# Patient Record
Sex: Male | Born: 1955
Health system: Southern US, Community
[De-identification: ages and names within clinical notes are randomized; demographics above are authoritative.]

## PROBLEM LIST (undated history)

## (undated) DIAGNOSIS — H409 Unspecified glaucoma: Secondary | ICD-10-CM

## (undated) DIAGNOSIS — T7840XA Allergy, unspecified, initial encounter: Secondary | ICD-10-CM

## (undated) DIAGNOSIS — IMO0001 Reserved for inherently not codable concepts without codable children: Secondary | ICD-10-CM

## (undated) DIAGNOSIS — E119 Type 2 diabetes mellitus without complications: Secondary | ICD-10-CM

## (undated) DIAGNOSIS — K219 Gastro-esophageal reflux disease without esophagitis: Secondary | ICD-10-CM

## (undated) DIAGNOSIS — K5792 Diverticulitis of intestine, part unspecified, without perforation or abscess without bleeding: Secondary | ICD-10-CM

## (undated) DIAGNOSIS — K222 Esophageal obstruction: Secondary | ICD-10-CM

## (undated) DIAGNOSIS — F419 Anxiety disorder, unspecified: Secondary | ICD-10-CM

## (undated) DIAGNOSIS — Z8619 Personal history of other infectious and parasitic diseases: Secondary | ICD-10-CM

## (undated) DIAGNOSIS — E739 Lactose intolerance, unspecified: Secondary | ICD-10-CM

## (undated) DIAGNOSIS — G2581 Restless legs syndrome: Secondary | ICD-10-CM

## (undated) DIAGNOSIS — M1990 Unspecified osteoarthritis, unspecified site: Secondary | ICD-10-CM

## (undated) DIAGNOSIS — R51 Headache: Secondary | ICD-10-CM

## (undated) DIAGNOSIS — K760 Fatty (change of) liver, not elsewhere classified: Secondary | ICD-10-CM

## (undated) DIAGNOSIS — I10 Essential (primary) hypertension: Secondary | ICD-10-CM

## (undated) DIAGNOSIS — M542 Cervicalgia: Secondary | ICD-10-CM

## (undated) DIAGNOSIS — J189 Pneumonia, unspecified organism: Secondary | ICD-10-CM

## (undated) DIAGNOSIS — I2510 Atherosclerotic heart disease of native coronary artery without angina pectoris: Secondary | ICD-10-CM

## (undated) DIAGNOSIS — N200 Calculus of kidney: Secondary | ICD-10-CM

## (undated) DIAGNOSIS — I219 Acute myocardial infarction, unspecified: Secondary | ICD-10-CM

## (undated) DIAGNOSIS — E7800 Pure hypercholesterolemia, unspecified: Secondary | ICD-10-CM

## (undated) DIAGNOSIS — H269 Unspecified cataract: Secondary | ICD-10-CM

## (undated) HISTORY — DX: Anxiety disorder, unspecified: F41.9

## (undated) HISTORY — PX: OTHER SURGICAL HISTORY: SHX169

## (undated) HISTORY — PX: UPPER GASTROINTESTINAL ENDOSCOPY: SHX188

## (undated) HISTORY — PX: ESOPHAGEAL DILATION: SHX303

## (undated) HISTORY — PX: VASECTOMY: SHX75

## (undated) HISTORY — PX: INGUINAL HERNIA REPAIR: SUR1180

## (undated) HISTORY — DX: Unspecified glaucoma: H40.9

## (undated) HISTORY — DX: Allergy, unspecified, initial encounter: T78.40XA

## (undated) HISTORY — PX: PILONIDAL CYST EXCISION: SHX744

## (undated) HISTORY — DX: Personal history of other infectious and parasitic diseases: Z86.19

## (undated) HISTORY — DX: Unspecified osteoarthritis, unspecified site: M19.90

## (undated) HISTORY — PX: COLONOSCOPY: SHX174

## (undated) HISTORY — DX: Atherosclerotic heart disease of native coronary artery without angina pectoris: I25.10

## (undated) HISTORY — DX: Essential (primary) hypertension: I10

## (undated) HISTORY — DX: Esophageal obstruction: K22.2

## (undated) HISTORY — DX: Lactose intolerance, unspecified: E73.9

## (undated) HISTORY — DX: Type 2 diabetes mellitus without complications: E11.9

## (undated) HISTORY — DX: Reserved for inherently not codable concepts without codable children: IMO0001

## (undated) HISTORY — DX: Unspecified cataract: H26.9

## (undated) HISTORY — DX: Gastro-esophageal reflux disease without esophagitis: K21.9

## (undated) HISTORY — PX: EYE SURGERY: SHX253

## (undated) HISTORY — DX: Calculus of kidney: N20.0

---

## 1982-04-19 HISTORY — PX: TONSILLECTOMY: SUR1361

## 2001-04-19 HISTORY — PX: CORONARY ANGIOPLASTY: SHX604

## 2001-11-28 ENCOUNTER — Inpatient Hospital Stay (HOSPITAL_COMMUNITY): Admission: AD | Admit: 2001-11-28 | Discharge: 2001-11-29 | Payer: Self-pay | Admitting: Cardiology

## 2005-04-19 HISTORY — PX: POLYPECTOMY: SHX149

## 2005-07-22 ENCOUNTER — Ambulatory Visit: Payer: Self-pay | Admitting: Family Medicine

## 2005-07-29 ENCOUNTER — Ambulatory Visit: Payer: Self-pay | Admitting: Urology

## 2008-04-19 LAB — HM COLONOSCOPY

## 2008-08-15 ENCOUNTER — Inpatient Hospital Stay (HOSPITAL_COMMUNITY): Admission: RE | Admit: 2008-08-15 | Discharge: 2008-08-23 | Payer: Self-pay | Admitting: Cardiovascular Disease

## 2008-08-15 ENCOUNTER — Emergency Department: Payer: Self-pay | Admitting: Internal Medicine

## 2008-08-15 ENCOUNTER — Ambulatory Visit: Payer: Self-pay | Admitting: Cardiovascular Disease

## 2008-08-15 ENCOUNTER — Ambulatory Visit: Payer: Self-pay | Admitting: Thoracic Surgery (Cardiothoracic Vascular Surgery)

## 2008-08-16 HISTORY — PX: CORONARY ARTERY BYPASS GRAFT: SHX141

## 2008-08-17 DIAGNOSIS — IMO0001 Reserved for inherently not codable concepts without codable children: Secondary | ICD-10-CM

## 2008-08-17 HISTORY — DX: Reserved for inherently not codable concepts without codable children: IMO0001

## 2008-08-19 ENCOUNTER — Encounter: Payer: Self-pay | Admitting: Cardiology

## 2008-08-26 ENCOUNTER — Encounter: Payer: Self-pay | Admitting: Cardiovascular Disease

## 2008-08-26 ENCOUNTER — Ambulatory Visit: Payer: Self-pay | Admitting: Thoracic Surgery (Cardiothoracic Vascular Surgery)

## 2008-08-28 ENCOUNTER — Ambulatory Visit: Payer: Self-pay | Admitting: Thoracic Surgery (Cardiothoracic Vascular Surgery)

## 2008-08-28 ENCOUNTER — Encounter: Payer: Self-pay | Admitting: Cardiovascular Disease

## 2008-09-02 ENCOUNTER — Ambulatory Visit: Payer: Self-pay | Admitting: Thoracic Surgery (Cardiothoracic Vascular Surgery)

## 2008-09-03 ENCOUNTER — Encounter: Payer: Self-pay | Admitting: Cardiovascular Disease

## 2008-09-03 ENCOUNTER — Ambulatory Visit: Payer: Self-pay | Admitting: Cardiovascular Disease

## 2008-09-03 DIAGNOSIS — F17200 Nicotine dependence, unspecified, uncomplicated: Secondary | ICD-10-CM | POA: Insufficient documentation

## 2008-09-03 DIAGNOSIS — I25810 Atherosclerosis of coronary artery bypass graft(s) without angina pectoris: Secondary | ICD-10-CM | POA: Insufficient documentation

## 2008-09-03 DIAGNOSIS — E785 Hyperlipidemia, unspecified: Secondary | ICD-10-CM | POA: Insufficient documentation

## 2008-09-03 DIAGNOSIS — F528 Other sexual dysfunction not due to a substance or known physiological condition: Secondary | ICD-10-CM | POA: Insufficient documentation

## 2008-09-03 DIAGNOSIS — E739 Lactose intolerance, unspecified: Secondary | ICD-10-CM | POA: Insufficient documentation

## 2008-09-03 DIAGNOSIS — I2510 Atherosclerotic heart disease of native coronary artery without angina pectoris: Secondary | ICD-10-CM | POA: Insufficient documentation

## 2008-09-03 DIAGNOSIS — I25119 Atherosclerotic heart disease of native coronary artery with unspecified angina pectoris: Secondary | ICD-10-CM | POA: Insufficient documentation

## 2008-09-03 DIAGNOSIS — E1169 Type 2 diabetes mellitus with other specified complication: Secondary | ICD-10-CM | POA: Insufficient documentation

## 2008-09-12 ENCOUNTER — Telehealth: Payer: Self-pay | Admitting: Cardiovascular Disease

## 2008-09-18 ENCOUNTER — Encounter: Payer: Self-pay | Admitting: Cardiovascular Disease

## 2008-09-18 ENCOUNTER — Ambulatory Visit: Payer: Self-pay | Admitting: Thoracic Surgery (Cardiothoracic Vascular Surgery)

## 2008-10-02 ENCOUNTER — Encounter: Payer: Self-pay | Admitting: Cardiovascular Disease

## 2008-11-05 ENCOUNTER — Ambulatory Visit: Payer: Self-pay | Admitting: Cardiology

## 2008-11-06 ENCOUNTER — Telehealth: Payer: Self-pay | Admitting: Cardiovascular Disease

## 2008-11-07 ENCOUNTER — Encounter: Payer: Self-pay | Admitting: Cardiovascular Disease

## 2008-11-07 ENCOUNTER — Ambulatory Visit: Payer: Self-pay | Admitting: Internal Medicine

## 2008-11-12 LAB — CONVERTED CEMR LAB
ALT: 52 units/L (ref 0–53)
AST: 25 units/L (ref 0–37)
Albumin: 4.4 g/dL (ref 3.5–5.2)
Alkaline Phosphatase: 72 units/L (ref 39–117)
Bilirubin, Direct: 0.1 mg/dL (ref 0.0–0.3)
Cholesterol: 129 mg/dL (ref 0–200)
HDL: 29 mg/dL — ABNORMAL LOW (ref >39)
Total Bilirubin: 0.3 mg/dL (ref 0.3–1.2)
Total CHOL/HDL Ratio: 4.4

## 2008-11-29 ENCOUNTER — Encounter
Admission: RE | Admit: 2008-11-29 | Discharge: 2008-11-29 | Payer: Self-pay | Admitting: Thoracic Surgery (Cardiothoracic Vascular Surgery)

## 2008-11-29 ENCOUNTER — Ambulatory Visit: Payer: Self-pay | Admitting: Thoracic Surgery (Cardiothoracic Vascular Surgery)

## 2008-12-03 ENCOUNTER — Encounter: Payer: Self-pay | Admitting: Cardiovascular Disease

## 2008-12-10 ENCOUNTER — Ambulatory Visit: Payer: Self-pay | Admitting: Cardiovascular Disease

## 2008-12-10 DIAGNOSIS — G479 Sleep disorder, unspecified: Secondary | ICD-10-CM

## 2008-12-10 DIAGNOSIS — F39 Unspecified mood [affective] disorder: Secondary | ICD-10-CM | POA: Insufficient documentation

## 2008-12-13 ENCOUNTER — Telehealth: Payer: Self-pay | Admitting: Cardiology

## 2009-01-08 ENCOUNTER — Telehealth (INDEPENDENT_AMBULATORY_CARE_PROVIDER_SITE_OTHER): Payer: Self-pay | Admitting: *Deleted

## 2009-01-13 ENCOUNTER — Encounter: Payer: Self-pay | Admitting: Cardiovascular Disease

## 2009-01-13 ENCOUNTER — Ambulatory Visit: Payer: Self-pay | Admitting: Thoracic Surgery (Cardiothoracic Vascular Surgery)

## 2009-01-13 ENCOUNTER — Encounter
Admission: RE | Admit: 2009-01-13 | Discharge: 2009-01-13 | Payer: Self-pay | Admitting: Thoracic Surgery (Cardiothoracic Vascular Surgery)

## 2009-01-23 LAB — HM COLONOSCOPY

## 2009-03-18 ENCOUNTER — Ambulatory Visit: Payer: Self-pay | Admitting: Cardiovascular Disease

## 2009-03-18 DIAGNOSIS — R079 Chest pain, unspecified: Secondary | ICD-10-CM | POA: Insufficient documentation

## 2009-03-19 ENCOUNTER — Encounter: Payer: Self-pay | Admitting: Cardiovascular Disease

## 2009-03-19 HISTORY — PX: STERNUM SEPARATION REPAIR: SHX393

## 2009-03-27 ENCOUNTER — Encounter: Payer: Self-pay | Admitting: Cardiovascular Disease

## 2009-03-28 ENCOUNTER — Encounter: Payer: Self-pay | Admitting: Cardiovascular Disease

## 2009-03-30 ENCOUNTER — Encounter: Payer: Self-pay | Admitting: Cardiovascular Disease

## 2009-04-01 ENCOUNTER — Telehealth: Payer: Self-pay | Admitting: Cardiovascular Disease

## 2009-04-04 ENCOUNTER — Encounter: Payer: Self-pay | Admitting: Cardiovascular Disease

## 2009-04-28 ENCOUNTER — Ambulatory Visit: Payer: Self-pay | Admitting: Cardiovascular Disease

## 2009-04-28 DIAGNOSIS — I2589 Other forms of chronic ischemic heart disease: Secondary | ICD-10-CM | POA: Insufficient documentation

## 2009-04-28 DIAGNOSIS — I10 Essential (primary) hypertension: Secondary | ICD-10-CM | POA: Insufficient documentation

## 2009-05-05 ENCOUNTER — Telehealth: Payer: Self-pay | Admitting: Cardiovascular Disease

## 2009-05-16 ENCOUNTER — Ambulatory Visit: Payer: Self-pay | Admitting: Cardiology

## 2009-05-16 ENCOUNTER — Encounter: Payer: Self-pay | Admitting: Cardiovascular Disease

## 2009-05-19 ENCOUNTER — Telehealth: Payer: Self-pay | Admitting: Cardiovascular Disease

## 2009-05-19 LAB — CONVERTED CEMR LAB
CO2: 26 meq/L (ref 19–32)
Calcium: 9.1 mg/dL (ref 8.4–10.5)
Creatinine, Ser: 1.2 mg/dL (ref 0.40–1.50)
Glucose, Bld: 102 mg/dL — ABNORMAL HIGH (ref 70–99)

## 2009-05-29 ENCOUNTER — Telehealth: Payer: Self-pay | Admitting: Cardiovascular Disease

## 2009-07-30 ENCOUNTER — Telehealth: Payer: Self-pay | Admitting: Cardiovascular Disease

## 2009-08-08 ENCOUNTER — Emergency Department: Payer: Self-pay | Admitting: Emergency Medicine

## 2009-08-08 ENCOUNTER — Telehealth: Payer: Self-pay | Admitting: Nurse Practitioner

## 2009-08-09 ENCOUNTER — Ambulatory Visit: Payer: Self-pay | Admitting: Internal Medicine

## 2009-08-09 ENCOUNTER — Observation Stay (HOSPITAL_COMMUNITY): Admission: EM | Admit: 2009-08-09 | Discharge: 2009-08-11 | Payer: Self-pay | Admitting: Internal Medicine

## 2009-08-19 ENCOUNTER — Ambulatory Visit: Payer: Self-pay

## 2009-09-09 ENCOUNTER — Ambulatory Visit: Payer: Self-pay | Admitting: Cardiovascular Disease

## 2010-04-22 ENCOUNTER — Encounter: Payer: Self-pay | Admitting: Cardiovascular Disease

## 2010-05-19 NOTE — Letter (Signed)
Summary: [facility]  [facility]   Imported By: Harlon Flor 04/24/2009 10:21:26  _____________________________________________________________________  External Attachment:    Type:   Image     Comment:   External Document

## 2010-05-19 NOTE — Progress Notes (Signed)
Summary: FYI  Phone Note Call from Patient Call back at Home Phone [phone]   Caller: SELF Call For: [facility] Summary of Call: PT TOOK LAST LISINOPRIL TODAY Initial call taken by: Harlon Flor,  May 29, 2009 10:26 AM    New/Updated Medications: COZAAR 50 MG TABS (LOSARTAN POTASSIUM) 1 tab by mouth daily Prescriptions: COZAAR 50 MG TABS (LOSARTAN POTASSIUM) 1 tab by mouth daily  #30 x 6   Entered by:   Charlena Cross, RN, BSN   Authorized by:   Verne Carrow, MD   Signed by:   Charlena Cross, RN, BSN on 05/29/2009   Method used:   Electron[facility] to        [id number]       [address]       [place], [place]  [address]       Ph: [phone]       Fax: [phone]   RxID:   [id number]

## 2010-05-19 NOTE — Progress Notes (Signed)
Summary: QUESTIONS  Phone Note Call from Patient Call back at Home Phone [phone]   Caller: SELF Call For: [facility] Summary of Call: PT PURCHASED MINOXIDIL-IS THIS OK FOR HIM TO TAKE Initial call taken by: Harlon Flor,  July 30, 2009 1:54 PM  Follow-up for Phone Call        per DB- yes he can.  Pt aware.  Follow-up by: Charlena Cross, RN, BSN,  July 30, 2009 3:31 PM

## 2010-05-19 NOTE — Progress Notes (Signed)
Summary: Cardiology Phone Note - "Nervousness in the Chest"  Phone Note Call from Patient   Summary of Call: pts wife called this evening stating that pt c/o of nervous feeling in the chest.  i then spoke with him and he reported that its more like a tightness.  he also says that earlier he felt like he couldn't 'get a good breath.'  when asked if ss now are similar to ss at time of his mi, he says not really in that then he had radiation to his arm and severe c/p.  i advised that because he has ongoing ss, he should be seen in his local ed ([facility]) if for nothing else, reassurance.  he's going to discuss it with his wife and likely go to [facility] tonight. Initial call taken by: Creig Hines, ANP-BC,  August 08, 2009 8:56 PM

## 2010-05-19 NOTE — Op Note (Signed)
Summary: Operative Report  Operative Report   Imported By: Harlon Flor 04/24/2009 10:22:10  _____________________________________________________________________  External Attachment:    Type:   Image     Comment:   External Document

## 2010-05-19 NOTE — Progress Notes (Signed)
Summary: DENTAL WORK  Phone Note Call from Patient Call back at Home Phone [phone]   Caller: SELF Call For: [facility] Summary of Call: DOES PT NEED TO BE PRE MEDICATED FOR A DENTAL CLEANING OR A PERIODONTAL CLEANING Initial call taken by: Harlon Flor,  May 05, 2009 11:23 AM  Follow-up for Phone Call        PT HAS AN APPT TOMORROW WITH AN EYE DOCTOR-IS THERE ANY PROBLEM WITH THE DOCTOR DIALATING HIS EYES? Follow-up by: Harlon Flor,  May 06, 2009 11:45 AM  Additional Follow-up for Phone Call Additional follow up Details #1::        no need for abt prior to dental work Additional Follow-up by: Charlena Cross, RN, BSN,  May 12, 2009 2:32 PM

## 2010-05-19 NOTE — Assessment & Plan Note (Signed)
Summary: F3W/AMD   Visit Type:  Follow-up Primary Provider:  Dorothey Baseman, MD  CC:  chest pain from surgery.  History of Present Illness: 55 yo WM with history of CAD s/p 4V CABG, borderline DM, GERD and nephrolithiasis admitted to [facility] 08/15/08 with an acute anterior STEMI. He presented to the [facility] ED with c/o of chest pain and was found to have anterior ST segment elevation.Emergent cath demonstrated a patent prior stent in the ostial LAD extending into the mid LAD. There was a hazy 99% stenosis in the mid LAD that was felt to be the culprit. PCI failed after multiple attemps and the pt was taken emergently to the operating room for bypass.   Emergent CABG was performed by Dr. Dorris Fetch with 4 vessels bypassed (LIMA to LAD, SVG to D2, SVG to OM2, SVG to distal RCA).  Since surgery, he has had problems with atypical chest pains, sternal wound pain and insomnia.  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 Dorris Fetch. He sought a second opinion at [facility] and underwent sternotomy with removal of sternal wires and revision by Dr. Ty Hilts on 03/28/09. He has done well since then. Pre-operatively at [facility], he had a stress echo which showed segmental LV dysfunction with baseline EF of 40%, no stress induced ichemia. CTA showed 2/4 patent grafts (patent LIMA to LAD and patent SVG to OM. The SVG to RCA was occluded and the SVG to the Diagonal was occluded).    He returns today for follow up.  He has had no recurrence of chest pains since his sternal non-union was treated. He has had no SOB, palpitations or lower ext edema. He feels well at this time.   Current Medications (verified): 1)  Omeprazole 40 Mg Cpdr (Omeprazole) .Marland Kitchen.. 1 Tab Once Daily 2)  Metoprolol Succinate 50 Mg Xr24h-Tab (Metoprolol Succinate) .Marland Kitchen.. 1 Tab Once Daily 3)  Aspirin 81 .... Take One Tablet By Mouth Daily 4)  Pravastatin Sodium 40 Mg Tabs (Pravastatin Sodium) .Marland Kitchen.. 1 Tab At Once Daily 5)   Ibuprofen 200 Mg Tabs (Ibuprofen) .... As Needed 6)  Trazodone Hcl 50 Mg Tabs (Trazodone Hcl) .... One Tab By Mouth At Bedtime As Needed Sleep 7)  Alprazolam 0.5 Mg Tabs (Alprazolam) .... Half of Tab 0.25 8)  Nitroglycerin 0.4 Mg Subl (Nitroglycerin) .... One Tablet Under Tongue Every 5 Minutes As Needed For Chest Pain---May Repeat Times Three 9)  Tums 500 Mg Chew (Calcium Carbonate Antacid) .Marland Kitchen.. 1 Daliy As Needed 10)  Lovaza 1 Gm Caps (Omega-3-Acid Ethyl Esters) .... 3-4  Tabs By Mouth Daily  Allergies (verified): 1)  ! * Ambien 2)  ! Lamisil (Terbinafine Hcl)  Past History:  Past Medical History: CAD s/p emergent 4 V CABG (4/10) with 2/4 patent grafts by CTA at [facility] GLUCOSE INTOLERANCE (ICD-271.3) ERECTILE DYSFUNCTION (ICD-302.72) GERD Nephrolithiasis Schatzkes ring Echo (5/10): EF 60%, apical and apical lateral HK, PASP 43 mmHg.  Repeat at [facility] with reported EF of 40%. Chest pain secondary to sternal non-union post CABG-now s/p sternotomy with repair 03/28/09 at [facility], Dr.                  Ty Hilts HTN  Social History: Reviewed history from 11/05/2008 and no changes required. Full Time Married, 3 children Tobacco Use-history but no smoking since bypass in 4/10 Drug Use - no Etoh used socially  Review of Systems  The patient denies fatigue, malaise, fever, weight gain/loss, vision loss, decreased hearing, hoarseness,  chest pain, palpitations, shortness of breath, prolonged cough, wheezing, sleep apnea, coughing up blood, abdominal pain, blood in stool, nausea, vomiting, diarrhea, heartburn, incontinence, blood in urine, muscle weakness, joint pain, leg swelling, rash, skin lesions, headache, fainting, dizziness, depression, anxiety, enlarged lymph nodes, easy bruising or bleeding, and environmental allergies.    Vital Signs:  Patient profile:   55 year old male Weight:      179.75 pounds Pulse rate:   68 / minute Pulse rhythm:   regular BP sitting:   149 / 90  (left  arm) Cuff size:   regular  Vitals Entered By: Charlena Cross, RN, BSN (April 28, 2009 3:29 PM)  Physical Exam  General:  General: Well developed, well nourished, NAD Musculoskeletal: Muscle strength 5/5 all ext. Chest wall tender to palpation. Sternal wound well healed. Psychiatric: Mood and affect normal Neck: No JVD, no carotid bruits, no thyromegaly, no lymphadenopathy. Lungs:Clear bilaterally, no wheezes, rhonci, crackles CV: RRR no murmurs, gallops rubs Abdomen: soft, NT, ND, BS present Extremities: No edema, pulses 2+.    Impression & Recommendations:  Problem # 1:  CAD, ARTERY BYPASS GRAFT (ICD-414.04) CTA at [facility] with 2/4 patent grafts. The graft to the RCA is down however the RCA only has moderate disease. The graft to the diagonal is also down. He has no angina. Continue present medical therapy.  His updated medication list for this problem includes:    Metoprolol Succinate 50 Mg Xr24h-tab (Metoprolol succinate) .Marland Kitchen... 1 tab once daily    Nitroglycerin 0.4 Mg Subl (Nitroglycerin) ..... One tablet under tongue every 5 minutes as needed for chest pain---may repeat times three    Lisinopril 5 Mg Tabs (Lisinopril) .Marland Kitchen... Take one tablet by mouth daily  His updated medication list for this problem includes:    Metoprolol Succinate 50 Mg Xr24h-tab (Metoprolol succinate) .Marland Kitchen... 1 tab once daily    Nitroglycerin 0.4 Mg Subl (Nitroglycerin) ..... One tablet under tongue every 5 minutes as needed for chest pain---may repeat times three  Problem # 2:  CARDIOMYOPATHY, ISCHEMIC (ICD-414.8) The echo here in May suggested wall motion abnormality with preserved EF but the echo at the outside hospital suggested that the EF was 40%. Will start Lisinopril which will be helpful for his blood pressure as well. Will repeat echo 3-4 months. Volume status ok.   His updated medication list for this problem includes:    Metoprolol Succinate 50 Mg Xr24h-tab (Metoprolol succinate) .Marland Kitchen... 1 tab  once daily    Nitroglycerin 0.4 Mg Subl (Nitroglycerin) ..... One tablet under tongue every 5 minutes as needed for chest pain---may repeat times three    Lisinopril 5 Mg Tabs (Lisinopril) .Marland Kitchen... Take one tablet by mouth daily  Orders: Echocardiogram (Echo)  Problem # 3:  HYPERTENSION, BENIGN (ICD-401.1) Will add Lisinopril. Check BMET two weeks.   His updated medication list for this problem includes:    Metoprolol Succinate 50 Mg Xr24h-tab (Metoprolol succinate) .Marland Kitchen... 1 tab once daily    Lisinopril 5 Mg Tabs (Lisinopril) .Marland Kitchen... Take one tablet by mouth daily  His updated medication list for this problem includes:    Metoprolol Succinate 50 Mg Xr24h-tab (Metoprolol succinate) .Marland Kitchen... 1 tab once daily    Lisinopril 5 Mg Tabs (Lisinopril) .Marland Kitchen... Take one tablet by mouth daily  Patient Instructions: 1)  Your physician recommends that you schedule a follow-up appointment in: 4 months 2)  Your physician recommends that you return for lab work in: 2 weeks (BMET) 3)  Your physician has recommended you make  the following change in your medication: start lisinopril 5 mg daily 4)  Your physician has requested that you have an echocardiogram.  Echocardiography is a painless test that uses sound waves to create images of your heart. It provides your doctor with information about the size and shape of your heart and how well your heart's chambers and valves are working.  This procedure takes approximately one hour. There are no restrictions for this procedure. In 4 months Prescriptions: NITROGLYCERIN 0.4 MG SUBL (NITROGLYCERIN) One tablet under tongue every 5 minutes as needed for chest pain---may repeat times three  #20 x 6   Entered by:   Charlena Cross, RN, BSN   Authorized by:   Verne Carrow, MD   Signed by:   Charlena Cross, RN, BSN on 04/28/2009   Method used:   [facility]        [id number]       [address]       [place], [place]  [address]        Ph: [phone]       Fax: [phone]   RxID:   [id number] LISINOPRIL 5 MG TABS (LISINOPRIL) Take one tablet by mouth daily  #30 x 6   Entered by:   Charlena Cross, RN, BSN   Authorized by:   Verne Carrow, MD   Signed by:   Charlena Cross, RN, BSN on 04/28/2009   Method used:   [facility]ically [facility]        [id number]       [address]       [place], [place]  [address]       Ph: [phone]       Fax: [phone]   RxID:   [id number]

## 2010-05-19 NOTE — Assessment & Plan Note (Signed)
Summary: eph/jml   Visit Type:  Post-hospital Primary Provider:  Dorothey Baseman, MD  CC:  No com plains.  History of Present Illness: 55 yo WM with history of CAD s/p 4V CABG, borderline DM, GERD and nephrolithiasis admitted to [facility] 08/15/08 with an acute anterior STEMI. He presented to the [facility] ED with c/o of chest pain and was found to have anterior ST segment elevation.Emergent cath demonstrated a patent prior stent in the ostial LAD extending into the mid LAD. There was a hazy 99% stenosis in the mid LAD that was felt to be the culprit. PCI failed after multiple attemps and the pt was taken emergently to the operating room for bypass.   Emergent CABG was performed by Dr. Dorris Fetch with 4 vessels bypassed (LIMA to LAD, SVG to D2, SVG to OM2, SVG to distal RCA).  Since surgery, he has had problems with atypical chest pains, sternal wound pain and insomnia.  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 Dorris Fetch. He sought a second opinion at [facility] and underwent sternotomy with removal of sternal wires and revision by Dr. Ty Hilts on 03/28/09. He has done well since then. Pre-operatively at [facility], he had a stress echo which showed segmental LV dysfunction with baseline EF of 40%, no stress induced ichemia. CTA showed 2/4 patent grafts (patent LIMA to LAD and patent SVG to OM. The SVG to RCA was occluded and the SVG to the Diagonal was occluded).   He was admitted to [facility] 08/09/09 to 08/11/09 with chest pain. Cardiac cath repeated and showed patent LIMA to LAD, patent SVG to OM with occluded SVG to Diagonal and SVG to RCA. EF was 45%. End diastolic pressure normal.   He is here today for hospital follow up. He has been doing well. He has been walikng 2 miles per day without chest pain or SOB.   Current Medications (verified): 1)  Omeprazole 40 Mg Cpdr (Omeprazole) .Marland Kitchen.. 1 Tab Once Daily 2)  Metoprolol Succinate 50 Mg Xr24h-Tab (Metoprolol  Succinate) .Marland Kitchen.. 1 Tab Once Daily 3)  Pravastatin Sodium 40 Mg Tabs (Pravastatin Sodium) .Marland Kitchen.. 1 Tab At Once Daily 4)  Ibuprofen 200 Mg Tabs (Ibuprofen) .... As Needed 5)  Trazodone Hcl 50 Mg Tabs (Trazodone Hcl) .... One Tab By Mouth At Bedtime As Needed Sleep 6)  Alprazolam 0.5 Mg Tabs (Alprazolam) .... Half of Tab 0.25 7)  Nitroglycerin 0.4 Mg Subl (Nitroglycerin) .... One Tablet Under Tongue Every 5 Minutes As Needed For Chest Pain---May Repeat Times Three 8)  Tums 500 Mg Chew (Calcium Carbonate Antacid) .Marland Kitchen.. 1 Daliy As Needed 9)  Lovaza 1 Gm Caps (Omega-3-Acid Ethyl Esters) .... 3-4  Tabs By Mouth Daily 10)  Cozaar 50 Mg Tabs (Losartan Potassium) .Marland Kitchen.. 1 Tab By Mouth Daily 11)  Aspirin 81 Mg Tbec (Aspirin) .... Take One Tablet By Mouth Daily 12)  Lemon Balm 99 % Powd (Lemon Balm) .... 800mg  Daily  Allergies: 1)  ! * Ambien 2)  ! Lamisil (Terbinafine Hcl) 3)  ! Lisinopril  Past History:  Past Medical History: CAD s/p emergent 4 V CABG (4/10) with 2/4 patent grafts by cath 08/11/09, patent LIMA to LAD and SVG to OM with moderate disease native RCA and occluded grafts to RCA and Diagonal. GLUCOSE INTOLERANCE (ICD-271.3) ERECTILE DYSFUNCTION (ICD-302.72) GERD Nephrolithiasis Schatzkes ring Echo (5/10): EF 60%, apical and apical lateral HK, PASP 43 mmHg.  Repeat at [facility] with reported EF of 40%. Chest pain secondary  to sternal non-union post CABG-now s/p sternotomy with repair 03/28/09 at [facility], Dr.                  Ty Hilts HTN  Social History: Reviewed history from 11/05/2008 and no changes required. Full Time Married, 3 children Tobacco Use-history but no smoking since bypass in 4/10 Drug Use - no Etoh used socially  Review of Systems  The patient denies fatigue, malaise, fever, weight gain/loss, vision loss, decreased hearing, hoarseness, chest pain, palpitations, shortness of breath, prolonged cough, wheezing, sleep apnea, coughing up blood, abdominal pain, blood in stool,  nausea, vomiting, diarrhea, heartburn, incontinence, blood in urine, muscle weakness, joint pain, leg swelling, rash, skin lesions, headache, fainting, dizziness, depression, anxiety, enlarged lymph nodes, easy bruising or bleeding, and environmental allergies.    Vital Signs:  Patient profile:   55 year old male Height:      70 inches Weight:      179 pounds BMI:     25.78 Pulse rate:   64 / minute Pulse rhythm:   regular Resp:     18 per minute BP sitting:   103 / 63  (right arm) Cuff size:   large  Vitals Entered By: Vikki Ports (Sep 09, 2009 9:08 AM)  Physical Exam  General:  General: Well developed, well nourished, NAD HEENT: OP clear, mucus membranes moist Psychiatric: Mood and affect normal Neck: No JVD, no carotid bruits, no thyromegaly, no lymphadenopathy. Lungs:Clear bilaterally, no wheezes, rhonci, crackles CV: RRR no murmurs, gallops rubs Abdomen: soft, NT, ND, BS present Extremities: No edema, pulses 2+.    Cardiac Cath  Procedure date:  08/11/2009  Findings:       Left main coronary artery had a 20% eccentric stenosis with a patent   stent through the left main and proximal part of the LAD.  The LAD   proper was 100% occluded proximally.  There were some septal   perforators.  Circumflex coronary artery was 100% occluded proximally.      Right coronary artery was dominant.  There was 50% lesion proximally.   This was preceded by an aneurysmal segment.  There was 20-30% multiple   discrete midvessel lesions.  There was a 40% distal tubular lesion prior   to takeoff of the PDA.  PDA and PLA were normal.  There were no right-to-   left collaterals.  The saphenous vein graft to obtuse marginal branch   was widely patent, did appear to feet 3 diffusely diseased branches.   The saphenous vein graft to the RCA and diagonal were injected and known   to be occluded.  The left internal mammary artery was widely patent to   the LAD.   Echocardiogram  Procedure  date:  08/19/2009  Findings:      Left ventricle: The cavity size was normal. Wall thickness was       increased in a pattern of mild LVH. Systolic function was       borderline reduced. The estimated ejection fraction was in the       range of 50% to 55%. There was hypokinesis of the apex, apical       septum, apical anterior wall, and apical lateral wall. Features       are consistent with a pseudonormal left ventricular filling       pattern, with concomitant abnormal relaxation and increased       filling pressure (grade 2 diastolic dysfunction).     - Aortic valve: There  was no stenosis. Trivial regurgitation.     - Mitral valve: Trivial regurgitation.     - Left atrium: The atrium was at the upper limits of normal in size.     - Right ventricle: The cavity size was normal. Systolic function was       normal.     - Pulmonary arteries: PA peak pressure: 35mm Hg (S).     - Inferior vena cava: The vessel was normal in size; the       respirophasic diameter changes were in the normal range (= 50%);       findings are consistent with normal central venous pressure.  EKG  Procedure date:  09/09/2009  Findings:      NSR, rate 65 bpm. Non-specific T wave changes lateral and anterolateral leads.   Impression & Recommendations:  Problem # 1:  CAD, ARTERY BYPASS GRAFT (ICD-414.04) Stable. Continue current therapy. No medication changes. He will use his NTG as needed.   His updated medication list for this problem includes:    Metoprolol Succinate 50 Mg Xr24h-tab (Metoprolol succinate) .Marland Kitchen... 1 tab once daily    Nitroglycerin 0.4 Mg Subl (Nitroglycerin) ..... One tablet under tongue every 5 minutes as needed for chest pain---may repeat times three    Aspirin 81 Mg Tbec (Aspirin) .Marland Kitchen... Take one tablet by mouth daily  Orders: EKG w/ Interpretation (93000)  Patient Instructions: 1)  Your physician recommends that you schedule a follow-up appointment in: 6 months 2)  Your physician  recommends that you continue on your current medications as directed. Please refer to the Current Medication list given to you today.

## 2010-05-19 NOTE — Progress Notes (Signed)
Summary: lisinopril side effects  Phone Note Outgoing Call   Summary of Call: called pt to review labwork. spoke with wife.  wife states that since starting lisinopril, pt has developed a mild cough.  states that pt will only cough 2-3 times a day.  instructed pt that this was a common side effect of the medication and that I would contact you to discuss medication regimine.  wife stated that pt is not bothered by cough but will change medication if you want him to.  Initial call taken by: Charlena Cross, RN, BSN,  May 19, 2009 11:56 AM  Follow-up for Phone Call        His cough may be related to the Lisinopril. I would like for him to continue the Lisinopril until he is almost done with this bottle and if his cough persists, at that time, he should call us and then we  can change him to Cozaar 50 mg by mouth Qdaily. If his cough is related to something viral, it should not persist beyond the next week even on the Lisinopril.  The Cozaar  should not have the same side effect of cough. thanks, cdm Follow-up by: Verne Carrow, MD,  May 19, 2009 12:01 PM  Additional Follow-up for Phone Call Additional follow up Details #1::        pt aware. has 10 days of lisinopril left.  will call to let us know about cough.  Charlena Cross RN BSN

## 2010-06-06 IMAGING — CR DG CHEST 1V PORT
1 series · 1 of 1 positions shown · non-contrast
Comparison: none

REASON FOR EXAM: Chest Pain
COMMENTS:

[view not recorded]
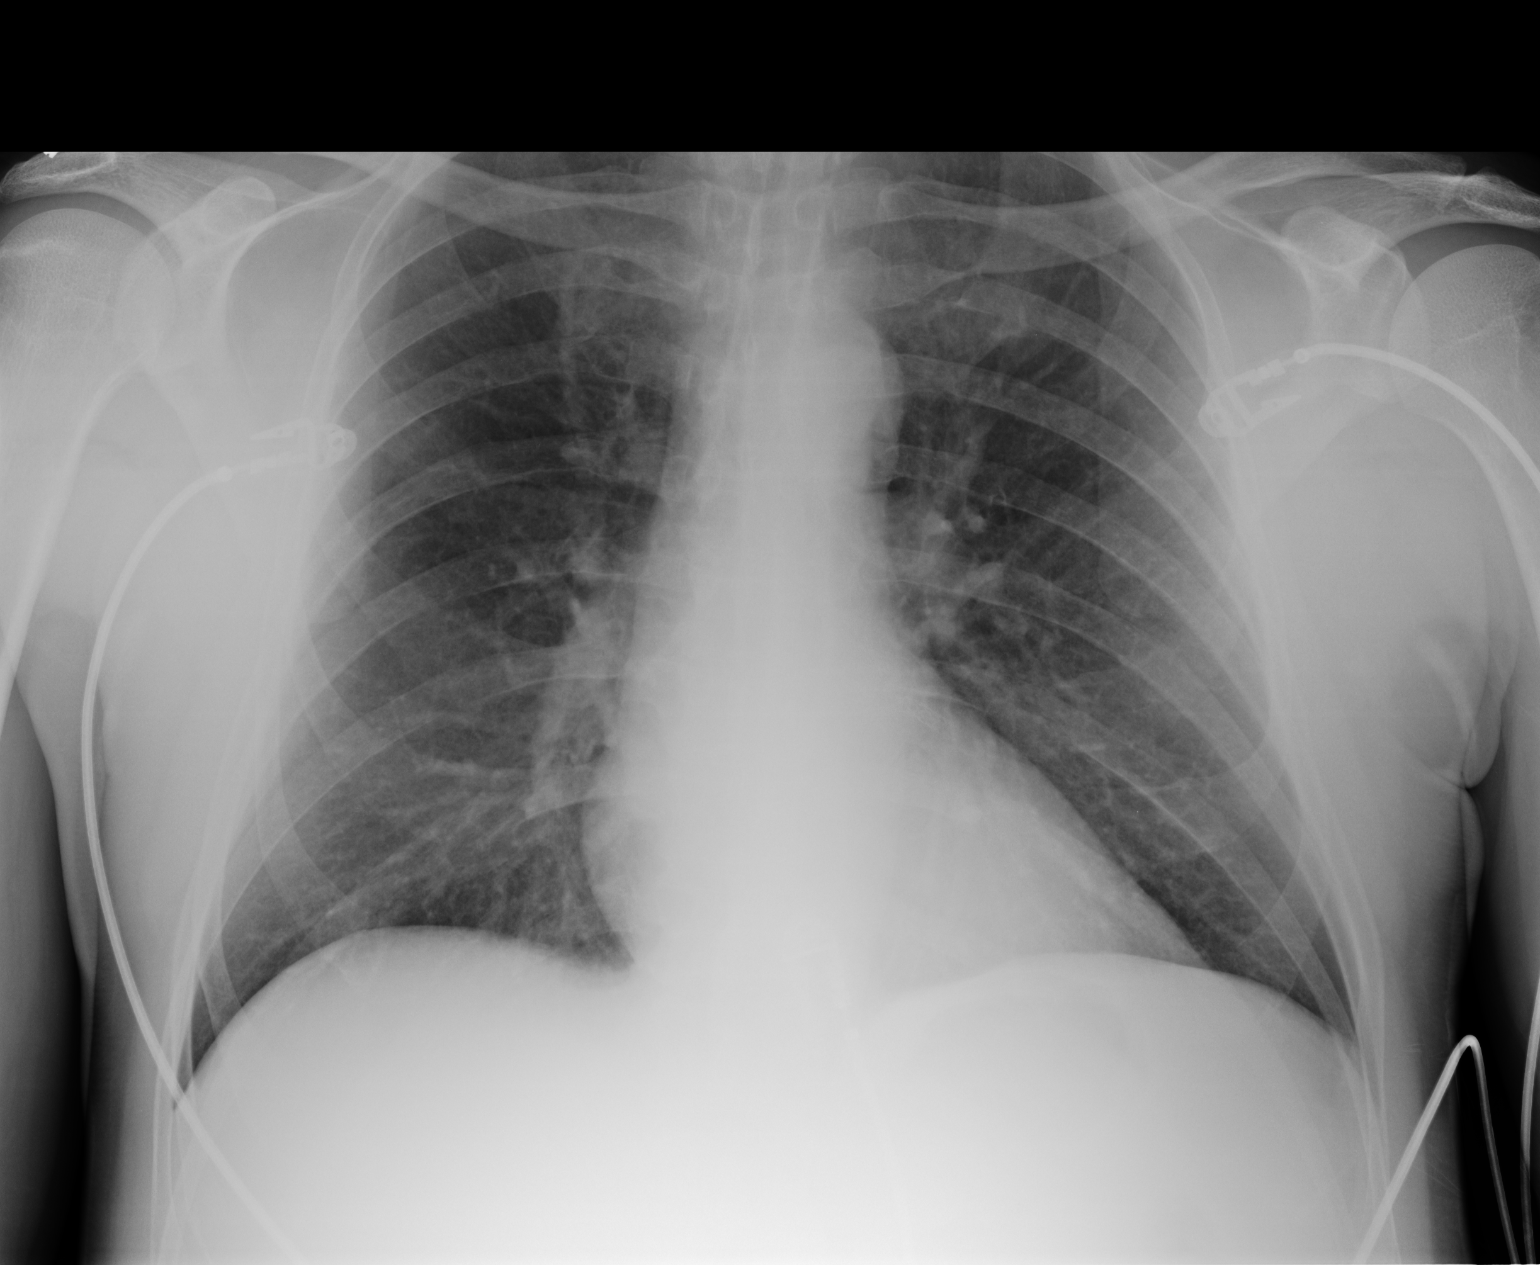

[1 of 1 positions shown; findings below may reference images not displayed]

PROCEDURE:     DXR - DXR PORTABLE CHEST SINGLE VIEW  - August 15, 2008  [DATE]

RESULT:     There is no previous exam for comparison.

Cardiac monitoring electrodes are present. The lungs are clear. The heart
and pulmonary vessels are normal. The bony and mediastinal structures are
unremarkable. There is no effusion. There is no pneumothorax or evidence of
congestive failure.
IMPRESSION: No acute cardiopulmonary disease.

## 2010-06-08 ENCOUNTER — Encounter: Payer: Self-pay | Admitting: Cardiovascular Disease

## 2010-06-08 ENCOUNTER — Ambulatory Visit (INDEPENDENT_AMBULATORY_CARE_PROVIDER_SITE_OTHER): Payer: 59 | Admitting: Cardiovascular Disease

## 2010-06-08 DIAGNOSIS — I25810 Atherosclerosis of coronary artery bypass graft(s) without angina pectoris: Secondary | ICD-10-CM

## 2010-06-08 DIAGNOSIS — E7800 Pure hypercholesterolemia, unspecified: Secondary | ICD-10-CM

## 2010-06-16 NOTE — Assessment & Plan Note (Signed)
Summary: rov.gd /rm per pt wife call. gd /rm   Visit Type:  Follow-up Primary Provider:  Dorothey Baseman, MD  CC:  some chest discomfort. pt has 102.0 fever this morning. body aches and ear ache. coughing.Marland Kitchen  History of Present Illness: 55 yo WM with history of CAD s/p 4V CABG, borderline DM, GERD and nephrolithiasis admitted to [facility] 08/15/08 with an acute anterior STEMI. He presented to the [facility] ED with c/o of chest pain and was found to have anterior ST segment elevation.Emergent cath demonstrated a patent prior stent in the ostial LAD extending into the mid LAD. There was a hazy 99% stenosis in the mid LAD that was felt to be the culprit. PCI failed after multiple attempts and the pt was taken emergently to the operating room for bypass.   Emergent CABG was performed by Dr. Dorris Fetch with 4 vessels bypassed (LIMA to LAD, SVG to D2, SVG to OM2, SVG to distal RCA).  Since surgery, he has had problems with atypical chest pains, sternal wound pain and insomnia.  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 Dorris Fetch. He sought a second opinion at [facility] and underwent sternotomy with removal of sternal wires and revision by Dr. Ty Hilts on 03/28/09. He has done well since then. Pre-operatively at [facility], he had a stress echo which showed segmental LV dysfunction with baseline EF of 40%, no stress induced ichemia. CTA showed 2/4 patent grafts (patent LIMA to LAD and patent SVG to OM. The SVG to RCA was occluded and the SVG to the Diagonal was occluded).   He was admitted to [facility] 08/09/09 to 08/11/09 with chest pain. Cardiac cath repeated and showed patent LIMA to LAD, patent SVG to OM with occluded SVG to Diagonal and SVG to RCA. EF was 45%. End diastolic pressure normal.   He is here today for follow up. He has been doing well. He has some pain in his sternum with movement post sternal repair. No exertional chest pressure. He is back at work. HIs  breathing has been ok. He woke up with a fever today of 102 degrees. No cough. His ear hurt yesterday. He also has lower back pain but no dysuria.   Current Medications (verified): 1)  Omeprazole 40 Mg Cpdr (Omeprazole) .Marland Kitchen.. 1 Tab Once Daily 2)  Metoprolol Succinate 50 Mg Xr24h-Tab (Metoprolol Succinate) .Marland Kitchen.. 1 Tab Once Daily 3)  Pravastatin Sodium 40 Mg Tabs (Pravastatin Sodium) .Marland Kitchen.. 1 Tab At Once Daily 4)  Ibuprofen 200 Mg Tabs (Ibuprofen) .... As Needed 5)  Trazodone Hcl 50 Mg Tabs (Trazodone Hcl) .... One Tab By Mouth At Bedtime As Needed Sleep 6)  Alprazolam 0.5 Mg Tabs (Alprazolam) .... Half of Tab 0.25 7)  Nitroglycerin 0.4 Mg Subl (Nitroglycerin) .... One Tablet Under Tongue Every 5 Minutes As Needed For Chest Pain---May Repeat Times Three 8)  Tums 500 Mg Chew (Calcium Carbonate Antacid) .Marland Kitchen.. 1 Daliy As Needed 9)  Cozaar 50 Mg Tabs (Losartan Potassium) .Marland Kitchen.. 1 Tab By Mouth Daily 10)  Aspirin 81 Mg Tbec (Aspirin) .... Take One Tablet By Mouth Daily 11)  Losartan Potassium 50 Mg Tabs (Losartan Potassium) .Marland Kitchen.. 1 Tab Once Daily 12)  Niaspan 1000 Mg Cr-Tabs (Niacin (Antihyperlipidemic)) .... Take 1 Tablet By Mouth Once A Day 13)  Fish Oil 1000 Mg Caps (Omega-3 Fatty Acids) .... Take 1 Tablet By Mouth Once A Day  Allergies (verified): 1)  ! * Ambien 2)  ! Lamisil (Terbinafine Hcl)  3)  ! Lisinopril  Past History:  Past Medical History: Reviewed history from 09/09/2009 and no changes required. CAD s/p emergent 4 V CABG (4/10) with 2/4 patent grafts by cath 08/11/09, patent LIMA to LAD and SVG to OM with moderate disease native RCA and occluded grafts to RCA and Diagonal. GLUCOSE INTOLERANCE (ICD-271.3) ERECTILE DYSFUNCTION (ICD-302.72) GERD Nephrolithiasis Schatzkes ring Echo (5/10): EF 60%, apical and apical lateral HK, PASP 43 mmHg.  Repeat at [facility] with reported EF of 40%. Chest pain secondary to sternal non-union post CABG-now s/p sternotomy with repair 03/28/09 at [facility], Dr.                   Ty Hilts HTN  Social History: Reviewed history from 11/05/2008 and no changes required. Full Time Married, 3 children Tobacco Use-history but no smoking since bypass in 4/10 Drug Use - no Etoh used socially  Review of Systems       The patient complains of fever and chest pain.  The patient denies anorexia, weight loss, weight gain, vision loss, decreased hearing, hoarseness, syncope, dyspnea on exertion, peripheral edema, prolonged cough, headaches, hemoptysis, abdominal pain, melena, hematochezia, severe indigestion/heartburn, hematuria, incontinence, genital sores, muscle weakness, suspicious skin lesions, transient blindness, difficulty walking, depression, unusual weight change, abnormal bleeding, enlarged lymph nodes, angioedema, breast masses, and testicular masses.    Vital Signs:  Patient profile:   55 year old male Height:      70 inches Weight:      181.50 pounds BMI:     26.14 Pulse rate:   86 / minute Resp:     18 per minute BP sitting:   90 / 70  (left arm) Cuff size:   regular  Vitals Entered By: Celestia Khat, CMA (June 08, 2010 10:07 AM)  Physical Exam  General:  General: Well developed, well nourished, NAD Musculoskeletal: Muscle strength 5/5 all ext Psychiatric: Mood and affect normal Neck: No JVD, no carotid bruits, no thyromegaly, no lymphadenopathy. Lungs:Clear bilaterally, no wheezes, rhonci, crackles CV: RRR no murmurs, gallops rubs Chest wall tender to palpation over sternum. No erythema.  Abdomen: soft, NT, ND, BS present Extremities: No edema, pulses 2+.    EKG  Procedure date:  06/08/2010  Findings:      sinus rhythm, rate 34, Nonspecific T wave abnormalities.   Impression & Recommendations:  Problem # 1:  CAD, ARTERY BYPASS GRAFT (ICD-414.04) Stable. No changes in therapy.   His updated medication list for this problem includes:    Metoprolol Succinate 50 Mg Xr24h-tab (Metoprolol succinate) .Marland Kitchen... 1 tab once daily     Nitroglycerin 0.4 Mg Subl (Nitroglycerin) ..... One tablet under tongue every 5 minutes as needed for chest pain---may repeat times three    Aspirin 81 Mg Tbec (Aspirin) .Marland Kitchen... Take one tablet by mouth daily  Problem # 2:  HYPERTENSION, BENIGN (ICD-401.1) BP well controlled.   His updated medication list for this problem includes:    Metoprolol Succinate 50 Mg Xr24h-tab (Metoprolol succinate) .Marland Kitchen... 1 tab once daily    Cozaar 50 Mg Tabs (Losartan potassium) .Marland Kitchen... 1 tab by mouth daily    Aspirin 81 Mg Tbec (Aspirin) .Marland Kitchen... Take one tablet by mouth daily    Losartan Potassium 50 Mg Tabs (Losartan potassium) .Marland Kitchen... 1 tab once daily  Patient Instructions: 1)  Your physician recommends that you schedule a follow-up appointment in: 6 months

## 2010-07-07 LAB — CARDIAC PANEL(CRET KIN+CKTOT+MB+TROPI)
Relative Index: INVALID (ref 0.0–2.5)
Troponin I: 0.03 ng/mL (ref 0.00–0.06)

## 2010-07-07 LAB — CBC
HCT: 36.3 % — ABNORMAL LOW (ref 39.0–52.0)
HCT: 39.1 % (ref 39.0–52.0)
HCT: 39.5 % (ref 39.0–52.0)
Hemoglobin: 13.5 g/dL (ref 13.0–17.0)
Hemoglobin: 13.8 g/dL (ref 13.0–17.0)
MCHC: 35.2 g/dL (ref 30.0–36.0)
MCHC: 35.6 g/dL (ref 30.0–36.0)
MCV: 88.8 fL (ref 78.0–100.0)
MCV: 89.2 fL (ref 78.0–100.0)
MCV: 89.8 fL (ref 78.0–100.0)
Platelets: 133 10*3/uL — ABNORMAL LOW (ref 150–400)
RBC: 4.36 MIL/uL (ref 4.22–5.81)
RBC: 4.43 MIL/uL (ref 4.22–5.81)
RDW: 14.6 % (ref 11.5–15.5)
WBC: 5.6 10*3/uL (ref 4.0–10.5)

## 2010-07-07 LAB — APTT: aPTT: 76 seconds — ABNORMAL HIGH (ref 24–37)

## 2010-07-07 LAB — BASIC METABOLIC PANEL
BUN: 12 mg/dL (ref 6–23)
Creatinine, Ser: 1.05 mg/dL (ref 0.4–1.5)
GFR calc Af Amer: >60 mL/min (ref >60)
GFR calc non Af Amer: >60 mL/min (ref >60)

## 2010-07-07 LAB — PROTIME-INR
INR: 0.96 (ref 0.00–1.49)
Prothrombin Time: 12.7 seconds (ref 11.6–15.2)

## 2010-07-07 LAB — LIPID PANEL
Cholesterol: 116 mg/dL (ref 0–200)
LDL Cholesterol: 33 mg/dL (ref 0–99)
Total CHOL/HDL Ratio: 4.6 RATIO

## 2010-07-07 LAB — HEPARIN LEVEL (UNFRACTIONATED)
Heparin Unfractionated: 0.29 IU/mL — ABNORMAL LOW (ref 0.30–0.70)
Heparin Unfractionated: <0.1 IU/mL — ABNORMAL LOW (ref 0.30–0.70)

## 2010-07-28 LAB — CBC
HCT: 21.3 % — ABNORMAL LOW (ref 39.0–52.0)
HCT: 21.4 % — ABNORMAL LOW (ref 39.0–52.0)
HCT: 23 % — ABNORMAL LOW (ref 39.0–52.0)
HCT: 23.6 % — ABNORMAL LOW (ref 39.0–52.0)
HCT: 25 % — ABNORMAL LOW (ref 39.0–52.0)
HCT: 26.3 % — ABNORMAL LOW (ref 39.0–52.0)
Hemoglobin: 7.5 g/dL — CL (ref 13.0–17.0)
Hemoglobin: 7.5 g/dL — CL (ref 13.0–17.0)
Hemoglobin: 8.2 g/dL — ABNORMAL LOW (ref 13.0–17.0)
Hemoglobin: 8.4 g/dL — ABNORMAL LOW (ref 13.0–17.0)
Hemoglobin: 8.7 g/dL — ABNORMAL LOW (ref 13.0–17.0)
Hemoglobin: 9.3 g/dL — ABNORMAL LOW (ref 13.0–17.0)
MCHC: 35 g/dL (ref 30.0–36.0)
MCHC: 35.3 g/dL (ref 30.0–36.0)
MCHC: 35.5 g/dL (ref 30.0–36.0)
MCHC: 35.6 g/dL (ref 30.0–36.0)
MCV: 94.5 fL (ref 78.0–100.0)
MCV: 94.6 fL (ref 78.0–100.0)
MCV: 94.8 fL (ref 78.0–100.0)
MCV: 95 fL (ref 78.0–100.0)
MCV: 95.4 fL (ref 78.0–100.0)
MCV: 96.1 fL (ref 78.0–100.0)
Platelets: 133 10*3/uL — ABNORMAL LOW (ref 150–400)
Platelets: 137 10*3/uL — ABNORMAL LOW (ref 150–400)
Platelets: 149 10*3/uL — ABNORMAL LOW (ref 150–400)
Platelets: 156 10*3/uL (ref 150–400)
Platelets: 163 10*3/uL (ref 150–400)
RBC: 2.25 MIL/uL — ABNORMAL LOW (ref 4.22–5.81)
RBC: 2.43 MIL/uL — ABNORMAL LOW (ref 4.22–5.81)
RBC: 2.5 MIL/uL — ABNORMAL LOW (ref 4.22–5.81)
RBC: 2.62 MIL/uL — ABNORMAL LOW (ref 4.22–5.81)
RBC: 2.79 MIL/uL — ABNORMAL LOW (ref 4.22–5.81)
RDW: 13.6 % (ref 11.5–15.5)
RDW: 13.9 % (ref 11.5–15.5)
RDW: 14.1 % (ref 11.5–15.5)
RDW: 14.3 % (ref 11.5–15.5)
WBC: 11.3 10*3/uL — ABNORMAL HIGH (ref 4.0–10.5)
WBC: 13.3 10*3/uL — ABNORMAL HIGH (ref 4.0–10.5)
WBC: 17.7 10*3/uL — ABNORMAL HIGH (ref 4.0–10.5)
WBC: 18.3 10*3/uL — ABNORMAL HIGH (ref 4.0–10.5)
WBC: 18.4 10*3/uL — ABNORMAL HIGH (ref 4.0–10.5)
WBC: 20.6 10*3/uL — ABNORMAL HIGH (ref 4.0–10.5)

## 2010-07-28 LAB — BASIC METABOLIC PANEL
BUN: 20 mg/dL (ref 6–23)
BUN: 26 mg/dL — ABNORMAL HIGH (ref 6–23)
BUN: 30 mg/dL — ABNORMAL HIGH (ref 6–23)
BUN: 30 mg/dL — ABNORMAL HIGH (ref 6–23)
BUN: 32 mg/dL — ABNORMAL HIGH (ref 6–23)
BUN: 33 mg/dL — ABNORMAL HIGH (ref 6–23)
CO2: 25 mEq/L (ref 19–32)
CO2: 26 mEq/L (ref 19–32)
CO2: 27 mEq/L (ref 19–32)
CO2: 27 mEq/L (ref 19–32)
Calcium: 7.9 mg/dL — ABNORMAL LOW (ref 8.4–10.5)
Calcium: 7.9 mg/dL — ABNORMAL LOW (ref 8.4–10.5)
Calcium: 8.1 mg/dL — ABNORMAL LOW (ref 8.4–10.5)
Calcium: 8.3 mg/dL — ABNORMAL LOW (ref 8.4–10.5)
Calcium: 8.3 mg/dL — ABNORMAL LOW (ref 8.4–10.5)
Chloride: 100 mEq/L (ref 96–112)
Chloride: 102 mEq/L (ref 96–112)
Chloride: 102 mEq/L (ref 96–112)
Chloride: 103 mEq/L (ref 96–112)
Chloride: 106 mEq/L (ref 96–112)
Chloride: 107 mEq/L (ref 96–112)
Creatinine, Ser: 1.18 mg/dL (ref 0.4–1.5)
Creatinine, Ser: 1.33 mg/dL (ref 0.4–1.5)
Creatinine, Ser: 1.43 mg/dL (ref 0.4–1.5)
Creatinine, Ser: 1.43 mg/dL (ref 0.4–1.5)
Creatinine, Ser: 1.61 mg/dL — ABNORMAL HIGH (ref 0.4–1.5)
GFR calc Af Amer: 54 mL/min — ABNORMAL LOW (ref >60)
GFR calc Af Amer: >60 mL/min (ref >60)
GFR calc Af Amer: >60 mL/min (ref >60)
GFR calc Af Amer: >60 mL/min (ref >60)
GFR calc Af Amer: >60 mL/min (ref >60)
GFR calc Af Amer: >60 mL/min (ref >60)
GFR calc non Af Amer: 45 mL/min — ABNORMAL LOW (ref >60)
GFR calc non Af Amer: 50 mL/min — ABNORMAL LOW (ref >60)
GFR calc non Af Amer: 52 mL/min — ABNORMAL LOW (ref >60)
GFR calc non Af Amer: 52 mL/min — ABNORMAL LOW (ref >60)
GFR calc non Af Amer: 56 mL/min — ABNORMAL LOW (ref >60)
GFR calc non Af Amer: >60 mL/min (ref >60)
GFR calc non Af Amer: >60 mL/min (ref >60)
Glucose, Bld: 102 mg/dL — ABNORMAL HIGH (ref 70–99)
Glucose, Bld: 138 mg/dL — ABNORMAL HIGH (ref 70–99)
Glucose, Bld: 153 mg/dL — ABNORMAL HIGH (ref 70–99)
Glucose, Bld: 166 mg/dL — ABNORMAL HIGH (ref 70–99)
Potassium: 3.5 mEq/L (ref 3.5–5.1)
Potassium: 3.7 mEq/L (ref 3.5–5.1)
Potassium: 3.8 mEq/L (ref 3.5–5.1)
Potassium: 4.3 mEq/L (ref 3.5–5.1)
Potassium: 4.4 mEq/L (ref 3.5–5.1)
Potassium: 4.4 mEq/L (ref 3.5–5.1)
Sodium: 134 mEq/L — ABNORMAL LOW (ref 135–145)
Sodium: 135 mEq/L (ref 135–145)
Sodium: 136 mEq/L (ref 135–145)
Sodium: 136 mEq/L (ref 135–145)
Sodium: 136 mEq/L (ref 135–145)
Sodium: 137 mEq/L (ref 135–145)
Sodium: 138 mEq/L (ref 135–145)

## 2010-07-28 LAB — GLUCOSE, CAPILLARY
Glucose-Capillary: 103 mg/dL — ABNORMAL HIGH (ref 70–99)
Glucose-Capillary: 108 mg/dL — ABNORMAL HIGH (ref 70–99)
Glucose-Capillary: 114 mg/dL — ABNORMAL HIGH (ref 70–99)
Glucose-Capillary: 136 mg/dL — ABNORMAL HIGH (ref 70–99)
Glucose-Capillary: 141 mg/dL — ABNORMAL HIGH (ref 70–99)
Glucose-Capillary: 152 mg/dL — ABNORMAL HIGH (ref 70–99)
Glucose-Capillary: 169 mg/dL — ABNORMAL HIGH (ref 70–99)
Glucose-Capillary: 87 mg/dL (ref 70–99)
Glucose-Capillary: 87 mg/dL (ref 70–99)
Glucose-Capillary: 95 mg/dL (ref 70–99)

## 2010-07-28 LAB — POCT I-STAT 3, ART BLOOD GAS (G3+)
Acid-base deficit: 5 mmol/L — ABNORMAL HIGH (ref 0.0–2.0)
Bicarbonate: 20.8 mEq/L (ref 20.0–24.0)
O2 Saturation: 89 %
pCO2 arterial: 39.2 mmHg (ref 35.0–45.0)
pO2, Arterial: 59 mmHg — ABNORMAL LOW (ref 80.0–100.0)

## 2010-07-29 LAB — DIFFERENTIAL
Lymphs Abs: 1.3 10*3/uL (ref 0.7–4.0)
Monocytes Relative: 6 % (ref 3–12)
Neutro Abs: 6.2 10*3/uL (ref 1.7–7.7)
Neutrophils Relative %: 76 % (ref 43–77)

## 2010-07-29 LAB — POCT I-STAT 4, (NA,K, GLUC, HGB,HCT)
Glucose, Bld: 117 mg/dL — ABNORMAL HIGH (ref 70–99)
Glucose, Bld: 127 mg/dL — ABNORMAL HIGH (ref 70–99)
Glucose, Bld: 173 mg/dL — ABNORMAL HIGH (ref 70–99)
HCT: 26 % — ABNORMAL LOW (ref 39.0–52.0)
HCT: 26 % — ABNORMAL LOW (ref 39.0–52.0)
HCT: 26 % — ABNORMAL LOW (ref 39.0–52.0)
Hemoglobin: 10.9 g/dL — ABNORMAL LOW (ref 13.0–17.0)
Hemoglobin: 8.8 g/dL — ABNORMAL LOW (ref 13.0–17.0)
Hemoglobin: 8.8 g/dL — ABNORMAL LOW (ref 13.0–17.0)
Hemoglobin: 8.8 g/dL — ABNORMAL LOW (ref 13.0–17.0)
Potassium: 4.6 mEq/L (ref 3.5–5.1)
Sodium: 133 mEq/L — ABNORMAL LOW (ref 135–145)
Sodium: 138 mEq/L (ref 135–145)
Sodium: 140 mEq/L (ref 135–145)

## 2010-07-29 LAB — COMPREHENSIVE METABOLIC PANEL
ALT: 64 U/L — ABNORMAL HIGH (ref 0–53)
Albumin: 4 g/dL (ref 3.5–5.2)
BUN: 12 mg/dL (ref 6–23)
Calcium: 8.4 mg/dL (ref 8.4–10.5)
Glucose, Bld: 143 mg/dL — ABNORMAL HIGH (ref 70–99)
Sodium: 127 mEq/L — ABNORMAL LOW (ref 135–145)
Total Protein: 6.5 g/dL (ref 6.0–8.3)

## 2010-07-29 LAB — CBC
HCT: 24.7 % — ABNORMAL LOW (ref 39.0–52.0)
HCT: 25.7 % — ABNORMAL LOW (ref 39.0–52.0)
Hemoglobin: 11.1 g/dL — ABNORMAL LOW (ref 13.0–17.0)
Hemoglobin: 8.8 g/dL — ABNORMAL LOW (ref 13.0–17.0)
Hemoglobin: 9.2 g/dL — ABNORMAL LOW (ref 13.0–17.0)
MCHC: 35.1 g/dL (ref 30.0–36.0)
MCHC: 35.5 g/dL (ref 30.0–36.0)
MCHC: 35.8 g/dL (ref 30.0–36.0)
MCV: 93.3 fL (ref 78.0–100.0)
MCV: 93.7 fL (ref 78.0–100.0)
MCV: 94 fL (ref 78.0–100.0)
Platelets: 134 10*3/uL — ABNORMAL LOW (ref 150–400)
Platelets: 141 10*3/uL — ABNORMAL LOW (ref 150–400)
RBC: 2.65 MIL/uL — ABNORMAL LOW (ref 4.22–5.81)
RBC: 2.74 MIL/uL — ABNORMAL LOW (ref 4.22–5.81)
RDW: 13.4 % (ref 11.5–15.5)
RDW: 13.4 % (ref 11.5–15.5)
RDW: 13.6 % (ref 11.5–15.5)
WBC: 13.3 10*3/uL — ABNORMAL HIGH (ref 4.0–10.5)

## 2010-07-29 LAB — HEMOGLOBIN A1C: Mean Plasma Glucose: 117 mg/dL

## 2010-07-29 LAB — POCT I-STAT, CHEM 8
BUN: 15 mg/dL (ref 6–23)
Calcium, Ion: 1.19 mmol/L (ref 1.12–1.32)
Chloride: 105 mEq/L (ref 96–112)
Chloride: 106 mEq/L (ref 96–112)
Creatinine, Ser: 1.4 mg/dL (ref 0.4–1.5)
Glucose, Bld: 121 mg/dL — ABNORMAL HIGH (ref 70–99)
Glucose, Bld: 168 mg/dL — ABNORMAL HIGH (ref 70–99)
HCT: 29 % — ABNORMAL LOW (ref 39.0–52.0)
HCT: 41 % (ref 39.0–52.0)
Hemoglobin: 13.9 g/dL (ref 13.0–17.0)
Hemoglobin: 8.2 g/dL — ABNORMAL LOW (ref 13.0–17.0)
Potassium: 3.8 mEq/L (ref 3.5–5.1)
Potassium: 4.5 mEq/L (ref 3.5–5.1)
Sodium: 140 mEq/L (ref 135–145)
Sodium: 140 mEq/L (ref 135–145)
Sodium: 141 mEq/L (ref 135–145)
TCO2: 21 mmol/L (ref 0–100)

## 2010-07-29 LAB — POCT I-STAT GLUCOSE
Glucose, Bld: 134 mg/dL — ABNORMAL HIGH (ref 70–99)
Glucose, Bld: 135 mg/dL — ABNORMAL HIGH (ref 70–99)
Glucose, Bld: 137 mg/dL — ABNORMAL HIGH (ref 70–99)
Operator id: 3342
Operator id: 3342

## 2010-07-29 LAB — CROSSMATCH

## 2010-07-29 LAB — GLUCOSE, CAPILLARY
Glucose-Capillary: 168 mg/dL — ABNORMAL HIGH (ref 70–99)
Glucose-Capillary: 178 mg/dL — ABNORMAL HIGH (ref 70–99)
Glucose-Capillary: 93 mg/dL (ref 70–99)

## 2010-07-29 LAB — POCT I-STAT 3, ART BLOOD GAS (G3+)
Acid-Base Excess: 6 mmol/L — ABNORMAL HIGH (ref 0.0–2.0)
Acid-base deficit: 1 mmol/L (ref 0.0–2.0)
Acid-base deficit: 3 mmol/L — ABNORMAL HIGH (ref 0.0–2.0)
Bicarbonate: 20.6 mEq/L (ref 20.0–24.0)
Bicarbonate: 21.4 mEq/L (ref 20.0–24.0)
Bicarbonate: 25.1 mEq/L — ABNORMAL HIGH (ref 20.0–24.0)
Bicarbonate: 30.2 mEq/L — ABNORMAL HIGH (ref 20.0–24.0)
O2 Saturation: 100 %
O2 Saturation: 79 %
O2 Saturation: 91 %
TCO2: 22 mmol/L (ref 0–100)
TCO2: 22 mmol/L (ref 0–100)
TCO2: 27 mmol/L (ref 0–100)
pCO2 arterial: 35.3 mmHg (ref 35.0–45.0)
pCO2 arterial: 36.6 mmHg (ref 35.0–45.0)
pCO2 arterial: 41.5 mmHg (ref 35.0–45.0)
pCO2 arterial: 44.2 mmHg (ref 35.0–45.0)
pCO2 arterial: 46.4 mmHg — ABNORMAL HIGH (ref 35.0–45.0)
pCO2 arterial: 84.7 mmHg (ref 35.0–45.0)
pH, Arterial: 7.374 (ref 7.350–7.450)
pH, Arterial: 7.38 (ref 7.350–7.450)
pH, Arterial: 7.461 — ABNORMAL HIGH (ref 7.350–7.450)
pO2, Arterial: 242 mmHg — ABNORMAL HIGH (ref 80.0–100.0)
pO2, Arterial: 314 mmHg — ABNORMAL HIGH (ref 80.0–100.0)
pO2, Arterial: 358 mmHg — ABNORMAL HIGH (ref 80.0–100.0)
pO2, Arterial: 45 mmHg — ABNORMAL LOW (ref 80.0–100.0)
pO2, Arterial: 67 mmHg — ABNORMAL LOW (ref 80.0–100.0)

## 2010-07-29 LAB — PROTIME-INR
INR: 1.4 (ref 0.00–1.49)
Prothrombin Time: 18 seconds — ABNORMAL HIGH (ref 11.6–15.2)
Prothrombin Time: 51 seconds — ABNORMAL HIGH (ref 11.6–15.2)

## 2010-07-29 LAB — APTT: aPTT: >200 seconds (ref 24–37)

## 2010-07-29 LAB — CARDIAC PANEL(CRET KIN+CKTOT+MB+TROPI)
CK, MB: 11.2 ng/mL — ABNORMAL HIGH (ref 0.3–4.0)
CK, MB: 254.1 ng/mL — ABNORMAL HIGH (ref 0.3–4.0)
Relative Index: 11.9 — ABNORMAL HIGH (ref 0.0–2.5)
Total CK: 2142 U/L — ABNORMAL HIGH (ref 7–232)
Troponin I: 0.83 ng/mL (ref 0.00–0.06)

## 2010-07-29 LAB — CREATININE, SERUM: GFR calc non Af Amer: 48 mL/min — ABNORMAL LOW (ref >60)

## 2010-07-29 LAB — MAGNESIUM: Magnesium: 2.4 mg/dL (ref 1.5–2.5)

## 2010-07-29 LAB — PLATELET COUNT: Platelets: 160 10*3/uL (ref 150–400)

## 2010-09-01 NOTE — Assessment & Plan Note (Signed)
OFFICE VISIT   LITTLETON, HAUB  DOB:  [birth year]                                        September 18, 2008  CHART #:  [id number]   The patient is a 55 year old gentleman, who had emergency coronary  artery bypass grafting.  On August 15, 2008, he had presented with an  acute anterior wall ST elevation MI complicated by ventricular  fibrillation.  He was taken emergently to the operating room that  evening and had coronary artery bypass grafting x4.  Postoperatively, he  developed a cellulitis in his right leg wound that was treated with  Keflex initially, but switched to Avelox as it did not respond initially  and that subsequently resolved.  The patient states he is still having  some incisional pain mostly in his upper chest and through his shoulders  and neck as well.  He has not had any anginal-type pain.  He is still  taking narcotics 3-4 times a day, usually 1 Vicodin at a time.   PHYSICAL EXAMINATION:  GENERAL:  The patient is a well-appearing 52-year-  old gentleman in no acute distress.  CHEST:  His sternal incision is clean, dry, and intact.  His sternum is  stable.  LUNGS:  Clear with equal breath sounds bilaterally.  CARDIAC:  Regular rate and rhythm.  Normal S1 and S2.  EXTREMITIES:  His leg incisions are healing well.  There was some eschar  at the knee, this was removed.  The underlying tissue is granulating.  There is no evidence of infection.  There is no peripheral edema.   Chest x-ray shows good aeration of the lungs bilaterally.  There is no  effusions or infiltrates.   IMPRESSION:  The patient is doing well at this point in time.  He is now  just over a month out from surgery.  He still has some discomfort.  We  will give him additional prescription for additional #30 Vicodin, but  told him he could also either use Tylenol or ibuprofen instead of the  Vicodin when he is just kind of sore and achy and then use the Vicodin  only when he is  having more severe pain.  I encouraged him to gradually  increase his activities.  He is still not to lift anything over 10  pounds for another 2 weeks.  He may drive, appropriate precautions were  discussed particularly in relation to the narcotics.  At this point, I  think he will be fine to follow up with Dr. Clifton James and Dr. Terance Hart,  but I would be happy to see him back anytime if I could be of any  further assistance with his care.   Salvatore Decent Dorris Fetch, M.D.  Electronically Signed   SCH/MEDQ  D:  09/18/2008  T:  09/19/2008  Job:  [id number]   cc:   Verne Carrow, MD  Dorothey Baseman

## 2010-09-01 NOTE — Op Note (Signed)
NAMEAUDIEL, SCHEIBER                ACCOUNT NO.:  [id number]   MEDICAL RECORD NO.:  [id number]          PATIENT TYPE:  INP   LOCATION:  2313                         FACILITY:  [facility]   PHYSICIAN:  Salvatore Decent. Hendrickson, M.D.DATE OF BIRTH:  10/15/1955   DATE OF PROCEDURE:  08/16/2008  DATE OF DISCHARGE:                               OPERATIVE REPORT   PREOPERATIVE DIAGNOSIS:  Three-vessel coronary artery disease with acute  anterior wall ST elevation myocardial infarction complicated by  ventricular fibrillation.   POSTOPERATIVE DIAGNOSIS:  Three-vessel coronary artery disease with  acute anterior wall ST elevation myocardial infarction complicated by  ventricular fibrillation.   PROCEDURE:  Emergency median sternotomy, extracorporeal circulation,  coronary artery bypass graft x4 (left internal mammary artery to left  anterior descending, saphenous vein graft to second diagonal, saphenous  vein graft to obtuse marginal 2, saphenous vein graft to distal right  coronary), endoscopic vein harvest right leg.   SURGEON:  Salvatore Decent. Dorris Fetch, MD   ASSISTANT:  Coral Ceo, PA   ANESTHESIA:  General.   FINDINGS:  LAD diffusely diseased, second diagonal fair quality, OM2  only graftable lateral branche, distal right with significant plaquing,  but good luminal diameter.  Vein relatively small, but satisfactory, and  mammary good quality.   CLINICAL NOTE:  Mr. Hanf is a 55 year old gentleman with a history of  coronary artery disease with previous LAD stenting.  He presented with  an acute ST elevation anterior wall MI.  In catheterization, he had 99%  stenosis in his mid LAD, had attempts to open this percutaneously were  unsuccessful as I was unable to pass a balloon through the previous  stent.  The patient had an intra-aortic balloon pump placement despite  that was still having ongoing chest pain.  He was advised to undergo  emergency coronary artery bypass grafting.  The  Indications, risks,  benefits, and alternatives were discussed in detail with the patient, he  understood and accepted the risks and agreed to proceed.  On note, prior  to transport to the OR, the patient did have a brief episode of  ventricular fibrillation.  He was treated with defibrillation and  amiodarone.  He subsequently was transported to the operating room.   OPERATIVE NOTE:  Mr. Calk was brought directly into the operating room  on the evening of August 15, 2008.  Lines were placed there by Anesthesia  for arterial blood pressure monitoring as well as a Swan-Ganz catheter  for central venous and pulmonary arterial pressure monitoring.  Initial  cardiac index was greater than 2 liters per minute per meter squared.  The patient was on an intra-aortic balloon pump at one-to-one on arrival  to the operating room.  The patient was anesthetized without  complication.  The chest, abdomen, and legs were prepped and draped in  the usual sterile fashion.   A median sternotomy was performed.  Initial hemostasis was achieved.  Simultaneously, incision was made in the medial aspect of the right leg  at the level of the knee.  The greater saphenous vein was identified was  harvested endoscopically  from midcalf to groin.  After performing the  sternotomy, the patient was heparinized.  The pericardium was opened.  The ascending aorta was inspected.  There was no evidence of  atherosclerotic disease.  The aorta was cannulated via concentric 2-0  Ethibond pledgeted pursestring sutures.  A dual stage venous cannula was  placed via pursestring suture in the right atrial appendage.  After  confirming adequate anticoagulation with ACT measurement,  cardiopulmonary bypass was instituted.   After initiating cardiopulmonary bypass, the left internal mammary  artery then was taken down using standard technique.  It was a good-  quality vessel.   After taking down the internal mammary artery, the  sternal retractor was  replaced.  The coronary arteries were inspected and anastomotic sites  were chosen.  The conduits were inspected, cut to length.  A foam pad  was placed in the pericardium to protect the left phrenic nerve.  A  temperature probe was placed in the myocardial septum and a cardioplegic  cannula was placed in the ascending aorta.  A retrograde cardioplegic  cannula was placed via pursestring suture in the right atrium and  directed to the coronary sinus.   The aorta was crossclamped.  The left ventricle was emptied via the  aortic root vent.  Cardiac arrest was then achieved with combination of  cold antegrade and retrograde blood cardioplegia and topical iced  saline.  Additional 750 mL of cardioplegia was administered antegrade.  There was slow septal cooling.  Another 750 mL of cardioplegia was  administered via the retrograde cannula.  With this, there was more  rapid septal cooling and a complete diastolic arrest.  The following  distal anastomoses were performed.   First, a reverse saphenous vein graft was placed end-to-side to the  distal right coronary.  This was a 2.5-mm diameter vessel, 1.5 mL probe  passed easily to the posterior descending.  There was some lateral  plaquing.  There was a 60% stenosis approximately.  The vein was  anastomosed end-to-side with a running 7-0 Prolene suture.  There was  excellent flow and a good hemostasis with administration of cardioplegia  down to the graft.   Next, a reverse saphenous vein graft was placed end-to-side to the  second diagonal branch of the LAD.  This was a 1.5-mm fair-quality  vessel.  The vein graft was anastomosed end-to-side with a running 7-0  Prolene suture.  The probe passed easily proximally and distally.  There  was good flow and good hemostasis with cardioplegia administration.   Next, a reverse saphenous vein graft was placed end-to-side to obtuse  marginal 2.  This was a 1.5-mm good-quality  vessel.  The other two OMs,  OM 1 and OM 3 were both small, 1-mm vessel was not good targets for  grafting.  The vein graft was anastomosed end-to-side to OM 2 with a  running 7-0 Prolene suture.  Probe passed easily proximally and  distally.  There was satisfactory flow and good hemostasis.   Next, the left internal mammary artery was brought through a window in  the pericardium.  The distal was beveled and distal LAD that was  inspected and was diffusely diseased.  A arteriotomy was made distal to  the takeoff of the diagonal branch, which would be grafted beyond the  tight proximal stenosis, 1.5 mm probe passed for short distance  distally, 1 mm probe did pass to the apex.  There was no other site that  was preferable, this for grafting  as the vessel had diffuse plaquing  throughout its course.  The left internal mammary artery was anastomosed  to the LAD with a running 8-0 Prolene suture.  At the completion of the  anastomosis, the bulldog clamps were briefly removed to inspect for  hemostasis and then was replaced and the mammary pedicle was tacked to  the epicardial surface and the heart with 6-0 Prolene sutures.   While the patient was being rewarmed, the vein grafts were cut to  length, a cardioplegic cannula was removed from the ascending aorta.  The proximal vein graft anastomoses were performed to 4.5-mm punch  aortotomies with running 6-0 Prolene sutures.  At completion of the  final proximal anastomosis, the patient was placed in Trendelenburg  position, lidocaine was administered, a warm dose of 500 mL of  cardioplegia was administered retrograde.  The aortic root was de-aired  and the aortic crossclamp was removed.  Total crossclamp time was 78  minutes.   Air was aspirated from the vein grafts.  A dopamine infusion was  initiated 5 mcg/kg per minute.  After inspecting all proximal and distal  anastomoses for hemostasis, epicardial pacing wires were placed on the  right  ventricle and right atrium.  The patient was in sinus rhythm, was  having frequent PVCs.  The patient weaned from cardiopulmonary bypass  with balloon pump at one-to-one and dopamine at 5 mcg/kg per minute.  Initial cardiac index was greater than 3 liters per minute per meter  squared and the patient maintained good cardiac function throughout the  postbypass period.   A test dose of protamine was administered and was well tolerated.  The  atrial and aortic cannulae were removed.  The remainder of the protamine  was administered without incident.  The chest was irrigated with 1 liter  of warm normal saline and 1 g of vancomycin.  Hemostasis was achieved.  The left pleural and two mediastinal chest tubes were placed with  separate subcostal incisions.  The pericardium was reapproximated with  interrupted 3-0 silk sutures.  It came together easily without tension.  The sternum was closed with interrupted heavy gauge stainless steel  wires.  The pectoralis fascia, subcutaneous tissue, and skin were closed  in standard fashion.  The sponge and instrument counts were correct.  At  the end of the procedure.  There was a missing needle from a 7-0 Prolene  suture which would be looked for the postoperative chest x-ray.  The  patient was transported from the operating room to the surgical  intensive care unit in critical condition.      Salvatore Decent Dorris Fetch, M.D.  Electronically Signed     SCH/MEDQ  D:  08/16/2008  T:  08/17/2008  Job:  [id number]   cc:   Verne Carrow, MD

## 2010-09-01 NOTE — H&P (Signed)
Joseph [name], Joseph [name] NO.:  [id number]   MEDICAL RECORD NO.:  [id number]          PATIENT TYPE:  INP   LOCATION:  2313                         FACILITY:  [facility]   PHYSICIAN:  Christell Faith, MD   DATE OF BIRTH:  06-28-1955   DATE OF ADMISSION:  08/15/2008  DATE OF DISCHARGE:                              HISTORY & PHYSICAL   PRIMARY CARDIOLOGIST:  Colleen Can. Deborah Chalk, MD.   PRIMARY CARE PHYSICIAN:  Dr. Lady Gary in [employer].   CHIEF COMPLAINT:  Chest pain.   HISTORY OF PRESENT ILLNESS:  This is a 55 year old white man with known  coronary artery disease, status post 3.5 x 32-mm stent placed to the  proximal LAD in 2003 by Dr. Deborah Chalk.  He has done well since then  including a recent negative Myoview test until today when he developed  stuttering substernal chest discomfort throughout the day.  It began  approximately 1:30 p.m., not too long after eating lunch, initially  resolved with ibuprofen, but then recurred several hours later, 8/10 in  intensity, radiating to left arm and the jaw.  The patient presented to  [facility] and was found to have anterior ST elevation and  hyperacute T waves and was transferred to [facility] for emergent PCI.  He received unfractionated heparin bolus and 600 mg of Plavix at  [place].  He presently rates his pain 5/10 upon arrival to the cath  lab.   PAST MEDICAL HISTORY:  1. Obstructive coronary artery disease with unstable angina, treated      with 3.5 x 32-mm stent (Cypher) in August 2003.  2. Hyperlipidemia.  3. Erectile dysfunction.  4. Mild glucose intolerance.  5. Tobacco abuse.   SOCIAL HISTORY:  Lives in [place] with his wife.  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  He currently smokes 8  cigarettes a day and he has smoked as many as 2 packs of cigarettes a  day in the past.   FAMILY HISTORY:  Not obtained at this time.   REVIEW OF SYSTEMS:  Otherwise negative.  The patient has  been feeling  well recently.   ALLERGIES:  PERCODAN.   MEDICINES:  1. Aspirin 325 mg p.o. daily.  2. Toprol-XL 25 mg p.o. daily.  3. Pravachol daily.  4. Cialis p.r.n.   PHYSICAL EXAMINATION:  VITAL SIGNS:  Temperature 97, pulse 82,  respiratory rate 14, blood pressure 185/90, saturation 96% on 2 L.  GENERAL:  This is a pleasant white man who appears somewhat older than  his stated age.  He is presently pale and appears uncomfortable  secondary to chest discomfort.  HEENT:  Pupils are round and reactive.  Sclerae are clear.  Mucous  membranes are dry.  NECK:  Supple.  Neck veins are flat.  No carotid bruits.  CARDIAC:  Normal rate, regular rhythm.  No murmurs.  No gallop.  PULMONARY:  Lungs are clear to auscultation bilaterally without wheezing  or rales.  ABDOMEN:  Soft, nontender, nondistended.  EXTREMITIES:  No edema, no cyanosis.  Radial pulses 2+ bilaterally.  Femoral pulses 2+ bilaterally.  MUSCULOSKELETAL:  No acute joint effusions or deformities.  NEUROLOGIC:  Strength 5/5 in all 4 extremities.  Awake, alert, and  oriented x3.  Facial expressions are symmetric and normal.   DIAGNOSTIC TESTS:  EKG shows sinus rhythm at 65 beats per minute with  modest ST elevation in the anterolateral leads of less than 1 mm;  however, with hyperacute T waves consistent with an injury pattern.   LABORATORY DATA:  White blood cell 8.2, hemoglobin 11.1, platelets 141.  Sodium 127, potassium 4, BUN 12, creatinine 0.9, glucose 143, hemoglobin  A1c 5.7, CK-MB 11, troponin 0.8.   IMPRESSION:  This appears to be an early presentation of an acute  anterior myocardial infarction.  The patient was transferred under the  STEMI protocol from [facility].   PLAN:  1. The patient is being taken emergently to cath lab by Dr. Clifton James.      The patient has already received aspirin, Plavix, and heparin      bolus, and will receive bivalirudin in the cath lab.  2. We will continue his  beta-blocker and statin therapy lifelong.  3. Tobacco cessation will be extremely important.  4. He will have his ejection fraction assessed either by      echocardiogram or catheterization.  5. Cardiac rehab referral.  6. We will evaluate his anemia with vitamin B12 level, iron levels,      and reticulocyte count.  7. Hyponatremia, it is probably related to volume status and will be      monitored daily.      Christell Faith, MD  Electronically Signed     NDL/MEDQ  D:  08/16/2008  T:  08/16/2008  Job:  [id number]

## 2010-09-01 NOTE — Assessment & Plan Note (Signed)
OFFICE VISIT   GABRIAN, HOQUE  DOB:  January 09, 1956                                        November 29, 2008  CHART #:  [id number]   The patient is a 55 year old gentleman, who underwent emergent coronary  artery bypass grafting back on August 16, 2008, after he presented with a  ST-elevation MI complicated by V-fib arrest.  He did well  postoperatively.  He has continued to do well.  He was last seen in the  office on September 18, 2008, at which time he was doing well.  He was still  having some incisional pain at that time.  He was still having to take  some narcotics at that time.  He now returns with question regarding his  sternal wound.  He says he feels sometimes that the upper sternum  between his collar bones clicks or pops.  He feels some movement there.  His wife says he sometimes moans when he rolls over in his sleep.  Pain  is not severe.  He has not had to take narcotics or even any  inflammatories for it, but he was just concerned about and wondered if  he was going to injure himself or pull the rest of his sternum apart.   PHYSICAL EXAMINATION:  GENERAL:  The patient is a well-appearing, 38-  year-old gentleman, in no acute distress.  VITAL SIGNS:  Blood pressure is 128/87, pulse 78, respirations 18, and  his oxygen saturation is 98% on room air.  CHEST:  His sternal incision is well healed.  His sternum proper is  stable.  His manubrium is stable, although I can almost appreciate a  gap, it is difficult to tell that it is bony or soft-tissue.  I do not  feel any obvious movement there, but there is a kind of some subtle  movement.   Chest x-ray shows the wires to all be in similar location that they have  been previously.  There is a suggestion, although not definitive that  there may be a slight separation of the heads at the sternum up in the  manubrial area.   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  The body of the  sternum is solid,  well healed.  There is some mild discomfort when I  palpate over the wires, but no clicking or popping.  It is likely that  he has a slight partial nonunion involving the manubrium, that is what  causing his sensation.  I spent a great deal of time with the patient  and his wife discussing this issue.  There really is no danger to him  from this and any treatment would be based on the amount of discomfort  he is having.  He currently is not having enough discomfort to warrant  any surgical intervention.  I did tell him to go ahead and do whatever  he wants to do and not to limit his activities and if the pain is  significant, then we could consider plating the upper part of the  sternum.  He will call if he has any further difficulties or wishes to  be more aggressive.   Salvatore Decent Dorris Fetch, M.D.  Electronically Signed   SCH/MEDQ  D:  11/29/2008  T:  11/30/2008  Job:  [id number]   cc:   Verne Carrow,  MD  Dorothey Baseman

## 2010-09-01 NOTE — Assessment & Plan Note (Signed)
OFFICE VISIT   Joseph [name], Joseph [name]  DOB:  [birth year]                                        Aug 28, 2008  CHART #:  [id number]   The patient returns in followup from his left leg cellulitis.  He was  seen in the office on Monday.  He had been started on Keflex over the  weekend.  Since Monday, the pain in the calf has improved, but he still  has significant redness and there has not been any real change in the  size of that.  He also was noted some fluid along the thigh, but has not  had any redness or significant tenderness in that area.   PHYSICAL EXAMINATION:  VITAL SIGNS:  The patient is afebrile.  Blood  pressure 122/76, pulse 94, respirations are 16, and his oxygen  saturation is 98% on room air.  EXTREMITIES:  The thigh has some hematoma or seroma along the saphenous  venectomy tract.  There is no evidence of infection.  There is  ecchymosis.  There is no erythema, induration, or significant tenderness  on the calf from the markings that were placed Monday.  There has been  no significant decrease in the size of the erythematous area.  It is  less tender than it was.   IMPRESSION:  Cellulitis right leg wound.  He has had somewhat of a  response to Keflex and that his tenderness is less; however, after over  72 hours now, there is a significant decrease in the amount of  cellulitis, therefore we are going to switch him to Avelox 400 mg p.o.  daily.  If in case there is something that is resistant to the Keflex,  we are going to do that for 10 days only.  I will plan to see him back  on Monday to check on the progress of this.  Of course, he knows to call  if  there is any significant worsening in the interim.  I did give him a  prescription for additional #40 oxycodone tablets to take 1-2 three  times daily p.r.n. for pain.   Salvatore Decent Dorris Fetch, M.D.  Electronically Signed   SCH/MEDQ  D:  08/28/2008  T:  08/29/2008  Job:  [id number]   cc:    Verne Carrow, MD  Dorothey Baseman

## 2010-09-01 NOTE — Discharge Summary (Signed)
NAMECARVELL, HOEFFNER                ACCOUNT NO.:  [id number]   MEDICAL RECORD NO.:  [id number]          PATIENT TYPE:  INP   LOCATION:  2028                         FACILITY:  [facility]   PHYSICIAN:  Salvatore Decent. Dorris Fetch, M.D.DATE OF BIRTH:  [birth year]   DATE OF ADMISSION:  08/15/2008  DATE OF DISCHARGE:  08/23/2008                               DISCHARGE SUMMARY   ADDENDUM:   HOSPITAL COURSE:  Once the patient's Ambien was stopped, the patient had  no further confusion.  He was able to sleep better with Lunesta which  was later brought in from home.  He remained afebrile and  hemodynamically stable.  He did have a short run were his heart rate  increased in the 150s to 170 very briefly, questionable SVT.  The  patient was asymptomatic.  He had no further arrhythmias.  He did have  some complaints of his tongue being sore.  He was given magic mouthwash  p.r.n., which did alleviate his tongue soreness.   PHYSICAL EXAMINATION:  CARDIOVASCULAR:  Regular rate and rhythm.  PULMONARY:  Clear to auscultation bilaterally.  No rales, wheezes,  rhonchi.  ABDOMEN:  Soft, nontender.  Bowel sounds present.  EXTREMITIES:  Mild lower extremity edema, right greater than left.  Wounds were clean and dry.   The patient was discharged today on Aug 23, 2008.  The only change to his  previously dictated discharge medications was lisinopril has been  stopped.  In addition, he was instructed if the Nu-Iron makes him  constipated, he can try a stool softener, but if the constipation does  continue, he may stop the Nu-Iron.      Doree Fudge, PA      Viviann Spare C. Dorris Fetch, M.D.  Electronically Signed    DZ/MEDQ  D:  08/23/2008  T:  08/24/2008  Job:  [id number]   cc:   Salvatore Decent. Dorris Fetch, M.D.  Verne Carrow, MD

## 2010-09-01 NOTE — Assessment & Plan Note (Signed)
OFFICE VISIT   NYAIRE, DENBLEYKER  DOB:  04-Nov-1955                                        January 13, 2009  CHART #:  [id number]   HISTORY:  The patient is a 55 year old gentleman who underwent emergent  coronary bypass grafting on August 16, 2008.  He was last seen in the  office on November 29, 2008, at which time he was complaining of some pain  at the incision, particularly in the upper portion of his sternal  incision when he would move his arms in a certain way or turn his  shoulders in a certain way.  It was felt that he might have a nonunion  or at least a partial nonunion at the manubrium and we did discuss our  options.  I encouraged him really to not do anything about it unless it  became very symptomatic or very painful in which case we could consider  plating or rewiring for treatment of that.  He says at that time as  noted that the pain still persists.  He still feel if he move his arms  in a certain way or sometimes if he coughs he will have pain localized  in that upper sternal area.  He says it happens 3-4 times a day.  He is  not taking any narcotics.  He also notes his wife says he moans when he  rolls over in his sleep.   CURRENT MEDICATIONS:  1. Aspirin 325 mg daily.  2. Pravastatin 40 mg daily.  3. Metoprolol 50 mg daily.  4. Ibuprofen p.r.n.  5. Fish oil.  6. Trazodone 50 mg daily.   PHYSICAL EXAMINATION:  The patient is a well-appearing 55 year old  gentleman in no acute distress.  His blood pressure is 128/79, pulse 86,  respirations are 18, his ox saturation is 94% on room air.  The sternal  incision is intact.  I do not feel any movement with the sternum.  When  he moves his shoulders back and forth, I can possibly feel a small gap  in the sternum there but no active movement of bone on bone although he  does feel a grinding sensation.   DIAGNOSTICS:  There are no x-rays today.   IMPRESSION:  The patient is a 55 year old  gentleman who is still having  sternal discomfort about 5 months after his emergent coronary bypass  grafting.  I once again had a long discussion with the patient and his  wife regarding this issue.  There is not any major health implications  associated with his sternal nonunion site.  I am 100% clear that he has  a nonunion.  I am going to order a CT scan to look at the sternum  specifically and see if there is any evidence of nonunion, particularly  superiorly as that could help guide our decision making.  I did  encourage him that as long as he is able to tolerate this and is not  lifestyle altering I would encourage him not to have surgery.  If it  turns out that the bones in fact healed there, it may be as simple as  removing the sternal wires could give him some relief from the pain, but  it is just really not clear whether he has healed that sternum or not  based on  physical exam.  We are going to get a CT scan of the sternum to  assess that area and then we will plan on giving him a call to discuss  the findings.   Salvatore Decent Dorris Fetch, M.D.  Electronically Signed   SCH/MEDQ  D:  01/13/2009  T:  01/14/2009  Job:  [id number]   cc:   Verne Carrow, MD  Dorothey Baseman

## 2010-09-01 NOTE — Discharge Summary (Signed)
Joseph [name], Joseph [name]                ACCOUNT NO.:  [id number]   MEDICAL RECORD NO.:  [id number]          PATIENT TYPE:  INP   LOCATION:  2015                         FACILITY:  [facility]   PHYSICIAN:  Salvatore Decent. Dorris Fetch, M.D.DATE OF BIRTH:  11-Mar-1956   DATE OF ADMISSION:  08/15/2008  DATE OF DISCHARGE:                               DISCHARGE SUMMARY   FINAL DIAGNOSIS:  Three-vessel coronary artery disease with acute  anterior wall ST elevation myocardial infarction complicated by  ventricular fibrillation.   IN-HOSPITAL DIAGNOSES:  1. Postoperative acute blood loss anemia.  2. Volume overload postoperatively.   SECONDARY DIAGNOSES:  1. Obstructive coronary artery disease with unstable angina, treated      with a 3.5 x 32 mm stent Cypher in August 2003.  2. Hyperlipidemia.  3. Erectile dysfunction.  4. Mild glucose intolerance.  5. Tobacco abuse.   IN-HOSPITAL OPERATIONS AND PROCEDURES:  1. Emergent left heart catheterization with selective coronary      angiography.  Attempted percutaneous coronary intervention of a 99%      stenosis in the mid left anterior descending artery.  Placement of      intra-aortic balloon pump in the left femoral artery.  2. Emergency coronary artery bypass grafting x4 using a left internal      mammary artery to left anterior descending, saphenous vein graft to      second diagonal, saphenous vein graft to obtuse marginal 2,      saphenous vein graft to distal right coronary artery.  Endoscopic      vein harvesting from the right leg done.   HISTORY AND PHYSICAL AND HOSPITAL COURSE:  Joseph [name] is a 55 year old  gentleman with a history of coronary artery disease with previous LAD  stenting.  He presented with an acute ST elevation anterior wall  myocardial infarction.  The patient was taken emergently to the cardiac  cath lab for cardiac catheterization on August 15, 2008, where he was  seen to have 99% stenosis in his mid LAD.  Attempts were made  to open  this percutaneously, but were unsuccessful and was unable to pass the  balloon through the previous stent.  The patient had an intra-aortic  balloon pump placed.  Despite this, he continued to have ongoing chest  pain.  Dr. Dorris Fetch was consulted.  Dr. Dorris Fetch discussed with  the patient and family, taken emergently to the operating room for  coronary artery bypass grafting.  He discussed risks and benefits with  the patient.  The patient acknowledged understanding and agreed to  proceed.   For further details of the patient's past medical history and physical  exam, please see dictated H and P.   The patient was taken emergently on August 16, 2008, to the operating  room where he underwent emergency coronary artery bypass grafting x4  using left internal mammary artery to left anterior descending,  saphenous vein graft to second diagonal, saphenous vein graft to obtuse  marginal #2, saphenous vein graft to distal right coronary artery.  Endoscopic vein harvest from the right leg was done.  The patient  tolerated this procedure well and was transferred to the Intensive Care  Unit in stable condition.  Postoperatively, the patient was noted to be  hemodynamically stable.  He remained intubated until evening of surgery.  Postextubation, the patient was noted to be alert and oriented x4.  Following surgery, the patient's intra-aortic balloon pump was weaned  and discontinued on evening of surgery.  He was noted to be in normal  sinus rhythm, although was having frequent PVCs.  Amiodarone was started  IV.  His blood pressure was stable.  Amiodarone and dopamine were able  to be weaned and discontinued.  The patient's blood pressure tolerated.  He remained stable off all drips.  Frequent PVCs resolved.  He remained  in normal sinus rhythm.  He was started on low-dose beta-blocker.  Amiodarone was eventually changed to p.o. and then discontinued.  A 2-D  echocardiogram was  done on Aug 19, 2008, showing overall ejection  fraction of 60%.  He did have hypokinesis of the distal lateral and  apical walls.  Otherwise, normal echo.  The patient's vital signs  continued to be monitored.  His blood pressure noted to be elevating.  His beta-blocker was increased and he was started on an ACE inhibitor.  He tolerated this well.  Postoperatively, chest x-ray done.  This was  noted to be stable.  He had minimum drainage from chest tubes and chest  tubes were discontinued in normal fashion.  Followup chest x-rays showed  some small left pleural effusion.  He was encouraged to continue using  his incentive spirometer.  He was started on diuretics for the effusion  as well as volume overload.  The patient is currently attempting to be  weaned off oxygen.  He is currently sating 98% on 2 L.  Plan is to wean  the patient off oxygen with O2 sats maintaining 98% on room air.  As  stated above, he did have some volume overload postoperatively.  Daily  weights were obtained.  He was continued on diuretics.  This was  improving slowly.  The patient remained 4.8 kg above his preoperative  weight.  Postoperatively, the patient did have some acute blood loss  anemia.  His hemoglobin/hematocrit dropped to 7.5 and 21.4 by postop day  #4.  The patient was asymptomatic.  He was started on p.o. iron.  His H  and H continued to be monitored.  It was repeated following day and  noted to be stable at 7.5 and 21.3.  Plan to continue p.o. iron and  followed.  The patient did not receive any postoperative transfusion.  The patient's creatinine was followed closely postoperatively.  This  noted to remain stable on his ACE inhibitor and Lasix.   The patient was stable and transferred out to PTCU postop day #4.  During his postoperative course, he was up ambulating well with cardiac  rehab.  He was tolerating this well.  He was tolerating diet well.  No  nausea or vomiting noted.  All incisions  were noted to be clean, dry,  and intact and healing well.  On Aug 21, 2008, vital signs, he is noted  to be afebrile.  He is in normal sinus rhythm.  Blood pressure stable.  Again, he is still on 2 L nasal cannula by attempting to be weaned off.  Most recent lab work showed a sodium of 136, potassium 4.4, chloride of  103, bicarbonate 28, BUN of 30, creatinine 1.21, and glucose of 92.  White  blood cell count of 11.3, hemoglobin 7.5, hematocrit 21.3, and  platelet count 172.  Chest x-ray done on May 4 showed bilateral pleural  effusions left greater than right, small in size.  He had stable  interstitial edema.  The patient is tentatively ready for discharge home  in the next 24-48 hours pending stable to be weaned off oxygen and  remained stable.   FOLLOWUP APPOINTMENTS:  A follow up appointment has been arranged with  Dr. Sunday Corn PA for Sep 09, 2008, at 1:30 p.m.  The patient will  need to obtain PA and lateral chest x-ray 30 minutes prior to this  appointment.  The patient will need to follow up with Dr. Clifton James in 2  weeks.  He will need to contact his office to make these arrangements.   ACTIVITY:  The patient is instructed no driving until released to do so,  no lifting over 10 pounds.  He is told to ambulate 3-4 times per day,  progress as tolerated, and continue his breathing exercises.   INCISIONAL CARE:  The patient is told to shower, washing his incisions  using soap and water.  He is to contact the office if he develops any  drainage or opening from any of his incision sites.   DIET:  The patient is educated on diet to be low-fat, low-salt.   DISCHARGE MEDICATIONS:  1. Omeprazole 40 mg daily.  2. Metoprolol 50 mg daily.  3. Aspirin 325 mg daily.  4. Lunesta 3 mg at night.  5. Pravastatin 40 mg at night.  6. Nu-Iron 150 mg daily.  7. Lisinopril 10 mg daily.  8. Lasix 40 mg daily x5 days.  9. Potassium chloride 20 mEq daily x5 days.  10.Oxycodone 5 mg 1-2 tabs  q.4-6 h. p.r.n. pain.      Sol Blazing, PA      Salvatore Decent. Dorris Fetch, M.D.  Electronically Signed    KMD/MEDQ  D:  08/21/2008  T:  08/22/2008  Job:  [id number]   cc:   Salvatore Decent. Dorris Fetch, M.D.

## 2010-09-01 NOTE — Assessment & Plan Note (Signed)
OFFICE VISIT   Joseph [name], Joseph [name]  DOB:  1955/04/21                                        Aug 26, 2008  CHART #:  [id number]   The patient is a 55 year old gentleman, who underwent coronary artery  bypass grafting recently on August 16, 2008.  His postoperative course  was uncomplicated.  He was discharged home on Friday.  He did have a  little redness around his incision in his lower leg when he was sent  home.  He was advised to keep an eye on this.  By yesterday it had  gotten significantly redder and more tender.  He was started on Keflex  by Dr. Tyrone Sage at 500 mg p.o. t.i.d.  He has now just taken his fourth  dose just before he cane to the office this afternoon.  He has not had  any fevers or chills.  His sternal incision is fine.  He is not having  any difficulties with that.   PHYSICAL EXAMINATION:  GENERAL:  The patient is a well-appearing 52-year-  old gentleman in no acute distress.  VITAL SIGNS:  His blood pressure is 117/72, pulse 98, respirations are  16, and his oxygen saturation is 98% on room air.  CHEST:  Sternal incision is clean, dry, and intact.  No signs of  infection.  EXTREMITIES:  He has a cellulitis overlying his right leg medially.  This extends along the saphenous venectomy site from this most inferior  incision to just below the knee.  It is about 10 cm in length and about  6 cm in width.  The edges were marked.   IMPRESSION:  Cellulitis in the vein harvest site.  He does not have  phlebitis because the vein has been resected there, but he may have some  infected fluid in there, it is difficult to tell.  I do not want to try  to aspirate it because I will have go through infected tissue to get to  the fluid.  We are going to continue with the Keflex.  I did mark the  area of cellulitis and there are going to give the Keflex until  Wednesday morning if the wound is not showing signs of improvement by  that  time, they will call  and we will consider changing antibiotics.  If he  has extension of the cellulitis, worsening pain, fevers, or chills, we  will go ahead and admit him for IV antibiotics.   Salvatore Decent Dorris Fetch, M.D.  Electronically Signed   SCH/MEDQ  D:  08/26/2008  T:  08/27/2008  Job:  [id number]   cc:   Verne Carrow, MD  Dorothey Baseman

## 2010-09-01 NOTE — Assessment & Plan Note (Signed)
OFFICE VISIT   Joseph [name], Joseph [name]  DOB:  08/13/1955                                        Sep 02, 2008  CHART #:  [id number]   HISTORY OF PRESENT ILLNESS:  The patient is here for followup visit.  He  had emergency coronary artery bypass grafting on April 29.  He was seen  in the office in [place] last week with a wound infection at his  right leg vein harvest site.  He now returns to have that reevaluated.  He had been started on Keflex initially, but was changed to Avelox as  there was poor initial response to Keflex.  The patient states that over  the past week, he has been feeling much better.  The redness has gone.  The pain is much decreased.  He does have some swelling in his legs.  His wife is very anxious, but he is very calm and feels that he is  getting better every day.  He is still taking oxycodone 3-4 times a day.   PHYSICAL EXAMINATION:  GENERAL:  The patient is a well-appearing 55-year-  old white male in no acute distress.  CHEST:  His sternal wound is clean, dry, intact.  Sternum is stable.  CARDIAC:  Regular rate and rhythm.  Normal S1 and S2.  No rubs, murmurs,  or gallops.  LUNGS:  Clear with equal breath sounds bilaterally.  EXTREMITIES:  His leg, there is some seroma/hematoma in the thigh.  There is no induration or erythema.  There is marked decreased erythema  in the lower leg.  There is still some induration.  There is also some  swelling in the ankle.  There is no calf tenderness and has a negative  Homans' sign.   IMPRESSION:  Resolving cellulitis, right lower leg wound.  The patient  is making good progress at this time.  He is going to complete his  Avelox.  He really has 2 more days after the day of that he will finish  on Wednesday and then I will plan to see him back in 2 weeks to check  on his progress.  I did advise him to elevate that right leg to help  with the swelling.  He otherwise is doing quite well.  We will  plan to  do a chest x-ray when he returns.   Salvatore Decent Dorris Fetch, M.D.  Electronically Signed   SCH/MEDQ  D:  09/02/2008  T:  09/03/2008  Job:  [id number]

## 2010-09-01 NOTE — Cardiovascular Report (Signed)
Joseph [name], Joseph [name]                ACCOUNT NO.:  [id number]   MEDICAL RECORD NO.:  [id number]          PATIENT TYPE:  INP   LOCATION:  2313                         FACILITY:  [facility]   PHYSICIAN:  Verne Carrow, MDDATE OF BIRTH:  Jun 30, 1955   DATE OF PROCEDURE:  08/15/2008  DATE OF DISCHARGE:                            CARDIAC CATHETERIZATION   PRIMARY CARDIOLOGIST:  Iantha Fallen A. Lady Gary, MD   PROCEDURES PERFORMED:  1. Emergent left heart catheterization.  2. Selective coronary angiography.  3. Attempted percutaneous coronary intervention of 99% stenosis in the      mid left anterior descending artery.  4. Placement of an intra-aortic balloon pump in the left femoral      artery.   OPERATOR:  Verne Carrow, MD   INDICATION:  This is a 55 year old Caucasian male with a prior history  of coronary artery disease with placement of a long stent in the ostium  of the left anterior descending artery extending down through the  proximal to midportion of the vessel in 2003 who also has a history of  hypertension and has continued to smoke two packs of cigarettes per day  since his coronary intervention 7 years ago, who presented to the  [facility] at 6:30 p.m. with complaints of  substernal chest pain.  The patient initially was found to have subtle  ST segment elevation.  Subsequent EKGs revealed progression of the  anterior ST segment elevation.  A code STEMI was called to [facility] at 7:20 p.m.  The patient was transported emergently to our  cardiac catheterization laboratory.   PROCEDURE IN DETAIL:  The patient was brought emergently to the cardiac  catheterization laboratory.  The right groin was accessed via the right  femoral artery.  The right groin was prepped and draped in a sterile  fashion prior to access.  Lidocaine 1% was used for local anesthesia.  Standard diagnostic catheters were used to perform selective coronary  angiography.   The culprit lesion was felt to be the mid LAD stenosis of  99% that appeared to be hazy.   At this point, we turned our attention to coronary intervention.  The  patient was given a bolus of Angiomax and was started on a drip.  The  patient had been given 600 mg of Plavix in the [facility].  An XB LAD 3.5 guiding catheter was used to  selectively engage the left main coronary artery.  When the ACT was  greater than 200, a Cougar intracoronary wire was passed without  difficulty down through the proximal and midportion of the LAD and into  the distal portion of the vessel.  I then attempted to pass a fetch  thrombectomy catheter into the midportion of the vessel into the hazy  segment that appeared to be 99% stenosed.  I was unable to pass the  thrombectomy catheter past the left main coronary artery.  I felt that  this was engaging on the prior placed stent.  I then attempted to place  another coronary wire that passed with ease  into the distal LAD.  I once  again attempted to pass the thrombectomy catheter, but was unable to do  so.  At this point, I removed the thrombectomy catheter and attempted to  pass a 2.5 x 20-mm balloon into the mid LAD.  I once again was unable to  pass beyond the left main coronary artery where the prior stent had been  placed.  I felt that my balloons in thrombectomy catheter were likely  colliding with stent struts that were extending into the left main  coronary artery.  At this point, I attempted to pass a 1.5 x 8-mm  balloon down into the vessel, but was unable to do so.  I felt  comfortable that I did not have the wires underneath the stent struts as  the wires both passed easily through the proximal LAD and down into the  distal vessel.  There was clearly no dissection plane that had been  created with initial wire insertion.  Since I was unable to pass any  equipment beyond the left main, I was unable to  place a transit catheter  and exchange out for a long heavy wire.  I felt that my only option to  deliver equipment down the vessel was to place a stiffer wire.  I  attempted to place a PT2 wire, but in the segment of stenosis in the mid  LAD, a dissection plane was created.  The patient began to have ST-  segment elevation and was found to have a dissection extending from the  mid LAD down into the distal LAD.  There was sluggish flow down into the  distal vessel at this point.  I removed the PT2 wire and left the Cougar  wires which I knew were intraluminal down the vessel.  At this point,  the patient began to complain of more chest pain and had significant ST-  segment elevation.  I attempted to pass a small balloon that was  slightly inflated down from the left main into the stented segment, but  I was unable to pass this down the vessel.  I then called Dr. Andrey Spearman from Cardiothoracic Surgery who was gracious and drove into  the hospital to review the case.  An intra-aortic balloon pump was  placed in the left femoral artery.  The patient had some ventricular  ectopy and was given 150 mg bolus of amiodarone.  The films were  reviewed with Dr. Dorris Fetch who agreed to take the patient emergently  to the operating room for at least the left sternal mammary artery  bypass to the LAD.  Angiogram prior to the patient leaving the cath lab  showed that there was some flow into the distal LAD through the  dissected segment.  As we were transporting the patient into the hallway  from the catheterization laboratory, he had a ventricular fibrillation  arrest.  He was brought back into the catheterization laboratory and was  shocked with 360 joules and had prompt return to normal sinus rhythm.  The patient was then taken urgently to the operating room suite.  The  patient's family including his wife and daughter were spoken to and were  brought to the cath lab prior to the patient  being taken to the  operating room.   HEMODYNAMIC FINDINGS:  Central aortic pressure 167/89.   ANGIOGRAPHIC FINDINGS:  1. The left main coronary artery had no significant stenosis.  It did      appear that there  were stent struts in the distal left main      coronary artery.  2. The left anterior descending is a large vessel that courses to the      apex and gives off several small-to-moderate-sized diagonal      branches.  There is a stent that is present in the proximal LAD and      extends clearly back to the ostium.  The stent extends into the      midportion of the LAD.  In the mid LAD just beyond the stented      segment, there is a 50% stenosis followed by a 99% hazy stenosis.      The distal LAD has diffuse plaque disease noted.  The first      diagonal coronary artery is a small-to-moderate-sized vessel that      has 99% stenosis.  The second diagonal artery is a moderate-sized      vessel that has 99% ostial stenosis and fills slowly.  3. The circumflex artery has a 50% proximal stenosis.  The first      obtuse marginal is a very small vessel that has a 99% ostial      stenosis and fills slowly.  The second obtuse marginal is a      moderate-sized vessel that has a 60% stenosis in the proximal      portion.  The third obtuse marginal is a moderate-sized vessel that      has plaque disease.  The fourth obtuse marginal is a small-to-      moderate-sized vessel that has plaque disease.  The AV groove      circumflex is a very small vessel.  4. The right coronary artery is a large dominant vessel that has a 50%      proximal stenosis and serial 40% lesions in the mid and distal      vessel.  The posterior descending artery has plaque disease.  The      posterolateral branch has plaque disease.   IMPRESSION:  1. Acute anterior ST-segment elevation myocardial infarction.  2. Unsuccessful percutaneous coronary intervention of the mid left      anterior descending coronary  artery.  3. Moderate-to-severely obstructive disease in the left circumflex      system.  4. Moderately obstructive disease in the right coronary artery.  5. Need for emergent coronary artery bypass procedure.   COMPLICATIONS:  The patient was transported from [facility] in a timely fashion.  Arterial access was obtained 8  minutes after his arrival in [facility].  The  diagnostic angiogram was performed promptly.  The first device was  passed down the vessel approximately 20 minutes after his arrival in  [facility].  I was unable to deliver a thrombectomy catheter or  balloon secondary to inability to pass beyond the stent struts in the  distal left main coronary artery.  The lesion was not treated in a  percutaneous manner secondary to inability to pass any coronary balloons  or thrombectomy catheters beyond the left main coronary artery in the  stented segment.  The patient was taken for emergent coronary artery  bypass secondary to inability to perform percutaneous procedure.  There  was a distal wire dissection noted in the left anterior descending  coronary artery during the attempted procedure.   RECOMMENDATIONS:  The patient will be taken for an emergent coronary  artery bypass grafting procedure under the care of Dr. Brett Canales  Hendrickson.      Verne Carrow, MD  Electronically Signed     CM/MEDQ  D:  08/15/2008  T:  08/16/2008  Job:  [id number]   cc:   Iantha Fallen A. Lady Gary, M.D.

## 2010-09-04 NOTE — H&P (Signed)
NAME:  Joseph [name], Joseph [name]                          ACCOUNT NO.:  [id number]   MEDICAL RECORD NO.:  [id number]                   PATIENT TYPE:  INP   LOCATION:  2852                                 FACILITY:  [facility]   PHYSICIAN:  Colleen Can. Deborah Chalk, M.D.            DATE OF BIRTH:  04/18/1956   DATE OF ADMISSION:  11/28/2001  DATE OF DISCHARGE:                                HISTORY & PHYSICAL   HISTORY OF PRESENT ILLNESS:  Joseph [name] is a 55 year old male transferred  from [facility] for angioplasty of the proximal left  anterior descending coronary.  He has a history of hyperlipidemia,  hypertension and tobacco abuse but no previous cardiac history.  He was  mowing in his yard on August 9tth and doing light yard work when he felt  chest discomfort with lightheadedness that radiated to his chest and back.  His symptoms lasted for approximately an hour before presenting to the  emergency room.  He had a slightly elevated Troponin level.  He was referred  for catheterization.  His risk factors for coronary disease include  hyperlipidemia, hypertension, tobacco abuse, family history of heart  disease, no diabetes.   PAST MEDICAL HISTORY:  Otherwise unremarkable.   ALLERGIES:  Percodan.   CURRENT MEDICATIONS:  None.   REVIEW OF SYSTEMS:  Basically negative otherwise.  There is no nausea,  vomiting or abdominal pain.   SOCIAL HISTORY:  He works in Chief Financial Officer for [employer].  He  smokes 2 packs of cigarettes a day, he is married.  He formerly was a disk  jockey.   PHYSICAL EXAMINATION:  GENERAL:  On exam he is a pleasant white male.  VITAL SIGNS:  Blood pressure is 120/80, heart rate is in the 80's.  HEENT:  Negative.  LUNGS:  Lungs are clear.  CARDIAC:  Heart shows a regular rate and rhythm.  EXTREMITIES:  Extremities are without edema.  The groin showed ecchymosis  but was satisfactory.   LABORATORY DATA:  Pertinent laboratory showed glucose on  admission at  [facility] was 146, BUN and creatinine were normal, 15 and 1.5.  Potassium  is 3.8.   IMPRESSION:  1. Critical lesion in the left anterior descending coronary artery with     admission for angioplasty.  2.     Multiple cardiovascular risk factors, including history of hyperlipidemia,     hypertension, tobacco abuse and a family history of heart disease.   PLAN:  Proceed on with angioplasty and stent placement to the proximal left  anterior descending.                                                Colleen Can. Deborah Chalk, M.D.    SNT/MEDQ  D:  11/28/2001  T:  12/01/2001  Job:  [id number]   cc:   Renetta Chalk

## 2010-09-04 NOTE — Cardiovascular Report (Signed)
NAME:  Joseph [name], Joseph [name]                          ACCOUNT NO.:  [id number]   MEDICAL RECORD NO.:  [id number]                   PATIENT TYPE:  INP   LOCATION:  2852                                 FACILITY:  [facility]   PHYSICIAN:  Colleen Can. Deborah Chalk, M.D.            DATE OF BIRTH:  18-Apr-1956   DATE OF PROCEDURE:  11/28/2001  DATE OF DISCHARGE:                              CARDIAC CATHETERIZATION   HISTORY:  The patient presented with acute onset of substernal chest pain.  Catheterization demonstrated a severe stenosis in the proximal left anterior  descending with what appeared to be a dissected plaque and moderate  atherosclerosis otherwise.  He was referred for angioplasty.  His  catheterization had shown moderate 50-60% narrowing in the left circumflex  and mild to moderate disease in a dominant right coronary artery.  He had  normal left ventricular function.   PROCEDURE:  Stent placement in the proximal left anterior descending  coronary.   TYPE AND SITE OF ENTRY:  Percutaneous right femoral artery.   CATHETERS:  The #7 [place] 4-curved Judkins left coronary guide catheter, Hi-  Torque Floppy guidewire, a 3.5 x 32-mm Cypher stent.   MEDICATIONS GIVEN PRIOR TO PROCEDURE:  Integrilin, IV nitroglycerin.   MEDICATIONS GIVEN DURING PROCEDURE:  Valium 5 mg IV, Fentanyl 50 mcg IV,  heparin 4000 units IV, and IV nitroglycerin.   COMMENTS:  The patient tolerated the procedure well.   ANGIOPLASTY PROCEDURE:  A JL4 guide was a satisfactory fit in the ostium of  the left coronary artery.  A 3.5 x 32-mm Cypher stent was felt to be  necessary to cover the length of the atherosclerosis proximally.  There was  a moderate stenosis that was present in the proximal left anterior  descending near the ostium and was of concern for the procedure and was felt  it needed to be covered.  The 3.5 stent was positioned.  It had satisfactory  length.  It was inflated to a maximum of 17 atmospheres.  The  Cypher stent  was at, or very near, the ostium of the left anterior descending but was  still felt to not be in the left main coronary artery and was felt to be a  satisfactory placement.  There was no residual stenosis.  The proximal  diagonal vessel remained patent as did a further secondary diagonal.    OVERALL IMPRESSION:  Successful stent placement in the proximal left  anterior descending coronary with a 3.5 x 32-mm Cypher stent.                                                Colleen Can. Deborah Chalk, M.D.    SNT/MEDQ  D:  11/28/2001  T:  12/01/2001  Job:  [id number]   cc:  Mariel Kansky, M.D.   [facility]

## 2010-09-04 NOTE — Discharge Summary (Signed)
NAME:  Joseph [name], Joseph [name]                          ACCOUNT NO.:  [id number]   MEDICAL RECORD NO.:  [id number]                   PATIENT TYPE:  INP   LOCATION:  6522                                 FACILITY:  [facility]   PHYSICIAN:  Juanell Fairly C. Earl Gala, N.P.           DATE OF BIRTH:  Apr 19, 1956   DATE OF ADMISSION:  11/28/2001  DATE OF DISCHARGE:  11/29/2001                                 DISCHARGE SUMMARY   PRIMARY DISCHARGE DIAGNOSIS:  Unstable angina with subsequent percutaneous  coronary intervention:  3.5 x 32 mm cypher stent placement to the proximal  left anterior descending with 0% residual stenosis.   SECONDARY DISCHARGE DIAGNOSES:  1. Post procedural right femoral hematoma.  2. Tobacco abuse.  3. Hyperlipidemia with initiation of stent therapy.  4. Glucose intolerance.   HISTORY OF PRESENT ILLNESS:  The patient is a 55 year old white male who  presented originally this past Saturday to [facility],  where he was seen in consultation by Dr. Iantha Fallen __________.  He was felt to  have had an episode of unstable angina after having substernal chest pain  and lightheadedness while mowing his yard.  He was subsequently admitted and  ruled out for myocardial infarction.  Dr. Iantha Fallen __________ proceeded on  with cardiac catheterization on Monday, August 11, which demonstrated a 60-  70% proximal LAD narrowing followed by a 90% mid lesion.  It was felt that  percutaneous coronary intervention was amenable.  The patient was  subsequently transferred to [facility] the following day to the  service of Dr. Roger Shelter for percutaneous coronary intervention.   HOSPITAL COURSE:  He arrived at our facility.  He was pain free.  Vital  signs were stable.  We proceeded on with elective percutaneous coronary  intervention.  A 3.5 x 32 cypher stent was implanted.  This was inflated to  a maximum of 17 atmospheres to the proximal left anterior descending.  The  80% lesion was subsequently reduced to 0% stenosis.  The proximal edge of  the stent is at the level of the ostium of the left anterior descending.  There was no loss of collateral flow.  Overall procedure was initially  tolerated well without problems.   He was subsequently transferred to 6300.  Post procedure he did receive IV  Integrilin and then subsequently developed significant hematoma of the right  femoral artery.  Manual pressure was applied for a total of 50 minutes.  Subsequent stabilization occurred.  IV Integrilin was discontinued.  The  patient had not yet received his Plavix and this was held until later on  that evening.  Later on, on evening rounds the groin was felt to be stable.  His Plavix was continued.   Today on November 29, 2001, he is doing well except he has right groin  tenderness.  He has had no chest pain.  Blood pressures remained stable.  Heart rate has been somewhat tachycardic.   LABORATORY DATA:  At discharge, he has a hematocrit of 37, platelet count is  141.  Post CK-MB is 5.4.  Chemistries are satisfactory except glucose is  slightly elevated at 121.   Overall he was felt to be a stable candidate for discharge today with plans  for him to follow up with Dr. Iantha Fallen __________ early next week.   DISCHARGE CONDITION:  Stable.   DISCHARGE MEDICATIONS:  1. Lipitor 10 mg a day.  2. Aspirin 325 mg a day.  3. Plavix 75 mg a day.  4. Nitroglycerin p.r.n. chest pain.  5. Toprol XL 25 mg a day.   ACTIVITY:  His activity is to be light.  He will not return to work until he  has been seen back in follow-up next week.   SPECIAL INSTRUCTIONS:  He is to place an ice pack as much as tolerated to  the groin.   He is advised to not smoke.   DIET:  Diet is to be no excessive sugars, as well as heart healthy.   FOLLOW UP:  Will ask him to follow up with Dr. __________ next week and he  is asked to call to schedule that appointment.  He is to see Dr. Deborah Chalk  on  an as needed basis.                                                   Juanell Fairly C. Earl Gala, N.P.    LCO/MEDQ  D:  11/29/2001  T:  12/02/2001  Job:  [id number]   cc:   Cecille Amsterdam

## 2010-10-05 ENCOUNTER — Encounter: Payer: Self-pay | Admitting: Cardiovascular Disease

## 2010-10-08 LAB — HM DIABETES EYE EXAM

## 2010-10-14 ENCOUNTER — Encounter: Payer: Self-pay | Admitting: Cardiovascular Disease

## 2010-11-02 ENCOUNTER — Encounter: Payer: Self-pay | Admitting: Cardiovascular Disease

## 2010-11-05 ENCOUNTER — Encounter: Payer: Self-pay | Admitting: Cardiovascular Disease

## 2010-11-09 ENCOUNTER — Encounter: Payer: Self-pay | Admitting: Cardiovascular Disease

## 2010-11-18 ENCOUNTER — Ambulatory Visit: Payer: Self-pay | Admitting: Family Medicine

## 2010-11-18 ENCOUNTER — Encounter: Payer: Self-pay | Admitting: Cardiovascular Disease

## 2010-11-25 ENCOUNTER — Encounter: Payer: Self-pay | Admitting: Cardiovascular Disease

## 2010-12-07 ENCOUNTER — Encounter: Payer: Self-pay | Admitting: Cardiovascular Disease

## 2010-12-08 ENCOUNTER — Ambulatory Visit (INDEPENDENT_AMBULATORY_CARE_PROVIDER_SITE_OTHER): Payer: 59 | Admitting: Cardiovascular Disease

## 2010-12-08 ENCOUNTER — Encounter: Payer: Self-pay | Admitting: Cardiovascular Disease

## 2010-12-08 VITALS — BP 118/78 | HR 60 | Ht 70.0 in | Wt 183.0 lb

## 2010-12-08 DIAGNOSIS — I2510 Atherosclerotic heart disease of native coronary artery without angina pectoris: Secondary | ICD-10-CM | POA: Insufficient documentation

## 2010-12-08 DIAGNOSIS — R079 Chest pain, unspecified: Secondary | ICD-10-CM | POA: Insufficient documentation

## 2010-12-08 NOTE — Progress Notes (Signed)
History of Present Illness:55 yo WM with history of CAD s/p 4V CABG, borderline DM, GERD and nephrolithiasis admitted to [facility] 08/15/08 with an acute anterior STEMI. He presented to the [facility] ED with c/o of chest pain and was found to have anterior ST segment elevation.Emergent cath demonstrated a patent prior stent in the ostial LAD extending into the mid LAD. There was a hazy 99% stenosis in the mid LAD that was felt to be the culprit. PCI failed after multiple attempts and the pt was taken emergently to the operating room for bypass.   Emergent CABG was performed by Dr. Dorris Fetch with 4 vessels bypassed (LIMA to LAD, SVG to D2, SVG to OM2, SVG to distal RCA).  Since surgery, he has had problems with atypical chest pains, sternal wound pain and insomnia.  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 Dorris Fetch. He sought a second opinion at [facility] and underwent sternotomy with removal of sternal wires and revision by Dr. Ty Hilts on 03/28/09. He has done well since then. Pre-operatively at [facility], he had a stress echo which showed segmental LV dysfunction with baseline EF of 40%, no stress induced ichemia. CTA showed 2/4 patent grafts (patent LIMA to LAD and patent SVG to OM. The SVG to RCA was occluded and the SVG to the Diagonal was occluded).   He was admitted to [facility] 08/09/09 to 08/11/09 with chest pain. Cardiac cath repeated and showed patent LIMA to LAD, patent SVG to OM with occluded SVG to Diagonal and SVG to RCA. EF was 45%. End diastolic pressure normal.   He is here today for follow up. He has been doing well. He has some pain in his sternum with movement post sternal repair. No exertional chest pressure. He is back at work. HIs breathing has been ok. There are times when he feels weak but this is rare.   Past Medical History  Diagnosis Date  . CAD (coronary artery disease)     s/p emergent 4 V CABG (4/10) w 2/4 patent grafts by cath 08/11/09,  patent LIMA to LAD & SVG to OM w moderate disease native RCA & occluded grafts to RCA and diagonal  . 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 Psychosexual dysfunction with inhibited sexual excitement   . GERD (gastroesophageal reflux disease)   . Nephrolithiasis   . Normal echocardiogram 5/10    EF 60, apical & apicallateral HK, PASP . Repeat at [facility] w reported ef of 40%.   . Chest pain     secondary to sternal non-union post CABg-now s/p sternotomy w repair 03/28/09 at [facility], dr. Ty Hilts  . HTN (hypertension)   . 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 Coronary artery bypass graft 08/16/2008    x4  . Tonsillectomy   . Pilonidal sinus tract excision   . Vasectomy     Current Outpatient Prescriptions  Medication Sig Dispense Refill  . ALPRAZolam (XANAX) 0.5 MG tablet Take 0.5 mg by mouth at bedtime as needed. HALF OF TAB .25       . aspirin (ASPIR-81) 81 MG EC tablet Take 81 mg by mouth daily.        . calcium carbonate (TUMS) 500 MG chewable tablet Chew 1 tablet by mouth daily as needed.        . doxycycline (VIBRAMYCIN) 100 MG capsule Take 100 mg by mouth daily.        Marland Kitchen  Ibuprofen 200 MG CAPS Take by mouth as needed.        Marland Kitchen losartan (COZAAR) 50 MG tablet Take 50 mg by mouth daily.        . metoprolol (TOPROL-XL) 50 MG 24 hr tablet Take 50 mg by mouth daily.        . niacin (NIASPAN) 1000 MG CR tablet Take 1,000 mg by mouth daily.        . nitroGLYCERIN (NITROSTAT) 0.4 MG SL tablet Place 0.4 mg under the tongue every 5 (five) minutes as needed.        . Omega-3 Fatty Acids (FISH OIL) 1000 MG CAPS Take by mouth daily.        Marland Kitchen omeprazole (PRILOSEC) 40 MG capsule Take 40 mg by mouth daily.        . pravastatin (PRAVACHOL) 40 MG tablet Take 40 mg by mouth daily.        . traZODone (DESYREL) 50 MG tablet Take 50 mg by mouth. At bedtiem as needed sleep          Allergies  Allergen Reactions  . Lisinopril     REACTION: cough  . Zolpidem Tartrate     History   Social History  . Marital Status: Married    Spouse Name: N/A    Number of Children: 3  . Years of Education: N/A   Occupational History  . Not on file.   Social History Main Topics  . Smoking status: Former Smoker    Quit date: 07/18/2008  . Smokeless tobacco: Not on file   Comment: No smoking since bypass in 4/10  . Alcohol Use: Yes     socially  . Drug Use: No  . Sexually Active: Not on file   Other Topics Concern  . Not on file   Social History Narrative   Fulltime. ETOH sued socially.     Family History  Problem Relation Age of Onset  . Heart failure Mother   . Heart attack Father   . Heart attack Brother   . Heart attack Brother   . Heart attack Sister     Review of Systems:  As stated in the HPI and otherwise negative.   BP 118/78  Pulse 60  Ht 5\' 10"  (1.778 m)  Wt 183 lb (83.008 kg)  BMI 26.26 kg/m2  Physical Examination: General: Well developed, well nourished, NAD HEENT: OP clear, mucus membranes moist SKIN: warm, dry. No rashes. Neuro: No focal deficits Musculoskeletal: Muscle strength 5/5 all ext Psychiatric: Mood and affect normal Neck: No JVD, no carotid bruits, no thyromegaly, no lymphadenopathy. Lungs:Clear bilaterally, no wheezes, rhonci, crackles Cardiovascular: Regular rate and rhythm. No murmurs, gallops or rubs. Abdomen:Soft. Bowel sounds present. Non-tender.  Extremities: No lower extremity edema. Pulses are 2 + in the bilateral DP/PT.

## 2010-12-08 NOTE — Assessment & Plan Note (Signed)
He has CAD s/p CABG. He has dull chest pain and fatigue. Will arrange treadmill stress test to exclude ischemia. Continue current meds.

## 2010-12-08 NOTE — Patient Instructions (Signed)
Your physician has requested that you have an exercise tolerance test. For further information please visit [email or web address] Please also follow instruction sheet, as given with Dr. Clifton James.

## 2010-12-08 NOTE — Assessment & Plan Note (Signed)
See above. Exercise stress test.

## 2010-12-16 ENCOUNTER — Ambulatory Visit (INDEPENDENT_AMBULATORY_CARE_PROVIDER_SITE_OTHER): Payer: 59 | Admitting: Cardiovascular Disease

## 2010-12-16 DIAGNOSIS — I2510 Atherosclerotic heart disease of native coronary artery without angina pectoris: Secondary | ICD-10-CM

## 2010-12-16 NOTE — Progress Notes (Signed)
Exercise Treadmill Test  Pre-Exercise Testing Evaluation Rhythm: normal sinus  Rate: 65   PR:  .16 QRS:  .08  QT:  .39 QTc: .41     Test  Exercise Tolerance Test Ordering MD: Melene Muller, MD  Interpreting MD:  Melene Muller, MD  Unique Test No: 1  Treadmill:  1  Indication for ETT: CAD  Contraindication to ETT: No   Stress Modality: exercise - treadmill  Cardiac Imaging Performed: non   Protocol: standard Bruce - maximal  Max BP:  181/88  Max MPHR (bpm):  163 85% MPR (bpm):  138  MPHR obtained (bpm):  166 % MPHR obtained:  102  Reached 85% MPHR (min:sec):  6:30 Total Exercise Time (min-sec):  7:58  Workload in METS:  10.0 Borg Scale: 15  Reason ETT Terminated:  fatigue    ST Segment Analysis At Rest: normal ST segments - no evidence of significant ST depression With Exercise: no evidence of significant ST depression  Other Information Arrhythmia:  No Angina during ETT:  absent (0) Quality of ETT:  non-diagnostic  ETT Interpretation:  normal - no evidence of ischemia by ST analysis  Comments: Good exercise tolerance. No ischemic EKG changes. No chest pain with exercise.   Recommendations: No further ischemic testing at this time.

## 2011-05-25 ENCOUNTER — Encounter: Payer: Self-pay | Admitting: Cardiovascular Disease

## 2011-05-25 ENCOUNTER — Ambulatory Visit (INDEPENDENT_AMBULATORY_CARE_PROVIDER_SITE_OTHER): Payer: 59 | Admitting: Cardiovascular Disease

## 2011-05-25 VITALS — BP 142/87 | HR 64 | Ht 69.0 in | Wt 184.0 lb

## 2011-05-25 DIAGNOSIS — I2510 Atherosclerotic heart disease of native coronary artery without angina pectoris: Secondary | ICD-10-CM

## 2011-05-25 DIAGNOSIS — I10 Essential (primary) hypertension: Secondary | ICD-10-CM

## 2011-05-25 MED ORDER — NITROGLYCERIN 0.4 MG SL SUBL: 0.4000 mg | SUBLINGUAL_TABLET | SUBLINGUAL | Status: DC | PRN

## 2011-05-25 NOTE — Assessment & Plan Note (Signed)
BP is well controlled at home.

## 2011-05-25 NOTE — Patient Instructions (Signed)
Your physician wants you to follow-up in: 6 months  You will receive a reminder letter in the mail two months in advance. If you don't receive a letter, please call our office to schedule the follow-up appointment.  Your physician recommends that you continue on your current medications as directed. Please refer to the Current Medication list given to you today.  

## 2011-05-25 NOTE — Progress Notes (Signed)
History of Present Illness: 56 yo WM with history of CAD s/p 4V CABG, borderline DM, GERD and nephrolithiasis admitted to [facility] 08/15/08 with an acute anterior STEMI. He presented to the [facility] ED with c/o of chest pain and was found to have anterior ST segment elevation.Emergent cath demonstrated a patent prior stent in the ostial LAD extending into the mid LAD. There was a hazy 99% stenosis in the mid LAD that was felt to be the culprit. PCI failed after multiple attempts and the pt was taken emergently to the operating room for bypass. Emergent CABG was performed by Dr. Dorris Fetch with 4 vessels bypassed (LIMA to LAD, SVG to D2, SVG to OM2, SVG to distal RCA). After surgery, he had problems with chest wall pain and went to [facility] for evaluation by CT surgery. He underwent sternotomy with removal of sternal wires and revision by Dr. Ty Hilts on 03/28/09. He has done well since then. Pre-operatively at [facility], he had a stress echo which showed segmental LV dysfunction with baseline EF of 40%, no stress induced ichemia. CTA showed 2/4 patent grafts (patent LIMA to LAD and patent SVG to OM. The SVG to RCA was occluded and the SVG to the Diagonal was occluded). He was admitted to [facility] 08/09/09 to 08/11/09 with chest pain. Cardiac cath repeated and showed patent LIMA to LAD, patent SVG to OM with occluded SVG to Diagonal and SVG to RCA. EF was 45%.   He is here today for follow up. He has been doing well. He has some pain in his sternum with movement post sternal repair. He also has soreness across his shoulders.  No exertional chest pressure. He is back at work. HIs breathing has been ok. He has minimal dyspnea with exertion. Exercise treadmill stress test August 2012 with no EKG changes with exercise. BP is well controlled at home.   His primary care is Dr. Dorothey Baseman at [facility]. Lipids are followed in primary care and have been well controlled.    Past  Medical History  Diagnosis Date  . CAD (coronary artery disease)     s/p emergent 4 V CABG (4/10) w 2/4 patent grafts by cath 08/11/09, patent LIMA to LAD & SVG to OM w moderate disease native RCA & occluded grafts to RCA and diagonal  . 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 Psychosexual dysfunction with inhibited sexual excitement   . GERD (gastroesophageal reflux disease)   . Nephrolithiasis   . Normal echocardiogram 5/10    EF 60, apical & apicallateral HK, PASP . Repeat at [facility] w reported ef of 40%.   . Chest pain     secondary to sternal non-union post CABg-now s/p sternotomy w repair 03/28/09 at [facility], dr. Ty Hilts  . HTN (hypertension)   . 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 Coronary artery bypass graft 08/16/2008    x4  . Tonsillectomy   . Pilonidal sinus tract excision   . Vasectomy     Current Outpatient Prescriptions  Medication Sig Dispense Refill  . ALPRAZolam (XANAX) 0.5 MG tablet Take 0.5 mg by mouth at bedtime as needed. HALF OF TAB .25       . aspirin (ASPIR-81) 81 MG EC tablet Take 81 mg by mouth daily.        . calcium carbonate (TUMS) 500 MG chewable tablet Chew 1 tablet by mouth daily as needed.        Marland Kitchen  doxycycline (VIBRAMYCIN) 100 MG capsule Take 100 mg by mouth daily.        . Ibuprofen 200 MG CAPS Take by mouth as needed.        Marland Kitchen losartan (COZAAR) 50 MG tablet Take 50 mg by mouth daily.        . metoprolol (TOPROL-XL) 50 MG 24 hr tablet Take 50 mg by mouth daily.        . niacin (NIASPAN) 1000 MG CR tablet Take 1,000 mg by mouth daily.        . nitroGLYCERIN (NITROSTAT) 0.4 MG SL tablet Place 0.4 mg under the tongue every 5 (five) minutes as needed.        . Omega-3 Fatty Acids (FISH OIL) 1000 MG CAPS Take by mouth daily.        Marland Kitchen omeprazole (PRILOSEC) 40 MG capsule Take 40 mg by mouth daily.        . pravastatin (PRAVACHOL) 40 MG  tablet Take 40 mg by mouth daily.        . traZODone (DESYREL) 50 MG tablet Take 50 mg by mouth. At bedtiem as needed sleep         Allergies  Allergen Reactions  . Lisinopril     REACTION: cough  . Zolpidem Tartrate     History   Social History  . Marital Status: Married    Spouse Name: N/A    Number of Children: 3  . Years of Education: N/A   Occupational History  . Not on file.   Social History Main Topics  . Smoking status: Former Smoker    Quit date: 07/18/2008  . Smokeless tobacco: Not on file   Comment: No smoking since bypass in 4/10  . Alcohol Use: Yes     socially  . Drug Use: No  . Sexually Active: Not on file   Other Topics Concern  . Not on file   Social History Narrative   Fulltime. ETOH sued socially.     Family History  Problem Relation Age of Onset  . Heart failure Mother   . Heart attack Father   . Heart attack Brother   . Heart attack Brother   . Heart attack Sister     Review of Systems:  As stated in the HPI and otherwise negative.   BP 142/87  Pulse 64  Ht 5\' 9"  (1.753 m)  Wt 184 lb (83.462 kg)  BMI 27.17 kg/m2  Physical Examination: General: Well developed, well nourished, NAD HEENT: OP clear, mucus membranes moist SKIN: warm, dry. No rashes. Neuro: No focal deficits Musculoskeletal: Muscle strength 5/5 all ext Psychiatric: Mood and affect normal Neck: No JVD, no carotid bruits, no thyromegaly, no lymphadenopathy. Lungs:Clear bilaterally, no wheezes, rhonci, crackles Cardiovascular: Regular rate and rhythm. No murmurs, gallops or rubs. Abdomen:Soft. Bowel sounds present. Non-tender.  Extremities: No lower extremity edema. Pulses are 2 + in the bilateral DP/PT.

## 2011-05-25 NOTE — Assessment & Plan Note (Addendum)
Stable. Continue current therapy. No changes. BP is well controlled at home. Lipids are well controlled. Treadmill stress test in August 2012 with no EKG changes with exercise.

## 2011-06-07 LAB — HM DIABETES EYE EXAM

## 2011-11-23 ENCOUNTER — Ambulatory Visit (INDEPENDENT_AMBULATORY_CARE_PROVIDER_SITE_OTHER): Payer: BC Managed Care – PPO | Admitting: Cardiovascular Disease

## 2011-11-23 ENCOUNTER — Encounter: Payer: Self-pay | Admitting: Cardiovascular Disease

## 2011-11-23 VITALS — BP 122/88 | HR 60 | Ht 69.0 in | Wt 179.0 lb

## 2011-11-23 DIAGNOSIS — I2510 Atherosclerotic heart disease of native coronary artery without angina pectoris: Secondary | ICD-10-CM

## 2011-11-23 NOTE — Patient Instructions (Addendum)
Your physician wants you to follow-up in:  12 months.  You will receive a reminder letter in the mail two months in advance. If you don't receive a letter, please call our office to schedule the follow-up appointment.   

## 2011-11-23 NOTE — Assessment & Plan Note (Addendum)
Stable. He is doing well. Will continue current meds. BP and Lipids well controlled.

## 2011-11-23 NOTE — Progress Notes (Signed)
History of Present Illness: 56 yo WM with history of CAD s/p 4V CABG, borderline DM, GERD and nephrolithiasis admitted to [facility] 08/15/08 with an acute anterior STEMI. He presented to the [facility] ED with c/o of chest pain and was found to have anterior ST segment elevation.Emergent cath demonstrated a patent prior stent in the ostial LAD extending into the mid LAD. There was a hazy 99% stenosis in the mid LAD that was felt to be the culprit. PCI failed after multiple attempts and the pt was taken emergently to the operating room for bypass. Emergent CABG was performed by Dr. Dorris Fetch with 4 vessels bypassed (LIMA to LAD, SVG to D2, SVG to OM2, SVG to distal RCA). After surgery, he had problems with chest wall pain and went to [facility] for evaluation by CT surgery. He underwent sternotomy with removal of sternal wires and revision by Dr. Ty Hilts on 03/28/09. He has done well since then. Pre-operatively at [facility], he had a stress echo which showed segmental LV dysfunction with baseline EF of 40%, no stress induced ichemia. CTA showed 2/4 patent grafts (patent LIMA to LAD and patent SVG to OM. The SVG to RCA was occluded and the SVG to the Diagonal was occluded). He was admitted to [facility] 08/09/09 to 08/11/09 with chest pain. Cardiac cath repeated and showed patent LIMA to LAD, patent SVG to OM with occluded SVG to Diagonal and SVG to RCA. EF was 45%.   He is here today for follow up. He has been doing well. He has some pain in his sternum with movement post sternal repair. He also has soreness across his shoulders. No exertional chest pressure. He is back at work. HIs breathing has been ok. He has minimal dyspnea with exertion. Exercise treadmill stress test August 2012 with no EKG changes with exercise. BP is well controlled at home.   His primary care is Dr. Dorothey Baseman at [facility]. Lipids are followed in primary care and have been well controlled.   Past  Medical History  Diagnosis Date  . CAD (coronary artery disease)     s/p emergent 4 V CABG (4/10) w 2/4 patent grafts by cath 08/11/09, patent LIMA to LAD & SVG to OM w moderate disease native RCA & occluded grafts to RCA and diagonal  . 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 Psychosexual dysfunction with inhibited sexual excitement   . GERD (gastroesophageal reflux disease)   . Nephrolithiasis   . Normal echocardiogram 5/10    EF 60, apical & apicallateral HK, PASP . Repeat at [facility] w reported ef of 40%.   . Chest pain     secondary to sternal non-union post CABg-now s/p sternotomy w repair 03/28/09 at [facility] dr. Ty Hilts  . HTN (hypertension)   . 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 Coronary artery bypass graft 08/16/2008    x4  . Tonsillectomy   . Pilonidal sinus tract excision   . Vasectomy     Current Outpatient Prescriptions  Medication Sig Dispense Refill  . ALPRAZolam (XANAX) 0.5 MG tablet Take 0.5 mg by mouth at bedtime as needed. HALF OF TAB .25       . aspirin (ASPIR-81) 81 MG EC tablet Take 81 mg by mouth daily.        . calcium carbonate (TUMS) 500 MG chewable tablet Chew 1 tablet by mouth daily as needed.        Marland Kitchen  Ibuprofen 200 MG CAPS Take by mouth as needed.        Marland Kitchen losartan (COZAAR) 50 MG tablet Take 50 mg by mouth daily.        . metoprolol (TOPROL-XL) 50 MG 24 hr tablet Take 50 mg by mouth daily.        . nitroGLYCERIN (NITROSTAT) 0.4 MG SL tablet Place 1 tablet (0.4 mg total) under the tongue every 5 (five) minutes as needed.  25 tablet  6  . Omega-3 Fatty Acids (FISH OIL) 1000 MG CAPS Take by mouth daily.        Marland Kitchen omeprazole (PRILOSEC) 40 MG capsule Take 40 mg by mouth daily.        . pravastatin (PRAVACHOL) 40 MG tablet Take 40 mg by mouth daily.        . traZODone (DESYREL) 50 MG tablet Take 50 mg by mouth. At bedtiem as needed sleep           Allergies  Allergen Reactions  . Lisinopril     REACTION: cough  . Zolpidem Tartrate     History   Social History  . Marital Status: Married    Spouse Name: N/A    Number of Children: 3  . Years of Education: N/A   Occupational History  . Not on file.   Social History Main Topics  . Smoking status: Former Smoker    Quit date: 07/18/2008  . Smokeless tobacco: Not on file   Comment: No smoking since bypass in 4/10  . Alcohol Use: Yes     socially  . Drug Use: No  . Sexually Active: Not on file   Other Topics Concern  . Not on file   Social History Narrative   Fulltime. ETOH sued socially.     Family History  Problem Relation Age of Onset  . Heart failure Mother   . Heart attack Father   . Heart attack Brother   . Heart attack Brother   . Heart attack Sister     Review of Systems:  As stated in the HPI and otherwise negative.   BP 122/88  Pulse 60  Ht 5\' 9"  (1.753 m)  Wt 179 lb (81.194 kg)  BMI 26.43 kg/m2  Physical Examination: General: Well developed, well nourished, NAD HEENT: OP clear, mucus membranes moist SKIN: warm, dry. No rashes. Neuro: No focal deficits Musculoskeletal: Muscle strength 5/5 all ext Psychiatric: Mood and affect normal Neck: No JVD, no carotid bruits, no thyromegaly, no lymphadenopathy. Lungs:Clear bilaterally, no wheezes, rhonci, crackles Cardiovascular: Regular rate and rhythm. No murmurs, gallops or rubs. Abdomen:Soft. Bowel sounds present. Non-tender.  Extremities: No lower extremity edema. Pulses are 2 + in the bilateral DP/PT.

## 2012-04-19 HISTORY — PX: ILEOSTOMY: SHX1783

## 2012-04-19 HISTORY — PX: COLON SURGERY: SHX602

## 2012-08-05 LAB — CBC
HCT: 44.1 % (ref 40.0–52.0)
HGB: 15.6 g/dL (ref 13.0–18.0)
MCH: 32.5 pg (ref 26.0–34.0)
MCV: 92 fL (ref 80–100)
Platelet: 146 10*3/uL — ABNORMAL LOW (ref 150–440)
RBC: 4.8 10*6/uL (ref 4.40–5.90)
RDW: 13.8 % (ref 11.5–14.5)
WBC: 12.1 10*3/uL — ABNORMAL HIGH (ref 3.8–10.6)

## 2012-08-05 LAB — COMPREHENSIVE METABOLIC PANEL
Albumin: 4.4 g/dL (ref 3.4–5.0)
Alkaline Phosphatase: 85 U/L (ref 50–136)
BUN: 17 mg/dL (ref 7–18)
Calcium, Total: 8.7 mg/dL (ref 8.5–10.1)
Chloride: 103 mmol/L (ref 98–107)
Glucose: 168 mg/dL — ABNORMAL HIGH (ref 65–99)
SGPT (ALT): 216 U/L — ABNORMAL HIGH (ref 12–78)
Sodium: 136 mmol/L (ref 136–145)
Total Protein: 7.9 g/dL (ref 6.4–8.2)

## 2012-08-05 LAB — URINALYSIS, COMPLETE
Bacteria: NONE SEEN
Bilirubin,UR: NEGATIVE
Glucose,UR: NEGATIVE mg/dL (ref 0–75)
Ketone: NEGATIVE
Leukocyte Esterase: NEGATIVE
Protein: NEGATIVE
RBC,UR: 1 /HPF (ref 0–5)
Specific Gravity: 1.021 (ref 1.003–1.030)
WBC UR: <1 /HPF (ref 0–5)

## 2012-08-06 ENCOUNTER — Inpatient Hospital Stay: Payer: Self-pay | Admitting: Surgery

## 2012-08-06 LAB — COMPREHENSIVE METABOLIC PANEL
Alkaline Phosphatase: 63 U/L (ref 50–136)
Bilirubin,Total: 0.6 mg/dL (ref 0.2–1.0)
Calcium, Total: 7.9 mg/dL — ABNORMAL LOW (ref 8.5–10.1)
EGFR (African American): 57 — ABNORMAL LOW
EGFR (Non-African Amer.): 49 — ABNORMAL LOW
Glucose: 176 mg/dL — ABNORMAL HIGH (ref 65–99)
Potassium: 4.2 mmol/L (ref 3.5–5.1)
SGOT(AST): 38 U/L — ABNORMAL HIGH (ref 15–37)
SGPT (ALT): 157 U/L — ABNORMAL HIGH (ref 12–78)
Total Protein: 6.6 g/dL (ref 6.4–8.2)

## 2012-08-06 LAB — CBC WITH DIFFERENTIAL/PLATELET
Basophil %: 0.3 %
Eosinophil #: 0 10*3/uL (ref 0.0–0.7)
Eosinophil %: 0 %
HGB: 13.8 g/dL (ref 13.0–18.0)
Lymphocyte %: 7.8 %
MCH: 31.9 pg (ref 26.0–34.0)
MCHC: 35.4 g/dL (ref 32.0–36.0)
MCV: 93 fL (ref 80–100)
Monocyte #: 1.3 x10 3/mm — ABNORMAL HIGH (ref 0.2–1.0)
Monocyte #: 0.8 x10 3/mm (ref 0.2–1.0)
Monocyte %: 7.5 %
Monocyte %: 5.3 %
Neutrophil #: 14.3 10*3/uL — ABNORMAL HIGH (ref 1.4–6.5)
Neutrophil #: 13.9 10*3/uL — ABNORMAL HIGH (ref 1.4–6.5)
Neutrophil %: 84.4 %
Platelet: 123 10*3/uL — ABNORMAL LOW (ref 150–440)
Platelet: 105 10*3/uL — ABNORMAL LOW (ref 150–440)
RDW: 13.4 % (ref 11.5–14.5)
RDW: 13.8 % (ref 11.5–14.5)
WBC: 15.9 10*3/uL — ABNORMAL HIGH (ref 3.8–10.6)

## 2012-08-06 LAB — BASIC METABOLIC PANEL
Anion Gap: 8 (ref 7–16)
BUN: 16 mg/dL (ref 7–18)
Calcium, Total: 8 mg/dL — ABNORMAL LOW (ref 8.5–10.1)
Chloride: 106 mmol/L (ref 98–107)
Creatinine: 1.29 mg/dL (ref 0.60–1.30)
EGFR (African American): >60
Potassium: 4.3 mmol/L (ref 3.5–5.1)
Sodium: 137 mmol/L (ref 136–145)

## 2012-08-06 LAB — PROTIME-INR: INR: 1.1

## 2012-08-07 LAB — COMPREHENSIVE METABOLIC PANEL
Albumin: 3.1 g/dL — ABNORMAL LOW (ref 3.4–5.0)
Alkaline Phosphatase: 59 U/L (ref 50–136)
Anion Gap: 7 (ref 7–16)
Chloride: 104 mmol/L (ref 98–107)
Co2: 24 mmol/L (ref 21–32)
Creatinine: 1.21 mg/dL (ref 0.60–1.30)
EGFR (Non-African Amer.): >60
Osmolality: 276 (ref 275–301)
Potassium: 4.6 mmol/L (ref 3.5–5.1)
SGOT(AST): 36 U/L (ref 15–37)
SGPT (ALT): 101 U/L — ABNORMAL HIGH (ref 12–78)

## 2012-08-07 LAB — CBC WITH DIFFERENTIAL/PLATELET
Basophil #: 0 10*3/uL (ref 0.0–0.1)
Basophil %: 0.1 %
Eosinophil %: 0 %
HGB: 15.1 g/dL (ref 13.0–18.0)
Lymphocyte #: 1.2 10*3/uL (ref 1.0–3.6)
Lymphocyte %: 7.9 %
MCH: 32.9 pg (ref 26.0–34.0)
MCV: 93 fL (ref 80–100)
Monocyte #: 0.8 x10 3/mm (ref 0.2–1.0)
Monocyte %: 5.4 %
Neutrophil %: 86.6 %
RBC: 4.6 10*6/uL (ref 4.40–5.90)
WBC: 15.2 10*3/uL — ABNORMAL HIGH (ref 3.8–10.6)

## 2012-08-08 LAB — BASIC METABOLIC PANEL
BUN: 10 mg/dL (ref 7–18)
Calcium, Total: 8 mg/dL — ABNORMAL LOW (ref 8.5–10.1)
EGFR (African American): >60
Glucose: 190 mg/dL — ABNORMAL HIGH (ref 65–99)
Sodium: 131 mmol/L — ABNORMAL LOW (ref 136–145)

## 2012-08-08 LAB — CBC WITH DIFFERENTIAL/PLATELET
Eosinophil #: 0 10*3/uL (ref 0.0–0.7)
Eosinophil %: 0.3 %
HCT: 38.8 % — ABNORMAL LOW (ref 40.0–52.0)
HGB: 13.6 g/dL (ref 13.0–18.0)
Lymphocyte #: 1 10*3/uL (ref 1.0–3.6)
Lymphocyte %: 8.3 %
MCH: 32.4 pg (ref 26.0–34.0)
Monocyte %: 6.6 %
Neutrophil #: 10.4 10*3/uL — ABNORMAL HIGH (ref 1.4–6.5)
Platelet: 124 10*3/uL — ABNORMAL LOW (ref 150–440)
RBC: 4.21 10*6/uL — ABNORMAL LOW (ref 4.40–5.90)
RDW: 13.9 % (ref 11.5–14.5)

## 2012-08-08 LAB — PATHOLOGY REPORT

## 2012-08-09 LAB — CBC WITH DIFFERENTIAL/PLATELET
Eosinophil %: 0.4 %
HCT: 38.5 % — ABNORMAL LOW (ref 40.0–52.0)
HGB: 13.7 g/dL (ref 13.0–18.0)
Lymphocyte %: 9.5 %
MCHC: 35.6 g/dL (ref 32.0–36.0)
MCV: 91 fL (ref 80–100)
Monocyte %: 6.6 %
Neutrophil #: 9.8 10*3/uL — ABNORMAL HIGH (ref 1.4–6.5)
RBC: 4.23 10*6/uL — ABNORMAL LOW (ref 4.40–5.90)
RDW: 13.7 % (ref 11.5–14.5)
WBC: 11.8 10*3/uL — ABNORMAL HIGH (ref 3.8–10.6)

## 2012-08-09 LAB — BASIC METABOLIC PANEL
Anion Gap: 4 — ABNORMAL LOW (ref 7–16)
BUN: 14 mg/dL (ref 7–18)
Calcium, Total: 8.6 mg/dL (ref 8.5–10.1)
Chloride: 102 mmol/L (ref 98–107)
Co2: 28 mmol/L (ref 21–32)
Creatinine: 1 mg/dL (ref 0.60–1.30)
EGFR (African American): >60
EGFR (Non-African Amer.): >60
Glucose: 165 mg/dL — ABNORMAL HIGH (ref 65–99)
Osmolality: 272 (ref 275–301)

## 2012-08-14 ENCOUNTER — Telehealth: Payer: Self-pay | Admitting: Cardiovascular Disease

## 2012-08-14 NOTE — Telephone Encounter (Signed)
New Prob    Wanted to notify Dr. Clifton James that pt recently (past weekend) had sigmoid colectomy surgery. Pt is feeling weak and wife is concerned and would like to speak to nurse about this.

## 2012-08-14 NOTE — Telephone Encounter (Signed)
Wife called because pt had an emergency Sigmoid Colectomy for a Perforated Diverticulitis on Easter Sunday. Pt was D/C home yesterday. Pt is scheduled for a reversal Colonoscopy in 6 weeks. Wife would like to know if Dr. Clifton James would want to see pt before he has this procedure. Wife states while pt was in the (regional) hospital, a cardiologist was not consulted. Wife  states that MD can call pt if he would like to do that.

## 2012-08-15 ENCOUNTER — Emergency Department: Payer: Self-pay | Admitting: Emergency Medicine

## 2012-08-15 NOTE — Telephone Encounter (Signed)
Joseph Bible, Can we check with Joseph [name] tomorrow and see how he is doing? I will be glad to see him if he is having any cardiac issues. Thayer Ohm

## 2012-08-16 NOTE — Telephone Encounter (Signed)
Spoke with pt's wife. She will have records of pt's recent hospitalization sent to [place] from [facility]. Pt is doing well from a cardiac standpoint. He is due for surgery in 6 weeks as outlined below. Surgeon has not requested cardiac clearance. Wife does not feel pt needs to see Dr. Clifton James prior to surgery but just wanted to make [place] aware. I told her I would contact her if we received request for surgical clearance. I told her Dr. Clifton James would be happy to see pt if he wanted to be seen prior to surgery.

## 2012-08-22 NOTE — Telephone Encounter (Signed)
Spoke with pt's wife. She would like to schedule appt for pt to see Dr. Clifton James prior to planned surgery. Appt made for Aug 31, 2012 at 11:15. Pt's wife picked up release form today and has completed. She will mail this to office.

## 2012-08-24 ENCOUNTER — Telehealth: Payer: Self-pay | Admitting: Cardiovascular Disease

## 2012-08-24 NOTE — Telephone Encounter (Signed)
ROI faxed to [facility] 08/24/12/KM

## 2012-08-24 NOTE — Telephone Encounter (Signed)
Hold Cozaar until we see him. Agree otherwise. cdm

## 2012-08-24 NOTE — Telephone Encounter (Signed)
Spoke with pt and gave him instructions from Dr. McAlhany.   

## 2012-08-24 NOTE — Telephone Encounter (Signed)
Spoke with pt who is asking if he should keep appt on Aug 31, 2012 scheduled with Dr. Clifton James. I told pt that we had not received request for surgical clearance but that wife felt he should be seen and since he is due for appt in August with Dr. Clifton James  I also felt it would be a good idea for him to keep this office visit. Pt agreeable with this plan. He then states he felt dizzy this AM upon standing. He checked blood pressure and it was 89/?, heart rate was 132.  He has checked 3 times since then. Readings have been 94 /73, heart rate 123: 96/78, heart rate 113 and last check was about an hour ago with blood pressure of 86/66 and heart rate 102.  Dizziness has resolved. He reports he is staying hydrated. I advised him to change positions slowly and make sure he drank plenty of fluids.  He reports he does not regularly check blood pressure but when he does it is usually 110/80 with heart rate around 80.  He states dizziness happened before he took AM meds. I have instructed pt to continue to monitor heart rate and blood pressure and let us know if it remains low or dizziness returns.  He has also called his primary MD for advice regarding this.

## 2012-08-24 NOTE — Telephone Encounter (Signed)
New Prob     Pt is had supposed to have an illiostomy reversal surgery in late June. Pt wants to know if it is necessary to see Dr. Clifton James before procedure (cardiac clearance was not asked for by surgeon). Pease call.

## 2012-08-30 ENCOUNTER — Telehealth: Payer: Self-pay | Admitting: Cardiovascular Disease

## 2012-08-30 NOTE — Telephone Encounter (Signed)
Records rec From [facility], gave to [facility]  08/30/12/KM

## 2012-08-31 ENCOUNTER — Encounter: Payer: Self-pay | Admitting: Cardiovascular Disease

## 2012-08-31 ENCOUNTER — Ambulatory Visit (INDEPENDENT_AMBULATORY_CARE_PROVIDER_SITE_OTHER): Payer: BC Managed Care – PPO | Admitting: Cardiovascular Disease

## 2012-08-31 VITALS — BP 134/82 | HR 77 | Ht 69.0 in | Wt 164.0 lb

## 2012-08-31 DIAGNOSIS — I2510 Atherosclerotic heart disease of native coronary artery without angina pectoris: Secondary | ICD-10-CM

## 2012-08-31 DIAGNOSIS — I951 Orthostatic hypotension: Secondary | ICD-10-CM

## 2012-08-31 MED ORDER — NITROGLYCERIN 0.4 MG SL SUBL: 0.4000 mg | SUBLINGUAL_TABLET | SUBLINGUAL | Status: DC | PRN

## 2012-08-31 NOTE — Progress Notes (Signed)
History of Present Illness: 57 yo WM with history of CAD s/p 4V CABG, borderline DM, GERD and nephrolithiasis admitted to [facility] 08/15/08 with an acute anterior STEMI. He presented to the [facility] ED with c/o of chest pain and was found to have anterior ST segment elevation.Emergent cath demonstrated a patent prior stent in the ostial LAD extending into the mid LAD. There was a hazy 99% stenosis in the mid LAD that was felt to be the culprit. PCI failed after multiple attempts and the pt was taken emergently to the operating room for bypass. Emergent CABG was performed by Dr. Dorris Fetch with 4 vessels bypassed (LIMA to LAD, SVG to D2, SVG to OM2, SVG to distal RCA). After surgery, he had problems with chest wall pain and went to [facility] for evaluation by CT surgery. He underwent sternotomy with removal of sternal wires and revision by Dr. Ty Hilts on 03/28/09. He has done well since then. Pre-operatively at [facility], he had a stress echo which showed segmental LV dysfunction with baseline EF of 40%, no stress induced ichemia. CTA showed 2/4 patent grafts (patent LIMA to LAD and patent SVG to OM. The SVG to RCA was occluded and the SVG to the Diagonal was occluded). He was admitted to [facility] 08/09/09 to 08/11/09 with chest pain. Cardiac cath repeated and showed patent LIMA to LAD, patent SVG to OM with occluded SVG to Diagonal and SVG to RCA. EF was 45%. Exercise treadmill stress test August 2012 with no EKG changes with exercise. He was admitted to [facility] April 2014 with abdominal pain and found to have bowel perforation with partial colectomy. He now has an ileostomy.   He is here today for follow up. He has been doing well but recent hypotension. Losartan was held and BP is slightly better. He was instructed to increase po intake and he is feeling less dizzuy.  No exertional chest pressure.   Primary Care Physician: Dr. Dorothey Baseman at [facility] [place]  Last Lipid  Profile: Followed in primary care.    Past Medical History  Diagnosis Date  . CAD (coronary artery disease)     s/p emergent 4 V CABG (4/10) w 2/4 patent grafts by cath 08/11/09, patent LIMA to LAD & SVG to OM w moderate disease native RCA & occluded grafts to RCA and diagonal  . 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 Psychosexual dysfunction with inhibited sexual excitement   . GERD (gastroesophageal reflux disease)   . Nephrolithiasis   . Normal echocardiogram 5/10    EF 60, apical & apicallateral HK, PASP . Repeat at [facility] w reported ef of 40%.   . Chest pain     secondary to sternal non-union post CABg-now s/p sternotomy w repair 03/28/09 at [facility], dr. Ty Hilts  . HTN (hypertension)   . 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 Coronary artery bypass graft  08/16/2008    x4  . Tonsillectomy    . Pilonidal sinus tract excision    . Vasectomy      Current Outpatient Prescriptions  Medication Sig Dispense Refill  . ALPRAZolam (XANAX) 0.5 MG tablet Take 0.5 mg by mouth at bedtime as needed. HALF OF TAB .25       . aspirin (ASPIR-81) 81 MG EC tablet Take 81 mg by mouth daily.        . B Complex Vitamins (B-COMPLEX/B-12) TABS Take by  mouth 1 day or 1 dose.      . calcium carbonate (TUMS) 500 MG chewable tablet Chew 1 tablet by mouth daily as needed.        . diphenoxylate-atropine (LOMOTIL) 2.5-0.025 MG per tablet Take 1 tablet by mouth 1 day or 1 dose.      . metoprolol (TOPROL-XL) 50 MG 24 hr tablet Take 50 mg by mouth daily.        . nitroGLYCERIN (NITROSTAT) 0.4 MG SL tablet Place 1 tablet (0.4 mg total) under the tongue every 5 (five) minutes as needed.  25 tablet  6  . Omega-3 Fatty Acids (FISH OIL) 1000 MG CAPS Take by mouth daily.        Marland Kitchen omeprazole (PRILOSEC) 40 MG capsule Take 40 mg by mouth daily.        Marland Kitchen oxyCODONE-acetaminophen  (PERCOCET/ROXICET) 5-325 MG per tablet Take 1 tablet by mouth every 4 (four) hours as needed for pain.      . pravastatin (PRAVACHOL) 40 MG tablet Take 40 mg by mouth daily.        . traZODone (DESYREL) 50 MG tablet Take 50 mg by mouth. At bedtiem as needed sleep        No current facility-administered medications for this visit.    Allergies  Allergen Reactions  . Keflin (Cephalothin)     rash  . Lamisil (Terbinafine Hcl)     Rash/itchy  . Lisinopril     REACTION: cough  . Zolpidem Tartrate     History   Social History  . Marital Status: Married    Spouse Name: N/A    Number of Children: 3  . Years of Education: N/A   Occupational History  . Not on file.   Social History Main Topics  . Smoking status: Former Smoker    Quit date: 07/18/2008  . Smokeless tobacco: Not on file     Comment: No smoking since bypass in 4/10  . Alcohol Use: Yes     Comment: socially  . Drug Use: No  . Sexually Active: Not on file   Other Topics Concern  . Not on file   Social History Narrative   Fulltime. ETOH sued socially.     Family History  Problem Relation Age of Onset  . Heart failure Mother   . Heart attack Father   . Heart attack Brother   . Heart attack Brother   . Heart attack Sister     Review of Systems:  As stated in the HPI and otherwise negative.   BP 134/82  Pulse 77  Ht 5\' 9"  (1.753 m)  Wt 164 lb (74.39 kg)  BMI 24.21 kg/m2  SpO2 98%  Physical Examination: General: Well developed, well nourished, NAD HEENT: OP clear, mucus membranes moist SKIN: warm, dry. No rashes. Neuro: No focal deficits Musculoskeletal: Muscle strength 5/5 all ext Psychiatric: Mood and affect normal Neck: No JVD, no carotid bruits, no thyromegaly, no lymphadenopathy. Lungs:Clear bilaterally, no wheezes, rhonci, crackles Cardiovascular: Regular rate and rhythm. No murmurs, gallops or rubs. Abdomen:Soft. Bowel sounds present. Non-tender.  Extremities: No lower extremity edema.  Pulses are 2 + in the bilateral DP/PT.  EKG: NSR, rate 73 bpm.   Assessment and Plan:   1. CAD: Stable. No symptoms of unstable angina. Will continue current therapy. Will hold ARB with recent hypotension. OK for planned surgical procedure without further ischemic testing.   2. Hypotension: Likely related to his ostomy with fluid shifts. D/C Losartan. Continue beta  blocker. Continue aggressive po intake.

## 2012-08-31 NOTE — Patient Instructions (Addendum)
Your physician recommends that you schedule a follow-up appointment for end of July--November 15, 2012 at 9:45

## 2012-09-01 ENCOUNTER — Telehealth: Payer: Self-pay | Admitting: Cardiovascular Disease

## 2012-09-01 NOTE — Telephone Encounter (Signed)
New problem   Pt woke up with a night sweat that he usually doesn't have. Pt is concerned with this. Please call pt.

## 2012-09-01 NOTE — Telephone Encounter (Signed)
Spoke with pt. He reports he woke up at 2:50 with episode of sweating. He did not check temp. He drank water and returned to bed.  Has not happened again. No chest pain, shortness of breath or other complaints.  He will monitor temp and call primary MD if elevated.

## 2012-09-14 ENCOUNTER — Other Ambulatory Visit: Payer: Self-pay | Admitting: Surgery

## 2012-09-14 LAB — COMPREHENSIVE METABOLIC PANEL
Alkaline Phosphatase: 93 U/L (ref 50–136)
Anion Gap: 5 — ABNORMAL LOW (ref 7–16)
BUN: 14 mg/dL (ref 7–18)
Calcium, Total: 9.1 mg/dL (ref 8.5–10.1)
Chloride: 105 mmol/L (ref 98–107)
Co2: 27 mmol/L (ref 21–32)
Creatinine: 1.03 mg/dL (ref 0.60–1.30)
EGFR (African American): >60
EGFR (Non-African Amer.): >60
Glucose: 116 mg/dL — ABNORMAL HIGH (ref 65–99)
Osmolality: 275 (ref 275–301)
Potassium: 4 mmol/L (ref 3.5–5.1)
SGOT(AST): 61 U/L — ABNORMAL HIGH (ref 15–37)
SGPT (ALT): 151 U/L — ABNORMAL HIGH (ref 12–78)
Sodium: 137 mmol/L (ref 136–145)
Total Protein: 7.6 g/dL (ref 6.4–8.2)

## 2012-09-14 LAB — CBC WITH DIFFERENTIAL/PLATELET
Basophil %: 1.2 %
Eosinophil #: 0.4 10*3/uL (ref 0.0–0.7)
Eosinophil %: 5.9 %
HCT: 39.4 % — ABNORMAL LOW (ref 40.0–52.0)
HGB: 14.1 g/dL (ref 13.0–18.0)
Lymphocyte #: 1.6 10*3/uL (ref 1.0–3.6)
Lymphocyte %: 26.8 %
MCH: 32.4 pg (ref 26.0–34.0)
MCHC: 35.8 g/dL (ref 32.0–36.0)
Monocyte #: 0.4 x10 3/mm (ref 0.2–1.0)
Monocyte %: 7.1 %
Neutrophil #: 3.6 10*3/uL (ref 1.4–6.5)
Neutrophil %: 59 %
Platelet: 180 10*3/uL (ref 150–440)
RBC: 4.36 10*6/uL — ABNORMAL LOW (ref 4.40–5.90)
WBC: 6 10*3/uL (ref 3.8–10.6)

## 2012-09-14 LAB — LIPID PANEL
Cholesterol: 92 mg/dL (ref 0–200)
HDL Cholesterol: 22 mg/dL — ABNORMAL LOW (ref 40–60)
VLDL Cholesterol, Calc: 46 mg/dL — ABNORMAL HIGH (ref 5–40)

## 2012-09-18 ENCOUNTER — Ambulatory Visit: Payer: Self-pay | Admitting: Surgery

## 2012-09-27 ENCOUNTER — Telehealth: Payer: Self-pay | Admitting: Cardiovascular Disease

## 2012-09-27 NOTE — Telephone Encounter (Signed)
Pt Called in this am Requesting Copies of His LOV & all EKG's I have Copied these and have ROI on hand For Hime To sign When he picks Up  09/27/12/KM

## 2012-09-27 NOTE — Telephone Encounter (Signed)
Pt signed ROI, Picked Up Records  09/27/12/KM

## 2012-10-04 ENCOUNTER — Ambulatory Visit: Payer: Self-pay | Admitting: Surgery

## 2012-10-04 LAB — CBC WITH DIFFERENTIAL/PLATELET
Basophil #: 0.1 10*3/uL (ref 0.0–0.1)
Eosinophil %: 5.8 %
HCT: 40.2 % (ref 40.0–52.0)
HGB: 14.4 g/dL (ref 13.0–18.0)
Lymphocyte #: 1.8 10*3/uL (ref 1.0–3.6)
Lymphocyte %: 25.9 %
MCH: 32.2 pg (ref 26.0–34.0)
MCHC: 35.7 g/dL (ref 32.0–36.0)
Neutrophil #: 4.2 10*3/uL (ref 1.4–6.5)
RDW: 13.1 % (ref 11.5–14.5)

## 2012-10-04 LAB — BASIC METABOLIC PANEL
Anion Gap: 5 — ABNORMAL LOW (ref 7–16)
BUN: 19 mg/dL — ABNORMAL HIGH (ref 7–18)
Calcium, Total: 9.2 mg/dL (ref 8.5–10.1)
Co2: 24 mmol/L (ref 21–32)
EGFR (African American): >60
Potassium: 3.9 mmol/L (ref 3.5–5.1)
Sodium: 138 mmol/L (ref 136–145)

## 2012-10-04 LAB — HEPATIC FUNCTION PANEL A (ARMC)
Bilirubin, Direct: 0.1 mg/dL (ref 0.00–0.20)
Bilirubin,Total: 0.4 mg/dL (ref 0.2–1.0)
SGPT (ALT): 159 U/L — ABNORMAL HIGH (ref 12–78)
Total Protein: 7.8 g/dL (ref 6.4–8.2)

## 2012-10-11 ENCOUNTER — Inpatient Hospital Stay: Payer: Self-pay | Admitting: Surgery

## 2012-10-12 LAB — BASIC METABOLIC PANEL
Anion Gap: 9 (ref 7–16)
BUN: 14 mg/dL (ref 7–18)
Creatinine: 1.2 mg/dL (ref 0.60–1.30)
EGFR (African American): >60
Glucose: 166 mg/dL — ABNORMAL HIGH (ref 65–99)
Osmolality: 280 (ref 275–301)
Potassium: 4.6 mmol/L (ref 3.5–5.1)

## 2012-10-12 LAB — CBC WITH DIFFERENTIAL/PLATELET
Basophil #: 0 10*3/uL (ref 0.0–0.1)
Basophil %: 0.1 %
Eosinophil #: 0 10*3/uL (ref 0.0–0.7)
Eosinophil %: 0 %
HCT: 35.2 % — ABNORMAL LOW (ref 40.0–52.0)
MCHC: 36 g/dL (ref 32.0–36.0)
Monocyte #: 0.5 x10 3/mm (ref 0.2–1.0)
Neutrophil #: 10.5 10*3/uL — ABNORMAL HIGH (ref 1.4–6.5)
Platelet: 126 10*3/uL — ABNORMAL LOW (ref 150–440)
RBC: 3.97 10*6/uL — ABNORMAL LOW (ref 4.40–5.90)
RDW: 13.2 % (ref 11.5–14.5)
WBC: 11.8 10*3/uL — ABNORMAL HIGH (ref 3.8–10.6)

## 2012-10-12 LAB — PATHOLOGY REPORT

## 2012-10-13 LAB — CBC WITH DIFFERENTIAL/PLATELET
Basophil #: 0 10*3/uL (ref 0.0–0.1)
HCT: 37 % — ABNORMAL LOW (ref 40.0–52.0)
HGB: 13.2 g/dL (ref 13.0–18.0)
Lymphocyte #: 1.7 10*3/uL (ref 1.0–3.6)
Lymphocyte %: 19.7 %
MCHC: 35.6 g/dL (ref 32.0–36.0)
MCV: 91 fL (ref 80–100)
Neutrophil #: 6.1 10*3/uL (ref 1.4–6.5)
Neutrophil %: 70.7 %
RDW: 13.1 % (ref 11.5–14.5)
WBC: 8.6 10*3/uL (ref 3.8–10.6)

## 2012-10-14 LAB — CBC WITH DIFFERENTIAL/PLATELET
Basophil #: 0 10*3/uL (ref 0.0–0.1)
Basophil %: 0.7 %
Eosinophil #: 0.2 10*3/uL (ref 0.0–0.7)
Eosinophil %: 2.5 %
HGB: 12.2 g/dL — ABNORMAL LOW (ref 13.0–18.0)
MCV: 89 fL (ref 80–100)
Monocyte %: 9.2 %
Neutrophil #: 5.4 10*3/uL (ref 1.4–6.5)
Neutrophil %: 71.4 %
Platelet: 118 10*3/uL — ABNORMAL LOW (ref 150–440)
RBC: 3.81 10*6/uL — ABNORMAL LOW (ref 4.40–5.90)
RDW: 13.1 % (ref 11.5–14.5)

## 2012-11-03 DIAGNOSIS — Z0271 Encounter for disability determination: Secondary | ICD-10-CM

## 2012-11-15 ENCOUNTER — Ambulatory Visit (INDEPENDENT_AMBULATORY_CARE_PROVIDER_SITE_OTHER): Payer: BC Managed Care – PPO | Admitting: Cardiovascular Disease

## 2012-11-15 ENCOUNTER — Telehealth: Payer: Self-pay | Admitting: Cardiovascular Disease

## 2012-11-15 ENCOUNTER — Encounter: Payer: Self-pay | Admitting: Cardiovascular Disease

## 2012-11-15 VITALS — BP 130/80 | HR 72 | Ht 69.0 in | Wt 166.0 lb

## 2012-11-15 DIAGNOSIS — I2510 Atherosclerotic heart disease of native coronary artery without angina pectoris: Secondary | ICD-10-CM

## 2012-11-15 MED ORDER — LOSARTAN POTASSIUM 50 MG PO TABS: 50.0000 mg | ORAL_TABLET | Freq: Every day | ORAL | Status: DC

## 2012-11-15 NOTE — Telephone Encounter (Signed)
Pt signed ROI in Office, LOV (11/15/12) Mailed to pt Home address  11/15/12/KM

## 2012-11-15 NOTE — Patient Instructions (Addendum)
Your physician wants you to follow-up in:  6 months. You will receive a reminder letter in the mail two months in advance. If you don't receive a letter, please call our office to schedule the follow-up appointment.  Your physician has recommended you make the following change in your medication:  Start Losartan 50 mg by mouth daily

## 2012-11-15 NOTE — Progress Notes (Addendum)
History of Present Illness: 57 yo WM with history of CAD s/p 4V CABG, borderline DM, GERD and nephrolithiasis admitted to [facility] 08/15/08 with an acute anterior STEMI. He presented to the [facility] ED with c/o of chest pain and was found to have anterior ST segment elevation.Emergent cath demonstrated a patent prior stent in the ostial LAD extending into the mid LAD. There was a hazy 99% stenosis in the mid LAD that was felt to be the culprit. PCI failed after multiple attempts and the pt was taken emergently to the operating room for bypass. Emergent CABG was performed by Dr. Dorris Fetch with 4 vessels bypassed (LIMA to LAD, SVG to D2, SVG to OM2, SVG to distal RCA). After surgery, he had problems with chest wall pain and went to [facility] for evaluation by CT surgery. He underwent sternotomy with removal of sternal wires and revision by Dr. Ty Hilts on 03/28/09. He has done well since then. Pre-operatively at [facility], he had a stress echo which showed segmental LV dysfunction with baseline EF of 40%, no stress induced ichemia. CTA showed 2/4 patent grafts (patent LIMA to LAD and patent SVG to OM. The SVG to RCA was occluded and the SVG to the Diagonal was occluded). He was admitted to [facility] 08/09/09 to 08/11/09 with chest pain. Cardiac cath repeated and showed patent LIMA to LAD, patent SVG to OM with occluded SVG to Diagonal and SVG to RCA. EF was 45%. Exercise treadmill stress test August 2012 with no EKG changes with exercise. He was admitted to [facility] April 2014 with abdominal pain and found to have bowel perforation with partial colectomy and had a reversal 10/11/12.   He is here today for follow up. He has been doing well but continues to have fatigue.  Losartan was held for hypotension in May 2014. No chest pain or SOB.   Primary Care Physician: Dr. Dorothey Baseman at [facility]   Last Lipid Profile: Followed in primary care.   Past Medical History  Diagnosis  Date  . CAD (coronary artery disease)     s/p emergent 4 V CABG (4/10) w 2/4 patent grafts by cath 08/11/09, patent LIMA to LAD & SVG to OM w moderate disease native RCA & occluded grafts to RCA and diagonal  . 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 Psychosexual dysfunction with inhibited sexual excitement   . GERD (gastroesophageal reflux disease)   . Nephrolithiasis   . Normal echocardiogram 5/10    EF 60, apical & apicallateral HK, PASP . Repeat at [facility] w reported ef of 40%.   . Chest pain     secondary to sternal non-union post CABg-now s/p sternotomy w repair 03/28/09 at [facility], dr. Ty Hilts  . HTN (hypertension)   . 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 Coronary artery bypass graft  08/16/2008    x4  . Tonsillectomy    . Pilonidal sinus tract excision    . Vasectomy      Current Outpatient Prescriptions  Medication Sig Dispense Refill  . ALPRAZolam (XANAX) 0.5 MG tablet Take 0.5 mg by mouth at bedtime as needed. HALF OF TAB .25       . aspirin (ASPIR-81) 81 MG EC tablet Take 81 mg by mouth daily.        . B Complex Vitamins (B-COMPLEX/B-12) TABS Take by mouth 1 day or 1 dose.      Marland Kitchen  calcium carbonate (TUMS) 500 MG chewable tablet Chew 1 tablet by mouth daily as needed.        Marland Kitchen HYDROcodone-acetaminophen (NORCO/VICODIN) 5-325 MG per tablet Take 1 tablet by mouth every 6 (six) hours as needed for pain.      Marland Kitchen ibuprofen (ADVIL,MOTRIN) 200 MG tablet Take 200 mg by mouth every 6 (six) hours as needed for pain.      . metoprolol (TOPROL-XL) 50 MG 24 hr tablet Take 50 mg by mouth daily.        . nitroGLYCERIN (NITROSTAT) 0.4 MG SL tablet Place 1 tablet (0.4 mg total) under the tongue every 5 (five) minutes as needed.  25 tablet  6  . Omega-3 Fatty Acids (FISH OIL) 1000 MG CAPS Take 2,000 mg by mouth daily.       Marland Kitchen omeprazole (PRILOSEC) 40 MG capsule Take 40  mg by mouth daily.        . pravastatin (PRAVACHOL) 40 MG tablet Take 40 mg by mouth daily.        . traZODone (DESYREL) 50 MG tablet Take 50 mg by mouth. At bedtiem as needed sleep        No current facility-administered medications for this visit.    Allergies  Allergen Reactions  . Keflin (Cephalothin)     rash  . Lamisil (Terbinafine Hcl)     Rash/itchy  . Lisinopril     REACTION: cough  . Zolpidem Tartrate     History   Social History  . Marital Status: Married    Spouse Name: N/A    Number of Children: 3  . Years of Education: N/A   Occupational History  . Not on file.   Social History Main Topics  . Smoking status: Former Smoker    Quit date: 07/18/2008  . Smokeless tobacco: Not on file     Comment: No smoking since bypass in 4/10  . Alcohol Use: Yes     Comment: rarely  . Drug Use: No  . Sexually Active: Not on file   Other Topics Concern  . Not on file   Social History Narrative       Family History  Problem Relation Age of Onset  . Heart failure Mother   . Heart attack Father   . Heart attack Brother   . Heart attack Brother   . Heart attack Sister     Review of Systems:  As stated in the HPI and otherwise negative.   BP 130/80  Pulse 72  Ht 5\' 9"  (1.753 m)  Wt 166 lb (75.297 kg)  BMI 24.5 kg/m2  Physical Examination: General: Well developed, well nourished, NAD HEENT: OP clear, mucus membranes moist SKIN: warm, dry. No rashes. Neuro: No focal deficits Musculoskeletal: Muscle strength 5/5 all ext Psychiatric: Mood and affect normal Neck: No JVD, no carotid bruits, no thyromegaly, no lymphadenopathy. Lungs:Clear bilaterally, no wheezes, rhonci, crackles Cardiovascular: Regular rate and rhythm. No murmurs, gallops or rubs. Abdomen:Soft. Bowel sounds present. Non-tender.  Extremities: No lower extremity edema. Pulses are 2 + in the bilateral DP/PT.  Assessment and Plan:   1. CAD: Stable. No symptoms of unstable angina. Will continue  current therapy. Will restart Losartan 50 mg po Qdaily and he will follow bp at home.

## 2012-11-22 ENCOUNTER — Ambulatory Visit: Payer: BC Managed Care – PPO | Admitting: Cardiovascular Disease

## 2012-11-27 ENCOUNTER — Telehealth: Payer: Self-pay | Admitting: Cardiovascular Disease

## 2012-11-27 NOTE — Telephone Encounter (Signed)
This is regarding a change pt needs made to his medical records. Message has been forwarded to [facility] in medical records to address. I spoke with pt and told him I had sent request to [facility] and she would be contacting him.

## 2012-11-27 NOTE — Telephone Encounter (Signed)
New Problem  Pt wants his office visit records // He did receive one, but he claims that it was inaccurate and wants to clear it for an upcomming disability claim.

## 2012-12-12 NOTE — Telephone Encounter (Signed)
New Problem  Pt states the last OV was 6/30 and they requested the medical records that they have received// when they received the written copy for the OV// in the social history it had// ETOH [place]annie [place]nee and Social drinking..  Pt requests that you change sue to use and social drinking to seldom drinking or not at all.// to submit disability forms/// Pt is upset because she has not heard from [place] to have medical records amended.

## 2012-12-21 NOTE — Telephone Encounter (Signed)
New Problem  Pt ask a call back to discuss lab report

## 2012-12-21 NOTE — Telephone Encounter (Signed)
Should be ok to increase iron and use testosterone. cdm

## 2012-12-21 NOTE — Telephone Encounter (Signed)
Spoke with pt. He reports he has paperwork for changing medical record and has completed and returned. He also reports he had lab work done at primary care office recently and iron saturation was low and it was recommended that he increase iron tablets from 27 mg  daily to 325 mg daily. Pt would like to know if OK with Dr. Clifton James to make this change. Also recent lab work showed he had low testosterone levels and primary MD may start him on testosterone gel or injection for this. He is asking if OK to take these. He will also fax these labs to our office.

## 2012-12-21 NOTE — Telephone Encounter (Signed)
Spoke with pt and gave him information from Dr. McAlhany 

## 2013-01-24 ENCOUNTER — Ambulatory Visit: Payer: Self-pay | Admitting: Podiatry

## 2013-02-14 DIAGNOSIS — Z0271 Encounter for disability determination: Secondary | ICD-10-CM

## 2013-02-15 ENCOUNTER — Telehealth: Payer: Self-pay | Admitting: Cardiovascular Disease

## 2013-02-15 NOTE — Telephone Encounter (Signed)
Received Amendment Request December 07, 2012 Signed Amendment forms received Sept 4, 2014 then send to [facility] to be scan Sept 10, 2014 Note* The Amendment forms were not received at [employer].  Amendment forms and letter send to Doylene Bode Feb 15, 2013 [facility]

## 2013-02-28 ENCOUNTER — Encounter: Payer: Self-pay | Admitting: Cardiovascular Disease

## 2013-05-01 ENCOUNTER — Encounter (INDEPENDENT_AMBULATORY_CARE_PROVIDER_SITE_OTHER): Payer: Self-pay

## 2013-05-01 ENCOUNTER — Encounter: Payer: Self-pay | Admitting: Cardiovascular Disease

## 2013-05-01 ENCOUNTER — Ambulatory Visit (INDEPENDENT_AMBULATORY_CARE_PROVIDER_SITE_OTHER): Payer: BC Managed Care – PPO | Admitting: Cardiovascular Disease

## 2013-05-01 VITALS — BP 126/88 | HR 65 | Ht 70.0 in | Wt 175.0 lb

## 2013-05-01 DIAGNOSIS — I2510 Atherosclerotic heart disease of native coronary artery without angina pectoris: Secondary | ICD-10-CM

## 2013-05-01 NOTE — Progress Notes (Signed)
History of Present Illness: 58 yo WM with history of CAD s/p 4V CABG, borderline DM, GERD and nephrolithiasis admitted to [facility] 08/15/08 with an acute anterior STEMI. He presented to the [facility] ED with c/o of chest pain and was found to have anterior ST segment elevation.Emergent cath demonstrated a patent prior stent in the ostial LAD extending into the mid LAD. There was a hazy 99% stenosis in the mid LAD that was felt to be the culprit. PCI failed after multiple attempts, followed by ventricular fibrillation and the pt was taken emergently to the operating room for bypass. Emergent CABG was performed by Dr. Roxan Hockey with 4 vessels bypassed (LIMA to LAD, SVG to D2, SVG to OM2, SVG to distal RCA). After surgery, he had problems with chest wall pain and went to [facility] for evaluation by CT surgery. He underwent sternotomy with removal of sternal wires and revision by Dr. Lunette Stands on 03/28/09. He has done well since then. Pre-operatively at [facility], he had a stress echo which showed segmental LV dysfunction with baseline EF of 40%, no stress induced ichemia. CTA showed 2/4 patent grafts (patent LIMA to LAD and patent SVG to OM. The SVG to RCA was occluded and the SVG to the Diagonal was occluded). He was admitted to [facility] 08/09/09 to 08/11/09 with chest pain. Cardiac cath repeated and showed patent LIMA to LAD, patent SVG to OM with occluded SVG to Diagonal and SVG to RCA. EF was 45%. Exercise treadmill stress test August 2012 with no EKG changes with exercise. He was admitted to [facility] April 2014 with abdominal pain and found to have bowel perforation with partial colectomy and had a reversal 10/11/12.   He is here today for follow up. He has been doing well. No chest pain or SOB. Energy level is good.    Primary Care Physician: Dr. Juluis Pitch at [place]: Followed in primary care.   Past Medical History  Diagnosis Date  . CAD  (coronary artery disease)     s/p emergent 4 V CABG (4/10) w 2/4 patent grafts by cath 08/11/09, patent LIMA to LAD & SVG to OM w moderate disease native RCA & occluded grafts to RCA and diagonal  . 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 GERD (gastroesophageal reflux disease)   . Nephrolithiasis   . Normal echocardiogram 5/10    EF 60, apical & apicallateral HK, PASP 62mmHg. Repeat at [facility] w reported ef of 40%.   . Chest pain     secondary to sternal non-union post CABg-now s/p sternotomy w repair 03/28/09 at [facility], dr. Lunette Stands  . HTN (hypertension)   . 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 Coronary artery bypass graft  08/16/2008    x4  . Tonsillectomy    . Pilonidal sinus tract excision    . Vasectomy      Current Outpatient Prescriptions  Medication Sig Dispense Refill  . ALPRAZolam (XANAX) 0.5 MG tablet Take 0.5 mg by mouth at bedtime as needed. HALF OF TAB .25       . aspirin (ASPIR-81) 81 MG EC tablet Take 81 mg by mouth daily.        Marland Kitchen ibuprofen (ADVIL,MOTRIN) 200 MG tablet Take 200 mg by mouth every 6 (six) hours as needed for pain.      Marland Kitchen losartan (COZAAR) 50 MG tablet Take  1 tablet (50 mg total) by mouth daily.  90 tablet  3  . metoprolol (TOPROL-XL) 50 MG 24 hr tablet Take 50 mg by mouth daily.        . Omega-3 Fatty Acids (FISH OIL) 1000 MG CAPS Take 2,000 mg by mouth daily.       Marland Kitchen omeprazole (PRILOSEC) 40 MG capsule Take 40 mg by mouth daily.        . pravastatin (PRAVACHOL) 40 MG tablet Take 40 mg by mouth daily.        . traZODone (DESYREL) 50 MG tablet Take 50 mg by mouth. At bedtiem as needed sleep       . nitroGLYCERIN (NITROSTAT) 0.4 MG SL tablet Place 1 tablet (0.4 mg total) under the tongue every 5 (five) minutes as needed.  25 tablet  6   No current facility-administered medications for this visit.    Allergies  Allergen Reactions    . Keflin [Cephalothin]     rash  . Lamisil [Terbinafine Hcl]     Rash/itchy  . Lisinopril     REACTION: cough  . Zolpidem Tartrate     History   Social History  . Marital Status: Married    Spouse Name: N/A    Number of Children: 3  . Years of Education: N/A   Occupational History  . Not on file.   Social History Main Topics  . Smoking status: Former Smoker    Quit date: 07/18/2008  . Smokeless tobacco: Not on file     Comment: No smoking since bypass in 4/10  . Alcohol Use: No       . Drug Use: No  . Sexually Active: Not on file   Other Topics Concern  . Not on file   Social History Narrative       Family History  Problem Relation Age of Onset  . Heart failure Mother   . Heart attack Father   . Heart attack Brother   . Heart attack Brother   . Heart attack Sister     Review of Systems:  As stated in the HPI and otherwise negative.   BP 126/88  Pulse 65  Ht 5\' 10"  (1.778 m)  Wt 175 lb (79.379 kg)  BMI 25.11 kg/m2  SpO2 96%  Physical Examination: General: Well developed, well nourished, NAD HEENT: OP clear, mucus membranes moist SKIN: warm, dry. No rashes. Neuro: No focal deficits Musculoskeletal: Muscle strength 5/5 all ext Psychiatric: Mood and affect normal Neck: No JVD, no carotid bruits, no thyromegaly, no lymphadenopathy. Lungs:Clear bilaterally, no wheezes, rhonci, crackles Cardiovascular: Regular rate and rhythm. No murmurs, gallops or rubs. Abdomen:Soft. Bowel sounds present. Non-tender.  Extremities: No lower extremity edema. Pulses are 2 + in the bilateral DP/PT.  Assessment and Plan:   1. CAD: Stable. No symptoms of unstable angina. Will continue current therapy. BP controlled. He is on a statin. Lipids followed in primary care and controlled per pt. Will arrange exercise stress test since it has been 3 years since last ischemic evaluation.

## 2013-05-01 NOTE — Patient Instructions (Signed)
Your physician wants you to follow-up in:  6 months.  You will receive a reminder letter in the mail two months in advance. If you don't receive a letter, please call our office to schedule the follow-up appointment.  Your physician has requested that you have an exercise tolerance test. To be done with Dr. Angelena Form.  For further information please visit [email or web address]. Please also follow instruction sheet, as given.

## 2013-05-04 ENCOUNTER — Telehealth: Payer: Self-pay | Admitting: Cardiovascular Disease

## 2013-05-04 NOTE — Telephone Encounter (Signed)
New message     Talk to Fraser Din about getting information to disability so that pt can be approved.  Pt knows Fraser Din is out today---ok to call Monday.

## 2013-06-06 ENCOUNTER — Encounter: Payer: BC Managed Care – PPO | Admitting: Cardiovascular Disease

## 2013-06-22 ENCOUNTER — Encounter (INDEPENDENT_AMBULATORY_CARE_PROVIDER_SITE_OTHER): Payer: Self-pay

## 2013-06-22 ENCOUNTER — Ambulatory Visit (INDEPENDENT_AMBULATORY_CARE_PROVIDER_SITE_OTHER): Payer: BC Managed Care – PPO | Admitting: Cardiovascular Disease

## 2013-06-22 DIAGNOSIS — I2510 Atherosclerotic heart disease of native coronary artery without angina pectoris: Secondary | ICD-10-CM

## 2013-06-22 NOTE — Progress Notes (Signed)
Exercise Treadmill Test  Pre-Exercise Testing Evaluation Rhythm: normal sinus  Rate: 73 bpm     Test  Exercise Tolerance Test Ordering MD: Murvin Natal, MD  Interpreting MD: Murvin Natal, MD  Unique Test No: 1  Treadmill:  1  Indication for ETT: known ASHD  Contraindication to ETT: No   Stress Modality: exercise - treadmill  Cardiac Imaging Performed: non   Protocol: standard Bruce - maximal  Max BP:  197/89  Max MPHR (bpm):  161 85% MPR (bpm):  137  MPHR obtained (bpm):  173 % MPHR obtained: 107  Reached 85% MPHR (min:sec):  5:48 Total Exercise Time (min-sec):  8:31  Workload in METS:  10.1 Borg Scale: 15  Reason ETT Terminated:  fatigue    ST Segment Analysis At Rest: normal ST segments - no evidence of significant ST depression With Exercise: no evidence of significant ST depression  Other Information Arrhythmia:  No Angina during ETT:  absent (0) Quality of ETT:  non-diagnostic  ETT Interpretation:  normal - no evidence of ischemia by ST analysis  Comments: Pt exercised for 8:31 minutes of the Bruce protocol. He had no chest pain. No ischemic EKG changes.   Recommendations: No further ischemic workup.

## 2013-09-17 DIAGNOSIS — E119 Type 2 diabetes mellitus without complications: Secondary | ICD-10-CM

## 2013-09-17 HISTORY — DX: Type 2 diabetes mellitus without complications: E11.9

## 2013-09-26 LAB — LIPID PANEL: LDL Cholesterol: 26 mg/dL

## 2013-09-26 LAB — HEMOGLOBIN A1C: HEMOGLOBIN A1C: 8.3 % — AB (ref 4.0–6.0)

## 2013-11-16 ENCOUNTER — Other Ambulatory Visit: Payer: Self-pay | Admitting: Cardiovascular Disease

## 2013-11-27 ENCOUNTER — Ambulatory Visit: Payer: Self-pay | Admitting: Family Medicine

## 2013-12-06 ENCOUNTER — Encounter: Payer: Self-pay | Admitting: Cardiovascular Disease

## 2013-12-06 ENCOUNTER — Ambulatory Visit (INDEPENDENT_AMBULATORY_CARE_PROVIDER_SITE_OTHER): Payer: BC Managed Care – PPO | Admitting: Cardiovascular Disease

## 2013-12-06 VITALS — BP 100/72 | HR 78 | Ht 70.0 in | Wt 165.1 lb

## 2013-12-06 DIAGNOSIS — I10 Essential (primary) hypertension: Secondary | ICD-10-CM

## 2013-12-06 DIAGNOSIS — I2510 Atherosclerotic heart disease of native coronary artery without angina pectoris: Secondary | ICD-10-CM

## 2013-12-06 DIAGNOSIS — F17201 Nicotine dependence, unspecified, in remission: Secondary | ICD-10-CM

## 2013-12-06 DIAGNOSIS — Z87891 Personal history of nicotine dependence: Secondary | ICD-10-CM

## 2013-12-06 NOTE — Patient Instructions (Signed)
Your physician wants you to follow-up in:  6 months. You will receive a reminder letter in the mail two months in advance. If you don't receive a letter, please call our office to schedule the follow-up appointment.   

## 2013-12-06 NOTE — Progress Notes (Signed)
History of Present Illness: 58 yo WM with history of CAD s/p 4V CABG, borderline DM, GERD and nephrolithiasis admitted to [facility] 08/15/08 with an acute anterior STEMI. Emergent cath demonstrated a patent prior stent in the ostial LAD extending into the mid LAD. There was a hazy 99% stenosis in the mid LAD that was felt to be the culprit. PCI failed after multiple attempts, followed by ventricular fibrillation and the pt was taken emergently to the operating room for bypass. Emergent CABG was performed by Dr. Roxan Hockey with 4 vessels bypassed (LIMA to LAD, SVG to D2, SVG to OM2, SVG to distal RCA). After surgery, he had problems with chest wall pain and went to [facility] for evaluation by CT surgery. He underwent sternotomy with removal of sternal wires and revision by Dr. Lunette Stands on 03/28/09. Pre-operatively at [facility], he had a stress echo which showed segmental LV dysfunction with baseline EF of 40%, no stress induced ichemia. CTA showed 2/4 patent grafts (patent LIMA to LAD and patent SVG to OM. The SVG to RCA was occluded and the SVG to the Diagonal was occluded). He was admitted to [facility] 08/09/09 to 08/11/09 with chest pain. Cardiac cath repeated and showed patent LIMA to LAD, patent SVG to OM with occluded SVG to Diagonal and occluded SVG to RCA. EF was 45%. Exercise treadmill stress test August 2012 with no EKG changes with exercise. He was admitted to [facility] April 2014 with abdominal pain and found to have bowel perforation with partial colectomy and had a reversal 10/11/12. Exercise treadmill stress test June 22, 2013 with good exercise tolerance, no ischemic EKG changes. Diagnosed with diabetes spring 2015.   He is here today for follow up. He has been doing well. No chest pain or SOB. Energy level is good. His brother died last week.   Primary Care Physician: Dr. Juluis Pitch at [place]: Followed in primary care.  ( Total chol  102, HDL 27, LDL 26)  Past Medical History  Diagnosis Date  . CAD (coronary artery disease)     s/p emergent 4 V CABG (4/10) w 2/4 patent grafts by cath 08/11/09, patent LIMA to LAD & SVG to OM w moderate disease native RCA & occluded grafts to RCA and diagonal  . 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 GERD (gastroesophageal reflux disease)   . Nephrolithiasis   . Normal echocardiogram 5/10    EF 60, apical & apicallateral HK, PASP 64mmHg. Repeat at [facility] w reported ef of 40%.   . Chest pain     secondary to sternal non-union post CABg-now s/p sternotomy w repair 03/28/09 at [facility], dr. Lunette Stands  . HTN (hypertension)   . 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 Coronary artery bypass graft  08/16/2008    x4  . Tonsillectomy    . Pilonidal sinus tract excision    . Vasectomy      Current Outpatient Prescriptions  Medication Sig Dispense Refill  . ALPRAZolam (XANAX) 0.5 MG tablet Take 0.25 mg by mouth at bedtime as needed. HALF OF TAB .25      . aspirin (ASPIR-81) 81 MG EC tablet Take 81 mg by mouth daily.        Marland Kitchen glucose blood (ONE TOUCH ULTRA TEST) test strip Use 3 (three) times daily.      Marland Kitchen ibuprofen (ADVIL,MOTRIN) 200 MG  tablet Take 200 mg by mouth every 6 (six) hours as needed for pain.      Marland Kitchen losartan (COZAAR) 50 MG tablet TAKE 1 TABLET (50 MG TOTAL) BY MOUTH DAILY.  90 tablet  0  . metFORMIN (GLUCOPHAGE-XR) 750 MG 24 hr tablet Start at 1 tab a day.  Increase to 1 tab twice a day after a week or so if no side effects      . metoprolol (TOPROL-XL) 50 MG 24 hr tablet Take 50 mg by mouth daily.        . nitroGLYCERIN (NITROSTAT) 0.4 MG SL tablet Place 1 tablet (0.4 mg total) under the tongue every 5 (five) minutes as needed.  25 tablet  6  . Omega-3 Fatty Acids (FISH OIL) 1000 MG CAPS Take 2,000 mg by mouth daily.       Marland Kitchen omeprazole (PRILOSEC) 40 MG capsule  Take 40 mg by mouth daily.        . pravastatin (PRAVACHOL) 40 MG tablet Take 40 mg by mouth daily.        . traZODone (DESYREL) 50 MG tablet Take 150 mg by mouth. At bedtiem as needed sleep       No current facility-administered medications for this visit.    Allergies  Allergen Reactions  . Cephalexin Hives  . Keflin [Cephalothin]     rash  . Lamisil [Terbinafine Hcl]     Rash/itchy  . Latex Dermatitis  . Lisinopril     REACTION: cough  . Zolpidem Tartrate     History   Social History  . Marital Status: Married    Spouse Name: N/A    Number of Children: 3  . Years of Education: N/A   Occupational History  . Not on file.   Social History Main Topics  . Smoking status: Former Smoker    Quit date: 07/18/2008  . Smokeless tobacco: Not on file     Comment: No smoking since bypass in 4/10  . Alcohol Use: No       . Drug Use: No  . Sexually Active: Not on file   Other Topics Concern  . Not on file   Social History Narrative       Family History  Problem Relation Age of Onset  . Heart failure Mother   . Heart attack Father   . Heart attack Brother   . Heart attack Brother   . Heart attack Sister     Review of Systems:  As stated in the HPI and otherwise negative.   BP 100/72  Pulse 78  Ht 5\' 10"  (1.778 m)  Wt 165 lb 1.9 oz (74.898 kg)  BMI 23.69 kg/m2  Physical Examination: General: Well developed, well nourished, NAD HEENT: OP clear, mucus membranes moist SKIN: warm, dry. No rashes. Neuro: No focal deficits Musculoskeletal: Muscle strength 5/5 all ext Psychiatric: Mood and affect normal Neck: No JVD, no carotid bruits, no thyromegaly, no lymphadenopathy. Lungs:Clear bilaterally, no wheezes, rhonci, crackles Cardiovascular: Regular rate and rhythm. No murmurs, gallops or rubs. Abdomen:Soft. Bowel sounds present. Non-tender.  Extremities: No lower extremity edema. Pulses are 2 + in the bilateral DP/PT.  Assessment and Plan:   1. CAD: Stable. No  symptoms of unstable angina. Will continue current therapy. BP controlled. He is on a statin. Lipids followed in primary care and controlled per pt.   2. Tobacco abuse, in remission: He stopped smoking in 2010   3. HTN: BP controlled.

## 2013-12-10 ENCOUNTER — Telehealth: Payer: Self-pay | Admitting: *Deleted

## 2013-12-10 ENCOUNTER — Encounter: Payer: Self-pay | Admitting: Cardiovascular Disease

## 2013-12-10 NOTE — Telephone Encounter (Signed)
Spoke with pt and told him letter from [facility] was ready. Pt would like it mailed to him.

## 2013-12-11 ENCOUNTER — Encounter: Payer: Self-pay | Admitting: Podiatry

## 2013-12-11 ENCOUNTER — Ambulatory Visit (INDEPENDENT_AMBULATORY_CARE_PROVIDER_SITE_OTHER): Payer: BC Managed Care – PPO | Admitting: Podiatry

## 2013-12-11 VITALS — BP 133/84 | HR 85 | Resp 16 | Ht 69.0 in | Wt 164.0 lb

## 2013-12-11 DIAGNOSIS — M79609 Pain in unspecified limb: Secondary | ICD-10-CM

## 2013-12-11 DIAGNOSIS — B351 Tinea unguium: Secondary | ICD-10-CM

## 2013-12-11 DIAGNOSIS — L600 Ingrowing nail: Secondary | ICD-10-CM

## 2013-12-11 NOTE — Patient Instructions (Signed)

## 2013-12-11 NOTE — Progress Notes (Signed)
   Subjective:    Patient ID: Joseph [name], male    DOB: 05-03-1955, 58 y.o.   MRN: [id number]  HPI Comments: i have an ingrown toenail on my left big toenail and a fungus on both of my big toenails. i have had it for 1.5 yrs. It has gotten worse. Sometimes the sock will bother my toe. i trim my toenails and use tea tree oil, anti fungal liquid, and vicks vapor rub.     Review of Systems  Constitutional: Positive for fever and chills.  Musculoskeletal:       Joint pain Back pain Muscle pain    Skin:       Thick scars   All other systems reviewed and are negative.      Objective:   Physical Exam        Assessment & Plan:

## 2013-12-11 NOTE — Progress Notes (Signed)
Subjective:     Patient ID: Joseph [name], male   DOB: May 06, 1955, 58 y.o.   MRN: [id number]  HPI patient presents stating I have a painful ingrown toenail on my right toe that I cannot take care of myself and I get thick nails that occur of numerous nails on both feet. Patient states that he has tried vapo rub T. treoil and over-the-counter antifungal   Review of Systems  All other systems reviewed and are negative.      Objective:   Physical Exam  Nursing note and vitals reviewed. Constitutional: He is oriented to person, place, and time.  Cardiovascular: Intact distal pulses.   Musculoskeletal: Normal range of motion.  Neurological: He is oriented to person, place, and time.  Skin: Skin is warm.   neurovascular status found to be intact with muscle strength adequate and range of motion of the subtalar and midtarsal joint within normal limits. Patient is noted to have good digital perfusion and a well developed arch upon weightbearing and is well oriented x3. I noted there to be an incurvated right hallux lateral border this painful and that the patient has yellow nails occurring across proximally 6 nails 3 on both feet with thickness and debris    Assessment:     Chronic ingrown toenail of the right hallux lateral border and mycotic nail infection with thick intolerance of taking Lamisil in the past    Plan:     H&P and conditions reviewed. I have recommended correction of the ingrown toenail and patient wants this done and I explained the procedure going over the risk of surgery. Patient wants to have this fixed understanding risk and today I infiltrated 60 mg Xylocaine Marcaine right hallux exposed the lateral border remove the border exposed the matrix and applied phenol 3 applications 30 seconds followed by alcohol lavaged and sterile dressing. Gave instructions on soaks and also dispensed formula 3 for topical fungus and reappoint again in the future

## 2013-12-18 ENCOUNTER — Ambulatory Visit: Payer: Self-pay | Admitting: Family Medicine

## 2013-12-19 ENCOUNTER — Ambulatory Visit: Payer: Self-pay | Admitting: Family Medicine

## 2013-12-20 ENCOUNTER — Telehealth: Payer: Self-pay | Admitting: Cardiovascular Disease

## 2013-12-20 NOTE — Telephone Encounter (Signed)
New Message  Pt wife called states that the pt has possible Cancer. Requests a call back to discuss with Dr

## 2013-12-20 NOTE — Telephone Encounter (Signed)
SPOKE WITH PT'S WIFE IN GREAT  LENGTH RE  RECENT  DX OF  LUNG  MASS   A COUPLE   HOURS  AGO   PT   IS  SCHEDULED  TO SEE ONOCOLOGISTS  ( DR GETTEN) AT [facility] WISHES IN PUT   FROM  DR [facility]  KNOWS  THAT  WITH  THIS  MD  MORE  THAN  LIKELY    TREATMENT  WOULD BE   TOWARDS  [facility]  DOES NOT   WANT TO  CHANGE  CARDIOLOGISTS WILL FORWARD TO  DR  Angelena Form  FOR  REVIEW .Adonis Housekeeper

## 2013-12-25 ENCOUNTER — Ambulatory Visit: Payer: Self-pay | Admitting: Internal Medicine

## 2013-12-25 LAB — CBC CANCER CENTER
BASOS ABS: 0.1 x10 3/mm (ref 0.0–0.1)
BASOS PCT: 0.8 %
EOS PCT: 1 %
Eosinophil #: 0.1 x10 3/mm (ref 0.0–0.7)
HCT: 39.1 % — ABNORMAL LOW (ref 40.0–52.0)
HGB: 13.1 g/dL (ref 13.0–18.0)
LYMPHS PCT: 13.5 %
Lymphocyte #: 1.6 x10 3/mm (ref 1.0–3.6)
MCH: 30.6 pg (ref 26.0–34.0)
MCHC: 33.4 g/dL (ref 32.0–36.0)
MCV: 92 fL (ref 80–100)
MONOS PCT: 5.6 %
Monocyte #: 0.7 x10 3/mm (ref 0.2–1.0)
NEUTROS PCT: 79.1 %
Neutrophil #: 9.3 x10 3/mm — ABNORMAL HIGH (ref 1.4–6.5)
Platelet: 294 x10 3/mm (ref 150–440)
RBC: 4.27 10*6/uL — ABNORMAL LOW (ref 4.40–5.90)
RDW: 12.9 % (ref 11.5–14.5)
WBC: 11.7 x10 3/mm — AB (ref 3.8–10.6)

## 2013-12-25 LAB — COMPREHENSIVE METABOLIC PANEL
ALBUMIN: 3.5 g/dL (ref 3.4–5.0)
ANION GAP: 8 (ref 7–16)
AST: 44 U/L — AB (ref 15–37)
Alkaline Phosphatase: 94 U/L
BUN: 15 mg/dL (ref 7–18)
Bilirubin,Total: 0.4 mg/dL (ref 0.2–1.0)
CALCIUM: 9.3 mg/dL (ref 8.5–10.1)
CREATININE: 1.04 mg/dL (ref 0.60–1.30)
Chloride: 99 mmol/L (ref 98–107)
Co2: 28 mmol/L (ref 21–32)
EGFR (African American): >60
EGFR (Non-African Amer.): >60
Glucose: 110 mg/dL — ABNORMAL HIGH (ref 65–99)
Osmolality: 272 (ref 275–301)
Potassium: 3.9 mmol/L (ref 3.5–5.1)
SGPT (ALT): 109 U/L — ABNORMAL HIGH
Sodium: 135 mmol/L — ABNORMAL LOW (ref 136–145)
TOTAL PROTEIN: 7.6 g/dL (ref 6.4–8.2)

## 2013-12-25 LAB — PROTIME-INR
INR: 1
PROTHROMBIN TIME: 13.3 s (ref 11.5–14.7)

## 2013-12-25 LAB — MAGNESIUM: Magnesium: 1.3 mg/dL — ABNORMAL LOW

## 2013-12-25 LAB — APTT: ACTIVATED PTT: 35.6 s (ref 23.6–35.9)

## 2013-12-25 NOTE — Telephone Encounter (Signed)
I spoke to Joseph [name] today and let him know that we will be involved with his care. Fraser Din, can we check with the [facility] office about moving up his new patient appt with Dr. Silvio Pate? He is scheduled to see him for the first time in January 2016 but wishes to move this up if possible. Thanks, chris

## 2013-12-26 LAB — CEA: CEA: 1.3 ng/mL (ref 0.0–4.7)

## 2013-12-26 NOTE — Telephone Encounter (Signed)
Okay Make sure this is okay with them

## 2013-12-26 NOTE — Telephone Encounter (Signed)
Dr. Silvio Pate I moved pt's appt to 01/22/14

## 2013-12-26 NOTE — Telephone Encounter (Signed)
Spoke with [facility] office who advised me to send message directly to Dr. Silvio Pate regarding appt.

## 2013-12-26 NOTE — Telephone Encounter (Signed)
Spoke with husband and wife and advised when new appt was. Pt's wife wanted me to go into all of what's going on and I asked her to wait until pt's visit and that we also needed previous MD's records.

## 2013-12-30 LAB — CULTURE, BLOOD (SINGLE)

## 2014-01-03 LAB — MAGNESIUM: MAGNESIUM: 1.6 mg/dL — AB

## 2014-01-03 LAB — CREATININE, SERUM
Creatinine: 1.25 mg/dL (ref 0.60–1.30)
EGFR (African American): >60
EGFR (Non-African Amer.): >60

## 2014-01-06 ENCOUNTER — Other Ambulatory Visit: Payer: Self-pay | Admitting: Cardiovascular Disease

## 2014-01-07 LAB — CBC CANCER CENTER
BASOS PCT: 1.3 %
Basophil #: 0.1 x10 3/mm (ref 0.0–0.1)
Eosinophil #: 0.2 x10 3/mm (ref 0.0–0.7)
Eosinophil %: 3.1 %
HCT: 41.6 % (ref 40.0–52.0)
HGB: 14.1 g/dL (ref 13.0–18.0)
LYMPHS PCT: 24.5 %
Lymphocyte #: 1.7 x10 3/mm (ref 1.0–3.6)
MCH: 31 pg (ref 26.0–34.0)
MCHC: 33.9 g/dL (ref 32.0–36.0)
MCV: 92 fL (ref 80–100)
MONO ABS: 0.4 x10 3/mm (ref 0.2–1.0)
Monocyte %: 5.2 %
Neutrophil #: 4.7 x10 3/mm (ref 1.4–6.5)
Neutrophil %: 65.9 %
Platelet: 222 x10 3/mm (ref 150–440)
RBC: 4.55 10*6/uL (ref 4.40–5.90)
RDW: 13.1 % (ref 11.5–14.5)
WBC: 7.1 x10 3/mm (ref 3.8–10.6)

## 2014-01-07 LAB — CREATININE, SERUM
Creatinine: 1.42 mg/dL — ABNORMAL HIGH (ref 0.60–1.30)
EGFR (Non-African Amer.): 53 — ABNORMAL LOW

## 2014-01-07 LAB — HEPATIC FUNCTION PANEL A (ARMC)
ALBUMIN: 3.7 g/dL (ref 3.4–5.0)
ALK PHOS: 80 U/L
AST: 29 U/L (ref 15–37)
Bilirubin, Direct: <0.05 mg/dL (ref 0.00–0.20)
Bilirubin,Total: 0.3 mg/dL (ref 0.2–1.0)
SGPT (ALT): 85 U/L — ABNORMAL HIGH
Total Protein: 7.6 g/dL (ref 6.4–8.2)

## 2014-01-10 LAB — CREATININE, SERUM
CREATININE: 1.22 mg/dL (ref 0.60–1.30)
EGFR (African American): >60
EGFR (Non-African Amer.): >60

## 2014-01-14 ENCOUNTER — Other Ambulatory Visit: Payer: Self-pay | Admitting: Cardiovascular Disease

## 2014-01-17 ENCOUNTER — Ambulatory Visit: Payer: Self-pay | Admitting: Internal Medicine

## 2014-01-22 ENCOUNTER — Encounter (INDEPENDENT_AMBULATORY_CARE_PROVIDER_SITE_OTHER): Payer: Self-pay

## 2014-01-22 ENCOUNTER — Encounter: Payer: Self-pay | Admitting: Internal Medicine

## 2014-01-22 ENCOUNTER — Ambulatory Visit (INDEPENDENT_AMBULATORY_CARE_PROVIDER_SITE_OTHER): Payer: BC Managed Care – PPO | Admitting: Internal Medicine

## 2014-01-22 VITALS — BP 116/72 | HR 70 | Temp 98.9°F | Resp 14 | Ht 69.0 in | Wt 162.8 lb

## 2014-01-22 DIAGNOSIS — R918 Other nonspecific abnormal finding of lung field: Secondary | ICD-10-CM

## 2014-01-22 DIAGNOSIS — E119 Type 2 diabetes mellitus without complications: Secondary | ICD-10-CM

## 2014-01-22 DIAGNOSIS — K219 Gastro-esophageal reflux disease without esophagitis: Secondary | ICD-10-CM | POA: Insufficient documentation

## 2014-01-22 DIAGNOSIS — E1159 Type 2 diabetes mellitus with other circulatory complications: Secondary | ICD-10-CM | POA: Insufficient documentation

## 2014-01-22 DIAGNOSIS — I10 Essential (primary) hypertension: Secondary | ICD-10-CM

## 2014-01-22 DIAGNOSIS — E785 Hyperlipidemia, unspecified: Secondary | ICD-10-CM

## 2014-01-22 DIAGNOSIS — E1122 Type 2 diabetes mellitus with diabetic chronic kidney disease: Secondary | ICD-10-CM | POA: Insufficient documentation

## 2014-01-22 DIAGNOSIS — I2510 Atherosclerotic heart disease of native coronary artery without angina pectoris: Secondary | ICD-10-CM

## 2014-01-22 LAB — HM DIABETES FOOT EXAM

## 2014-01-22 NOTE — Assessment & Plan Note (Signed)
Recent diagnosis Now controlled apparently on the metformin

## 2014-01-22 NOTE — Assessment & Plan Note (Signed)
Ongoing evaluation by Dr Inez Pilgrim

## 2014-01-22 NOTE — Assessment & Plan Note (Signed)
BP Readings from Last 3 Encounters:  01/22/14 116/72  12/11/13 133/84  12/06/13 100/72   Good control

## 2014-01-22 NOTE — Progress Notes (Signed)
Subjective:    Patient ID: Joseph [name], male    DOB: Dec 17, 1955, 58 y.o.   MRN: [id number]  HPI Here to establish Seeing Dr Lovie Macadamia since 2000 Has trouble getting in to see him lately  Very sick August--- pneumonia Abnormality on CT scan worrisome for cancer. Dr Inez Pilgrim now follows CT scan reviewed--getting follow up CXRs  Recent diagnosis of diabetes made in June Started on metformin Now under good control---checked by Dr Inez Pilgrim  Reviewed his coronary history Stents---then CABG  Current Outpatient Prescriptions on File Prior to Visit  Medication Sig Dispense Refill  . ALPRAZolam (XANAX) 0.5 MG tablet Take 0.25 mg by mouth at bedtime as needed. HALF OF TAB .25      . aspirin (ASPIR-81) 81 MG EC tablet Take 81 mg by mouth daily.        Marland Kitchen glucose blood (ONE TOUCH ULTRA TEST) test strip Use 3 (three) times daily.      Marland Kitchen ibuprofen (ADVIL,MOTRIN) 200 MG tablet Take 200 mg by mouth every 6 (six) hours as needed for pain.      Marland Kitchen losartan (COZAAR) 50 MG tablet TAKE 1 TABLET (50 MG TOTAL) BY MOUTH DAILY.  90 tablet  0  . metFORMIN (GLUCOPHAGE-XR) 750 MG 24 hr tablet Start at 1 tab a day.  Increase to 1 tab twice a day after a week or so if no side effects      . metoprolol (TOPROL-XL) 50 MG 24 hr tablet Take 50 mg by mouth daily.        . Omega-3 Fatty Acids (FISH OIL) 1000 MG CAPS Take 2,000 mg by mouth daily.       Marland Kitchen omeprazole (PRILOSEC) 40 MG capsule Take 40 mg by mouth daily.        . pravastatin (PRAVACHOL) 40 MG tablet Take 40 mg by mouth daily.        . traZODone (DESYREL) 50 MG tablet Take 150 mg by mouth. At bedtiem as needed sleep      . nitroGLYCERIN (NITROSTAT) 0.4 MG SL tablet Place 1 tablet (0.4 mg total) under the tongue every 5 (five) minutes as needed.  25 tablet  6   No current facility-administered medications on file prior to visit.    Allergies  Allergen Reactions  . Cephalexin Hives  . Keflin [Cephalothin]     rash  . Lamisil [Terbinafine Hcl]    Rash/itchy  . Latex Dermatitis  . Lisinopril     REACTION: cough  . Zolpidem Tartrate     Past Medical History  Diagnosis Date  . CAD (coronary artery disease)     s/p emergent 4 V CABG (4/10) w 2/4 patent grafts by cath 08/11/09, patent LIMA to LAD & SVG to OM w moderate disease native RCA & occluded grafts to RCA and diagonal  . 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 GERD (gastroesophageal reflux disease)   . Nephrolithiasis   . Normal echocardiogram 5/10    EF 60, apical & apicallateral HK, PASP 62mmHg. Repeat at [facility] w reported ef of 40%.   . Chest pain     secondary to sternal non-union post CABg-now s/p sternotomy w repair 03/28/09 at [facility], dr. Lunette Stands  . HTN (hypertension)   . 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 Arthritis   . History of chicken pox   . Type 2 diabetes mellitus, controlled 6/15    Past Surgical History  Procedure Laterality Date  . Coronary artery bypass  graft  08/16/2008    x4  . Tonsillectomy    . Pilonidal sinus tract excision    . Vasectomy    . Inguinal hernia repair Left     Dr Ernst Spell  . Sternum separation repair  03/2009    [facility]    Family History  Problem Relation Age of Onset  . Heart failure Mother   . Diabetes Mother   . Cancer Mother     ovarian?  . Heart attack Father   . Heart attack Brother   . Diabetes Brother   . Heart attack Brother   . Diabetes Brother   . Heart attack Sister   . Diabetes Sister     History   Social History  . Marital Status: Married    Spouse Name: N/A    Number of Children: 3  . Years of Education: N/A   Occupational History  . Broadcaster/advertising sales     Disabled   Social History Main Topics  . Smoking status: Former Smoker    Quit date: 07/18/2008  . Smokeless tobacco: Not on file     Comment: No smoking since bypass in 4/10  . Alcohol Use: No     Comment: He does not drink alcohol  . Drug Use: No  .  Sexual Activity: Not on file   Other Topics Concern  . Not on file   Social History Narrative   No living will   Wife is health care POA   Would accept resuscitation   Not sure about tube feeds--probably not prolonged   Review of Systems  Constitutional: Negative for fatigue.       Wears seat belt  HENT: Positive for hearing loss. Negative for dental problem.   Eyes: Negative for visual disturbance.       Did notice some vision changes in left when diabetes diagnosed. Due for eye exam  Respiratory: Positive for cough. Negative for chest tightness and shortness of breath.   Cardiovascular: Negative for chest pain, palpitations and leg swelling.  Gastrointestinal: Negative for nausea, constipation and blood in stool.  Endocrine: Negative for polydipsia and polyuria.  Genitourinary: Negative for urgency and difficulty urinating.  Musculoskeletal: Positive for arthralgias and back pain.       Knees and hips Chronic back pain  Skin:       Slight rosacea--uses OTC cortisone cream  Allergic/Immunologic: Negative for environmental allergies and immunocompromised state.  Neurological: Positive for headaches. Negative for dizziness, weakness and light-headedness.       Rare headaches  Hematological: Negative for adenopathy. Does not bruise/bleed easily.  Psychiatric/Behavioral: Positive for sleep disturbance. Negative for dysphoric mood. The patient is not nervous/anxious.        Chronic insomnia-- has xanax and trazodone       Objective:   Physical Exam  Constitutional: He is oriented to person, place, and time. He appears well-developed and well-nourished. No distress.  HENT:  Mouth/Throat: Oropharynx is clear and moist. No oropharyngeal exudate.  Neck: Normal range of motion. Neck supple. No thyromegaly present.  Cardiovascular: Normal rate, regular rhythm, normal heart sounds and intact distal pulses.  Exam reveals no gallop.   No murmur heard. Pulmonary/Chest: Effort normal and  breath sounds normal. No respiratory distress. He has no wheezes. He has no rales.  Abdominal: Soft. He exhibits no distension. There is no tenderness. There is no rebound and no guarding.  Musculoskeletal: He exhibits no edema and no tenderness.  Lymphadenopathy:    He has no cervical  adenopathy.  Neurological: He is alert and oriented to person, place, and time.  Skin: No rash noted. No erythema.  No foot lesions  Psychiatric: He has a normal mood and affect. His behavior is normal.          Assessment & Plan:

## 2014-01-22 NOTE — Progress Notes (Signed)
Pre visit review using our clinic review tool, if applicable. No additional management support is needed unless otherwise documented below in the visit note. 

## 2014-01-22 NOTE — Assessment & Plan Note (Signed)
LDL low on the statin

## 2014-01-22 NOTE — Assessment & Plan Note (Signed)
Has been controlled since CABG

## 2014-01-23 ENCOUNTER — Telehealth: Payer: Self-pay | Admitting: Internal Medicine

## 2014-01-23 NOTE — Telephone Encounter (Signed)
emmi emailed °

## 2014-01-28 ENCOUNTER — Other Ambulatory Visit: Payer: Self-pay

## 2014-02-17 ENCOUNTER — Ambulatory Visit: Payer: Self-pay | Admitting: Internal Medicine

## 2014-02-27 ENCOUNTER — Telehealth: Payer: Self-pay | Admitting: Cardiovascular Disease

## 2014-02-27 NOTE — Telephone Encounter (Signed)
Walk-In patient form completed at check-in and attached the patient's medical source statement questionaire form: delivered to Pat/ Dr. McAlhany// POD H 11.11.15:djc

## 2014-02-27 NOTE — Telephone Encounter (Signed)
New problem    Pt need to speak to nurse concerning disability forms. Please call pt.

## 2014-02-27 NOTE — Telephone Encounter (Signed)
Spoke with pt who reports he has disability paperwork for Dr. Angelena Form to complete. He will bring to office and give to medical records

## 2014-03-08 ENCOUNTER — Telehealth: Payer: Self-pay | Admitting: Cardiovascular Disease

## 2014-03-08 NOTE — Telephone Encounter (Signed)
On 11.19.15, Disability Claims Recovery Medical Source Statement was placed at my station from [place].  Called the patient on 11.20.15 at 4:30 to advise of completion. Patient will pick up early next week. Form in the HIM department:djc

## 2014-03-11 ENCOUNTER — Telehealth: Payer: Self-pay | Admitting: Cardiovascular Disease

## 2014-03-11 NOTE — Telephone Encounter (Signed)
Pt Picked up Disability Claims Recovery paperwork.  11.23.15/km

## 2014-03-21 ENCOUNTER — Ambulatory Visit: Payer: Self-pay | Admitting: Internal Medicine

## 2014-03-22 LAB — HM DIABETES EYE EXAM

## 2014-03-25 ENCOUNTER — Ambulatory Visit: Payer: Self-pay | Admitting: Internal Medicine

## 2014-03-25 ENCOUNTER — Other Ambulatory Visit: Payer: Self-pay | Admitting: Internal Medicine

## 2014-03-25 LAB — CREATININE, SERUM: Creatinine: 1.13 mg/dL (ref 0.60–1.30)

## 2014-03-29 ENCOUNTER — Encounter: Payer: Self-pay | Admitting: Internal Medicine

## 2014-04-01 LAB — CREATININE, SERUM
Creatinine: 1.16 mg/dL (ref 0.60–1.30)
EGFR (African American): >60

## 2014-04-05 ENCOUNTER — Encounter: Payer: Self-pay | Admitting: Internal Medicine

## 2014-04-09 ENCOUNTER — Ambulatory Visit (INDEPENDENT_AMBULATORY_CARE_PROVIDER_SITE_OTHER): Payer: BC Managed Care – PPO | Admitting: Podiatry

## 2014-04-09 ENCOUNTER — Ambulatory Visit: Payer: Self-pay

## 2014-04-09 DIAGNOSIS — L600 Ingrowing nail: Secondary | ICD-10-CM

## 2014-04-09 DIAGNOSIS — M722 Plantar fascial fibromatosis: Secondary | ICD-10-CM

## 2014-04-09 DIAGNOSIS — M779 Enthesopathy, unspecified: Secondary | ICD-10-CM

## 2014-04-09 MED ORDER — TRIAMCINOLONE ACETONIDE 10 MG/ML IJ SUSP
10.0000 mg | Freq: Once | INTRAMUSCULAR | Status: AC
  Administered 2014-04-09: 10 mg

## 2014-04-09 NOTE — Patient Instructions (Addendum)
ANTIBACTERIAL SOAP INSTRUCTIONS  THE DAY AFTER PROCEDURE  Please follow the instructions your doctor has marked.   Shower as usual. Before getting out, place a drop of antibacterial liquid soap (Dial) on a wet, clean washcloth.  Gently wipe washcloth over affected area.  Afterward, rinse the area with warm water.  Blot the area dry with a soft cloth and cover with antibiotic ointment (neosporin, polysporin, bacitracin) and band aid or gauze and tape Place 3-4 drops of antibacterial liquid soap in a quart of warm tap water.  Submerge foot into water for 20 minutes.  If bandage was applied after your procedure, leave on to allow for easy lift off, then remove and continue with soak for the remaining time.  Next, blot area dry with a soft cloth and cover with a bandage.  Apply other medications as directed by your doctor, such as cortisporin otic solution (eardrops) or neosporin antibiotic ointmentBetadine Soak Instructions  Purchase an 8 oz. bottle of BETADINE solution (Povidone)  THE DAY AFTER THE PROCEDURE  Place 1 tablespoon of betadine solution in a quart of warm tap water.  Submerge your foot or feet with outer bandage intact for the initial soak; this will allow the bandage to become moist and wet for easy lift off.  Once you remove your bandage, continue to soak in the solution for 20 minutes.  This soak should be done twice a day.  Next, remove your foot or feet from solution, blot dry the affected area and cover.  You may use a band aid large enough to cover the area or use gauze and tape.  Apply other medications to the area as directed by the doctor such as cortisporin otic solution (ear drops) or neosporin.  IF YOUR SKIN BECOMES IRRITATED WHILE USING THESE INSTRUCTIONS, IT IS OKAY TO SWITCH TO EPSOM SALTS AND WATER OR WHITE VINEGAR AND WATER.[place] Instructions-Post Nail Surgery   You have had your ingrown toenail and root treated with a chemical.  This chemical causes a burn  that will drain and ooze like a blister.  This can drain for 6-8 weeks or longer.  It is important to keep this area clean, covered, and follow the soaking instructions dispensed at the time of your surgery.  This area will eventually dry and form a scab.  Once the scab forms you no longer need to soak or apply a dressing.  If at any time you experience an increase in pain, redness, swelling, or drainage, you should contact the office as soon as possible.

## 2014-04-10 NOTE — Progress Notes (Signed)
Subjective:     Patient ID: Joseph [name], male   DOB: [birth year], 58 y.o.   MRN: [id number]  HPI patient presents stating I'm having a lot of pain in my big toe nail left like my right one and also I'm having pain on top of my left foot that's been bothering me and my heel occasionally bothers me on both feet   Review of Systems     Objective:   Physical Exam Neurovascular status intact with muscle strength adequate in range of motion within normal limits. Patient's noted to have incurvated medial border left hallux that's painful when pressed and has pain on the dorsum of the left ankle around the anterior tibial tendon and across the ankle joint. Moderate heel discomfort is noted bilateral but minimal in comparison to other problems    Assessment:     Chronic ingrown toenail deformity left hallux medial border and tendinitis of the left dorsal foot that sore when pressed    Plan:     H&P and condition discussed and reviewed the ankle capsule and today injected into the ankle capsule 3 mg Kenalog 5 mg Xylocaine and also discussed correction of ingrown toenail which she wants fixed. Reviewed surgery and risk and he wants to procedure and today I infiltrated 60 mg Xylocaine Marcaine mixture remove the medial border exposed matrix and applied phenol 30 seconds followed by 3 applications and alcohol lavage. Applied sterile dressing and gave instructions on soaks and reappoint. His mother is in failing health and is most likely going to pass away in the next couple days and I discussed return to activity

## 2014-04-16 ENCOUNTER — Ambulatory Visit (INDEPENDENT_AMBULATORY_CARE_PROVIDER_SITE_OTHER): Payer: BC Managed Care – PPO | Admitting: Internal Medicine

## 2014-04-16 ENCOUNTER — Encounter: Payer: Self-pay | Admitting: Internal Medicine

## 2014-04-16 VITALS — BP 128/80 | HR 69 | Temp 97.4°F | Wt 167.0 lb

## 2014-04-16 DIAGNOSIS — J069 Acute upper respiratory infection, unspecified: Secondary | ICD-10-CM | POA: Insufficient documentation

## 2014-04-16 MED ORDER — AMOXICILLIN 500 MG PO TABS: 1000.0000 mg | ORAL_TABLET | Freq: Two times a day (BID) | ORAL | Status: DC

## 2014-04-16 NOTE — Assessment & Plan Note (Signed)
Seems viral but has had some trouble with pneumonia I would be more concerned about secondary sinus infection Discussed supportive care If worsens, will start amoxicillin

## 2014-04-16 NOTE — Progress Notes (Signed)
Pre visit review using our clinic review tool, if applicable. No additional management support is needed unless otherwise documented below in the visit note. 

## 2014-04-16 NOTE — Progress Notes (Signed)
Subjective:    Patient ID: Joseph [name], male    DOB: 12-May-1955, 58 y.o.   MRN: [id number]  HPI Here for respiratory symptoms  Has a cold but is concerned due to past pneumonia No fever Rhinorrhea and watery eyes Sore throat Sneezing and some sinus drainage Sick for about 4 days Some cough--- dry No SOB  Is due for 1 more CT scan to be sure nothing to worry about with the lung mass  Got hydrocodone syrup but hasn't taken Has used some benzonatate  Current Outpatient Prescriptions on File Prior to Visit  Medication Sig Dispense Refill  . ALPRAZolam (XANAX) 0.5 MG tablet Take 0.25 mg by mouth at bedtime as needed. HALF OF TAB .25    . aspirin (ASPIR-81) 81 MG EC tablet Take 81 mg by mouth daily.      Marland Kitchen glucose blood (ONE TOUCH ULTRA TEST) test strip Use 3 (three) times daily.    Marland Kitchen ibuprofen (ADVIL,MOTRIN) 200 MG tablet Take 200 mg by mouth every 6 (six) hours as needed for pain.    Marland Kitchen losartan (COZAAR) 50 MG tablet TAKE 1 TABLET (50 MG TOTAL) BY MOUTH DAILY. 90 tablet 0  . metFORMIN (GLUCOPHAGE-XR) 750 MG 24 hr tablet Take 1 tablet (750 mg total) by mouth 2 (two) times daily. 60 tablet 11  . metoprolol (TOPROL-XL) 50 MG 24 hr tablet Take 50 mg by mouth daily.      . Omega-3 Fatty Acids (FISH OIL) 1000 MG CAPS Take 2,000 mg by mouth daily.     Marland Kitchen omeprazole (PRILOSEC) 40 MG capsule Take 40 mg by mouth daily.      . pravastatin (PRAVACHOL) 40 MG tablet Take 40 mg by mouth daily.       No current facility-administered medications on file prior to visit.    Allergies  Allergen Reactions  . Cephalexin Hives  . Keflin [Cephalothin]     rash  . Lamisil [Terbinafine Hcl]     Rash/itchy  . Latex Dermatitis  . Lisinopril     REACTION: cough  . Zolpidem Tartrate     Past Medical History  Diagnosis Date  . CAD (coronary artery disease)     s/p emergent 4 V CABG (4/10) w 2/4 patent grafts by cath 08/11/09, patent LIMA to LAD & SVG to OM w moderate disease native RCA & occluded  grafts to RCA and diagonal  . 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 Nephrolithiasis   . Normal echocardiogram 5/10    EF 60, apical & apicallateral HK, PASP 73mmHg. Repeat at [facility] w reported ef of 40%.   . Chest pain     secondary to sternal non-union post CABg-now s/p sternotomy w repair 03/28/09 at [facility], dr. Lunette Stands  . HTN (hypertension)   . 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 Arthritis   . History of chicken pox   . Type 2 diabetes mellitus, controlled 6/15  . GERD (gastroesophageal reflux disease)     with Schatzki ring    Past Surgical History  Procedure Laterality Date  . Coronary artery bypass graft  08/16/2008    x4  . Tonsillectomy    . Pilonidal sinus tract excision    . Vasectomy    . Inguinal hernia repair Left     Dr Ernst Spell  . Sternum separation repair  03/2009    [facility]  . Ileostomy  2014    with reversal-- after diverticulitis. Sigmoid colectomy also. Dr Ely/Bird  .  Esophageal dilation  ~2009    Dr Earlean Shawl    Family History  Problem Relation Age of Onset  . Heart failure Mother   . Diabetes Mother   . Cancer Mother     ovarian?  . Heart attack Father   . Heart attack Brother   . Diabetes Brother   . Heart attack Brother   . Diabetes Brother   . Heart attack Sister   . Diabetes Sister   . Cancer Maternal Aunt     died of breast cancer  . Cancer Maternal Grandmother     died of bone cancer    History   Social History  . Marital Status: Married    Spouse Name: N/A    Number of Children: 3  . Years of Education: N/A   Occupational History  . Broadcaster/advertising sales     Disabled   Social History Main Topics  . Smoking status: Former Smoker    Quit date: 07/18/2008  . Smokeless tobacco: Never Used     Comment: No smoking since bypass in 4/10  . Alcohol Use: No     Comment: He does not drink alcohol  . Drug Use: No  . Sexual Activity: Not on file   Other  Topics Concern  . Not on file   Social History Narrative   No living will   Wife is health care POA   Would accept resuscitation   Not sure about tube feeds--probably not prolonged   Review of Systems  No vomiting or diarrhea Appetite off slightly No rash     Objective:   Physical Exam  Constitutional: He appears well-developed and well-nourished. No distress.  HENT:  Mouth/Throat: Oropharynx is clear and moist. No oropharyngeal exudate.  Mild frontal tenderness Moderate nasal inflammation TMs normal  Neck: Normal range of motion. Neck supple. No thyromegaly present.  Cardiovascular: Normal rate.   Pulmonary/Chest: Effort normal and breath sounds normal. No respiratory distress. He has no wheezes. He has no rales.  Lymphadenopathy:    He has no cervical adenopathy.          Assessment & Plan:

## 2014-04-19 ENCOUNTER — Ambulatory Visit: Payer: Self-pay | Admitting: Internal Medicine

## 2014-04-19 DIAGNOSIS — J189 Pneumonia, unspecified organism: Secondary | ICD-10-CM

## 2014-04-19 HISTORY — DX: Pneumonia, unspecified organism: J18.9

## 2014-04-20 ENCOUNTER — Other Ambulatory Visit: Payer: Self-pay | Admitting: Cardiovascular Disease

## 2014-04-21 ENCOUNTER — Other Ambulatory Visit: Payer: Self-pay | Admitting: Internal Medicine

## 2014-04-26 ENCOUNTER — Ambulatory Visit (INDEPENDENT_AMBULATORY_CARE_PROVIDER_SITE_OTHER): Payer: BC Managed Care – PPO | Admitting: Podiatry

## 2014-04-26 DIAGNOSIS — M722 Plantar fascial fibromatosis: Secondary | ICD-10-CM

## 2014-04-26 DIAGNOSIS — M779 Enthesopathy, unspecified: Secondary | ICD-10-CM

## 2014-04-26 MED ORDER — TRIAMCINOLONE ACETONIDE 10 MG/ML IJ SUSP
10.0000 mg | Freq: Once | INTRAMUSCULAR | Status: AC
  Administered 2014-04-26: 10 mg

## 2014-04-28 NOTE — Progress Notes (Signed)
Subjective:     Patient ID: Joseph [name], male   DOB: Nov 11, 1955, 59 y.o.   MRN: [id number]  HPI patient states I seem to be doing okay but I have an area that still really bothers me no longer in the tendon but in the left ankle joint. Nails been healing well   Review of Systems     Objective:   Physical Exam Neurovascular status intact with discomfort that's reduced in the anterior tibial tendon left and ingrown toenail that's doing well left with minimal drainage but does have discomfort in the left ankle from medial to lateral side    Assessment:     Inflammatory changes of the left ankle with improving tendinitis ingrown toenail    Plan:     Reviewed condition and did careful injection of the ankle joint left 3 mg Kenalog 5 mill grams Xylocaine and advised on reduced activity. Reappoint for [place] to recheck if symptoms persist

## 2014-04-30 ENCOUNTER — Ambulatory Visit: Payer: BC Managed Care – PPO | Admitting: Internal Medicine

## 2014-05-14 ENCOUNTER — Telehealth: Payer: Self-pay

## 2014-05-14 NOTE — Telephone Encounter (Signed)
Spoke with patient and advised results   

## 2014-05-14 NOTE — Telephone Encounter (Signed)
Pt left v/m; pt has f/u appt with Dr Silvio Pate in 07/2014; pts wife is scheduled for shingles vaccine in near future; pt wants to know if his wife receiving the shingles vaccine will put pt at any risk in any way; pt recently had pneumonia and something seen on CXR. Pt has CT scan of chest scheduled for 07/03/14. Pt request cb.

## 2014-05-14 NOTE — Telephone Encounter (Signed)
Unless he was on some immunosuppressive medication (like for cancer treatment), he shouldn't have a problem with her getting the shingles vaccine----as long as he had chicken pox as a child. If not, he could be at risk of having a mild form of chicken pox but it would be very unlikely (the vaccine uses a weakened chicken pox virus)

## 2014-05-20 ENCOUNTER — Other Ambulatory Visit: Payer: Self-pay | Admitting: Internal Medicine

## 2014-05-20 NOTE — Telephone Encounter (Signed)
Approved: #[id number] x 0 

## 2014-05-20 NOTE — Telephone Encounter (Signed)
Medication phoned to pharmacy.  

## 2014-05-20 NOTE — Telephone Encounter (Signed)
Electronic Rx request for xanax received. Patient's last office visit was 04/16/14. Rx has not been filled by Dr. Silvio Pate before. Please advise.

## 2014-05-27 IMAGING — CT CT ABD-PELV W/ CM
1 of 3 series · 14 of 32 positions shown, 19 images · non-contrast
Comparison: none

REASON FOR EXAM: (1) IV CONTRAST ONLY; DIFFUSE ABD PAIN; (2) MAXIMUM
LOWER ABD
COMMENTS:   May transport without cardiac monitor

[Series 2: 3mm soft tissue · axial · 0.71mm/px · z∈[-504,-75]mm · 14 of 161 slices shown, 19 images]
[im 9/161  soft-tissue]
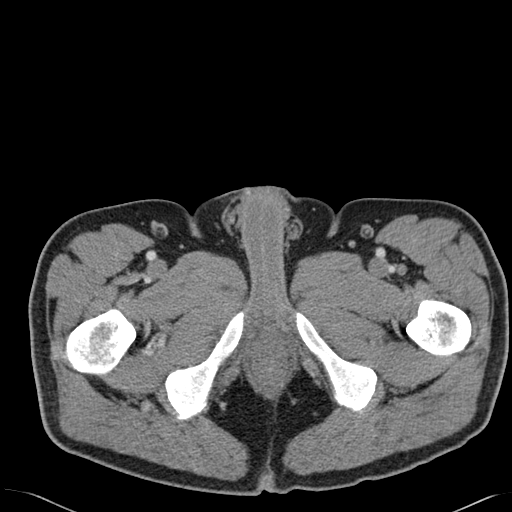
[im 9/161  bone]
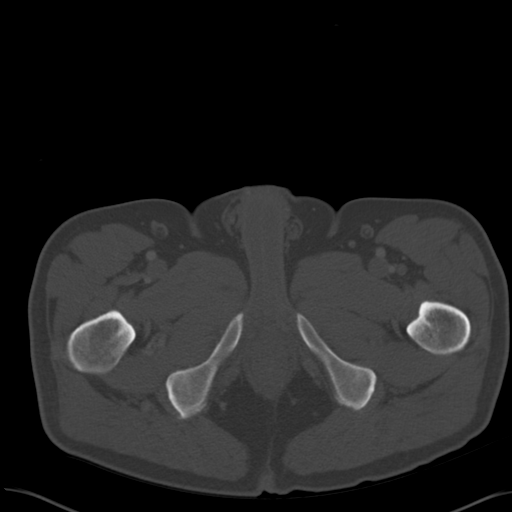
[im 26/161  soft-tissue]
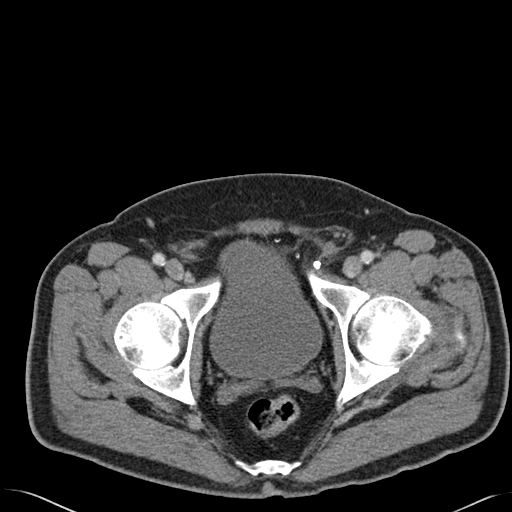
[im 34/161  soft-tissue]
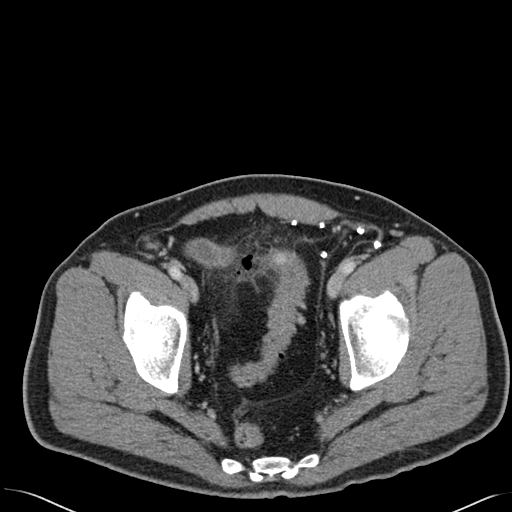
[im 43/161  soft-tissue]
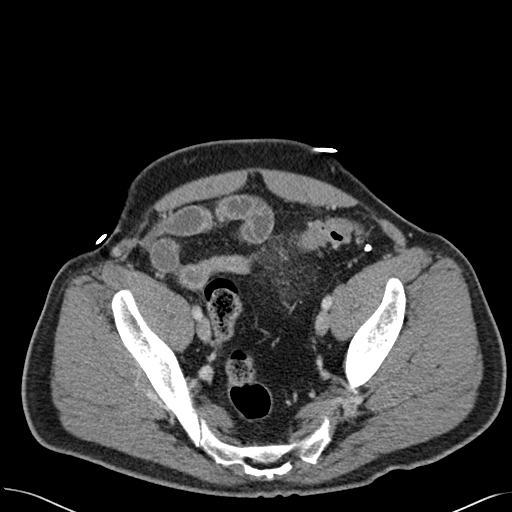
[im 59/161  soft-tissue]
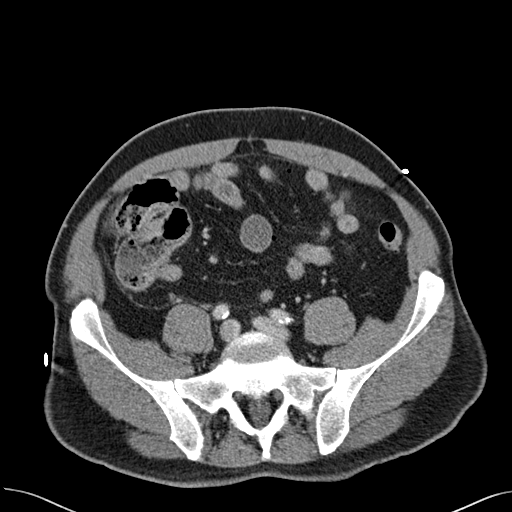
[im 68/161  soft-tissue]
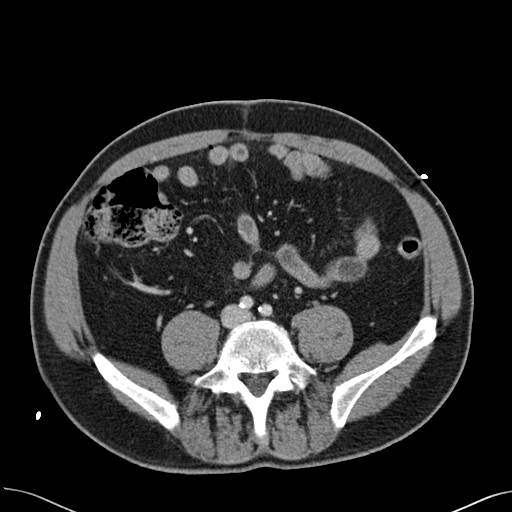
[im 85/161  soft-tissue]
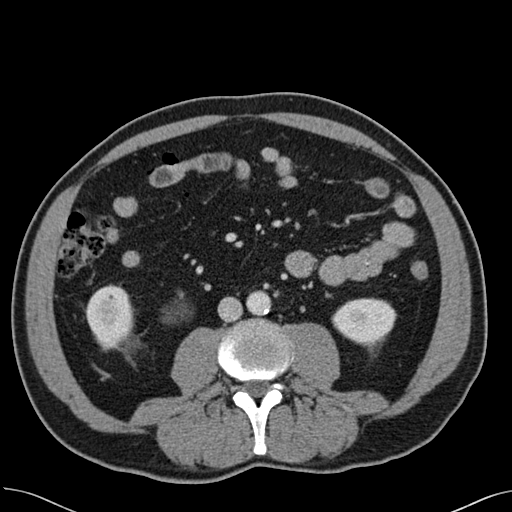
[im 93/161  soft-tissue]
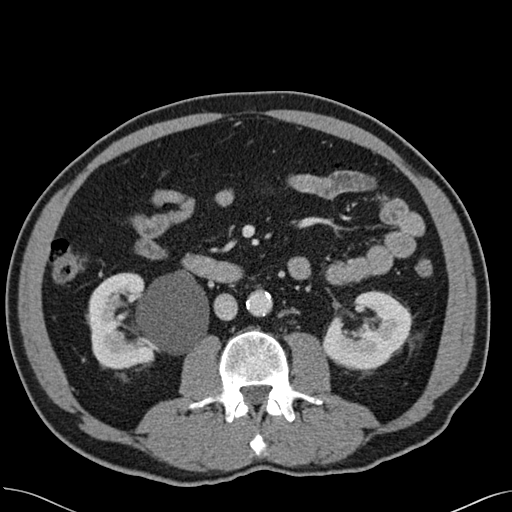
[im 102/161  soft-tissue]
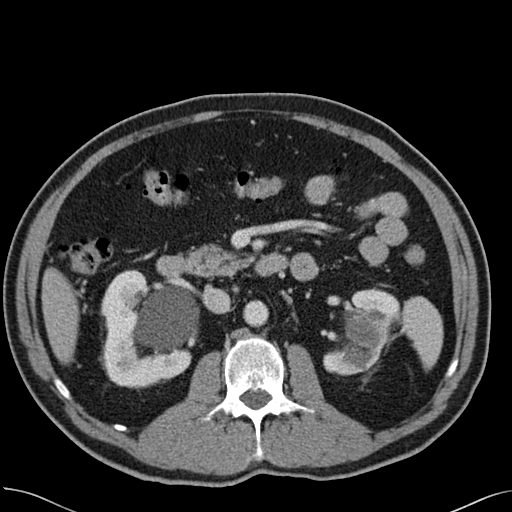
[im 102/161  bone]
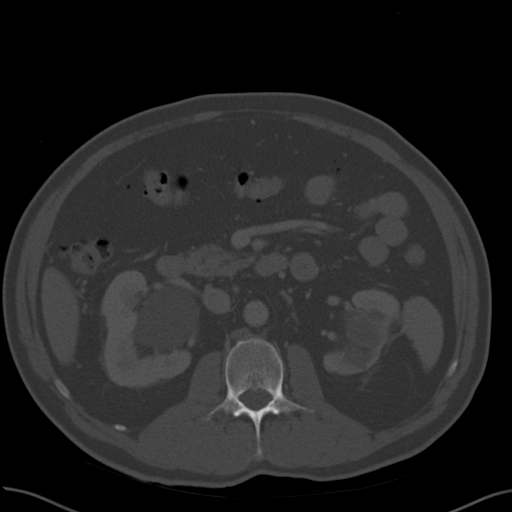
[im 118/161  soft-tissue]
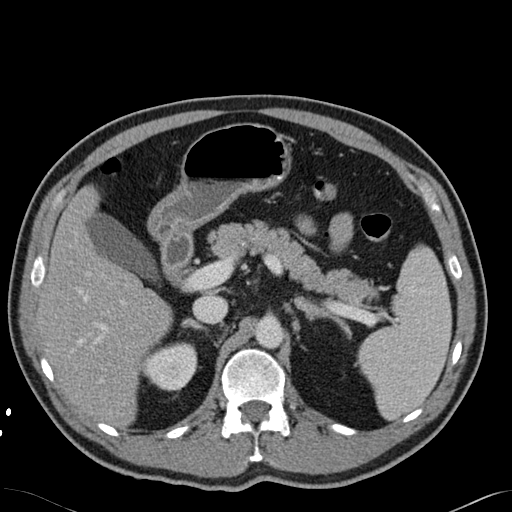
[im 127/161  soft-tissue]
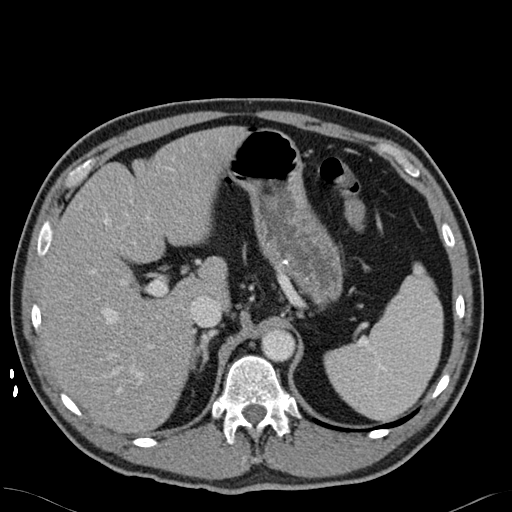
[im 127/161  lung]
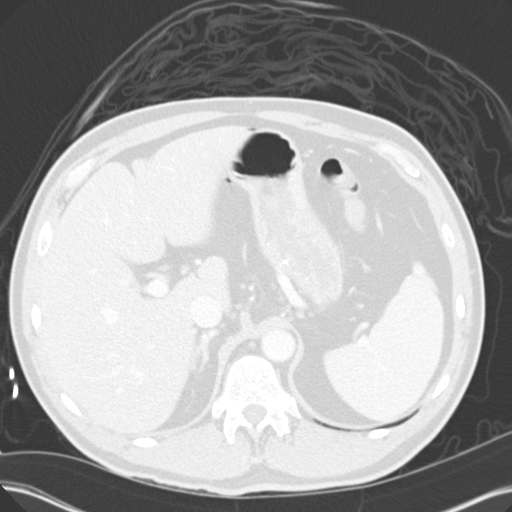
[im 135/161  soft-tissue]
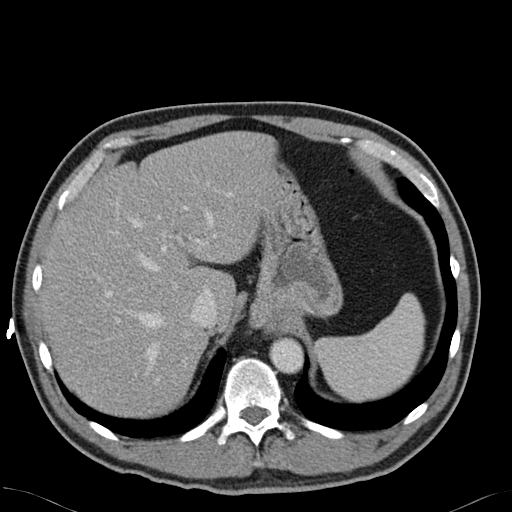
[im 135/161  lung]
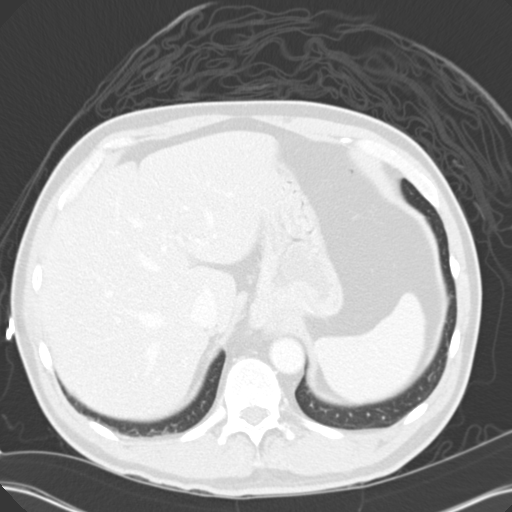
[im 144/161  lung]
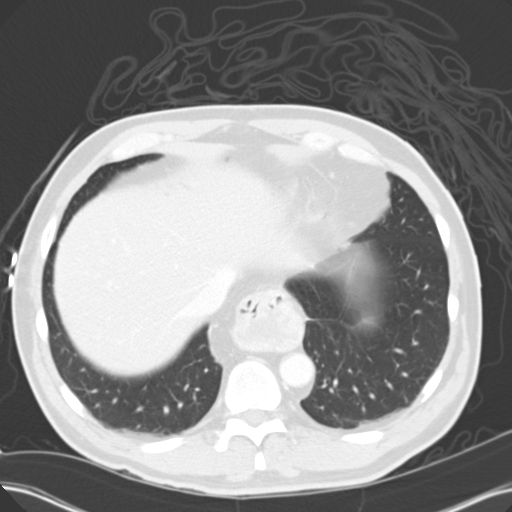
[im 152/161  soft-tissue]
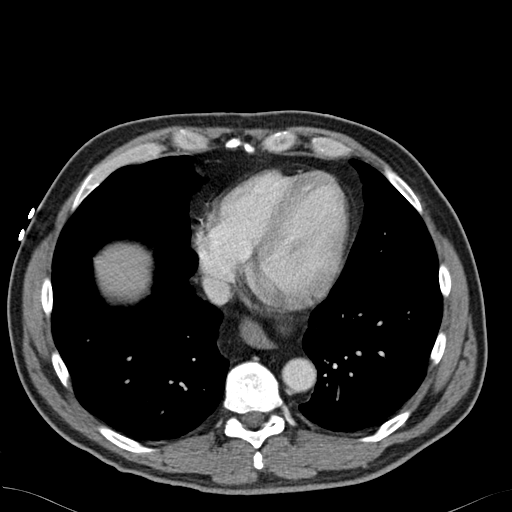
[im 152/161  lung]
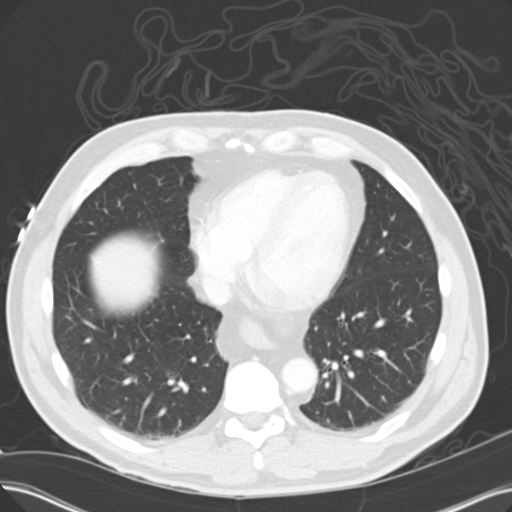

[14 of 32 positions shown; findings below may reference images not displayed]

PROCEDURE:     CT  - CT ABDOMEN / PELVIS  W  - August 05, 2012  [DATE]

RESULT:     CT of the abdomen and pelvis is performed with 100 mL of
Csovue-GHH iodinated intravenous contrast without oral contrast.
Postcontrast delayed images are performed. Reconstructions are made in the
axial plane at 3 mm slice thickness and compared to a previous multiphase
exam dated 29 July, 2005.

Bilateral renal cysts are present with a large parapelvic right renal cyst
in the mid to lower pole region measuring up to 5.8 cm. There to upper pole
left renal cysts with the largest measuring up to 3.8 cm. There is no
cholelithiasis. There is inflammatory stranding adjacent to the sigmoid
colon with multiple diverticula in the region consistent with acute
diverticulitis. There are small bubbles of air adjacent to the colon
consistent with microperforation. No abscess is evident. There is no
abnormal bowel distention. There is no hydronephrosis. The liver, spleen,
pancreas, adrenal glands and abdominal aorta appear unremarkable. The bony
structures are within normal limits. A normal appearing appendix is present.
There is minimal dependent atelectasis in both lower lobes. There is a
moderately large hiatal hernia.
IMPRESSION: 1. Findings consistent with acute diverticulitis. Microperforation appears
present. There is no abscess.
2. Multiple bilateral renal cysts.
3. Moderately large hiatal hernia.

[REDACTED]

## 2014-05-31 ENCOUNTER — Other Ambulatory Visit: Payer: Self-pay | Admitting: Internal Medicine

## 2014-05-31 NOTE — Telephone Encounter (Signed)
Approved: okay to fill for a year 

## 2014-05-31 NOTE — Telephone Encounter (Signed)
Pt establish care 01/2014. Ok to refill?

## 2014-06-10 ENCOUNTER — Ambulatory Visit (INDEPENDENT_AMBULATORY_CARE_PROVIDER_SITE_OTHER): Payer: BLUE CROSS/BLUE SHIELD | Admitting: Cardiovascular Disease

## 2014-06-10 ENCOUNTER — Encounter: Payer: Self-pay | Admitting: Cardiovascular Disease

## 2014-06-10 VITALS — BP 114/82 | HR 61 | Ht 65.0 in | Wt 168.0 lb

## 2014-06-10 DIAGNOSIS — I10 Essential (primary) hypertension: Secondary | ICD-10-CM

## 2014-06-10 DIAGNOSIS — I2510 Atherosclerotic heart disease of native coronary artery without angina pectoris: Secondary | ICD-10-CM

## 2014-06-10 DIAGNOSIS — F17201 Nicotine dependence, unspecified, in remission: Secondary | ICD-10-CM

## 2014-06-10 MED ORDER — LOSARTAN POTASSIUM 50 MG PO TABS: 50.0000 mg | ORAL_TABLET | Freq: Every day | ORAL | Status: DC

## 2014-06-10 MED ORDER — NITROGLYCERIN 0.4 MG SL SUBL: 0.4000 mg | SUBLINGUAL_TABLET | SUBLINGUAL | Status: DC | PRN

## 2014-06-10 NOTE — Patient Instructions (Signed)
Your physician wants you to follow-up in:  6 months. You will receive a reminder letter in the mail two months in advance. If you don't receive a letter, please call our office to schedule the follow-up appointment.   

## 2014-06-10 NOTE — Progress Notes (Signed)
History of Present Illness: 59 yo WM with history of CAD s/p 4V CABG, borderline DM, GERD and nephrolithiasis admitted to [facility] 08/15/08 with an acute anterior STEMI. Emergent cath demonstrated a patent prior stent in the ostial LAD extending into the mid LAD. There was a hazy 99% stenosis in the mid LAD that was felt to be the culprit. PCI failed after multiple attempts, followed by ventricular fibrillation and the pt was taken emergently to the operating room for bypass. Emergent CABG was performed by Dr. Roxan Hockey with 4 vessels bypassed (LIMA to LAD, SVG to D2, SVG to OM2, SVG to distal RCA). After surgery, he had problems with chest wall pain and went to [facility] for evaluation by CT surgery. He underwent sternotomy with removal of sternal wires and revision by Dr. Lunette Stands on 03/28/09. Pre-operatively at [facility], he had a stress echo which showed segmental LV dysfunction with baseline EF of 40%, no stress induced ichemia. CTA showed 2/4 patent grafts (patent LIMA to LAD and patent SVG to OM. The SVG to RCA was occluded and the SVG to the Diagonal was occluded). He was admitted to [facility] 08/09/09 to 08/11/09 with chest pain. Cardiac cath repeated and showed patent LIMA to LAD, patent SVG to OM with occluded SVG to Diagonal and occluded SVG to RCA. EF was 45%. Exercise treadmill stress test August 2012 with no EKG changes with exercise. He was admitted to [facility] April 2014 with abdominal pain and found to have bowel perforation with partial colectomy and had a reversal 10/11/12. Exercise treadmill stress test June 22, 2013 with good exercise tolerance, no ischemic EKG changes. Diagnosed with diabetes spring 2015.   He is here today for follow up. He has been doing well. No chest pain or SOB. Energy level is good. He had pneumonia in September 2015 and is over that now. His mother recently died.   Primary Care Physician: Dr. Silvio Pate  Last Lipid Profile:  LDL 12 October 2013  Past Medical History  Diagnosis Date  . CAD (coronary artery disease)     s/p emergent 4 V CABG (4/10) w 2/4 patent grafts by cath 08/11/09, patent LIMA to LAD & SVG to OM w moderate disease native RCA & occluded grafts to RCA and diagonal  . 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 Nephrolithiasis   . Normal echocardiogram 5/10    EF 60, apical & apicallateral HK, PASP 70mmHg. Repeat at [facility] w reported ef of 40%.   . Chest pain     secondary to sternal non-union post CABg-now s/p sternotomy w repair 03/28/09 at [facility], dr. Lunette Stands  . HTN (hypertension)   . 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 Arthritis   . History of chicken pox   . Type 2 diabetes mellitus, controlled 6/15  . GERD (gastroesophageal reflux disease)     with Schatzki ring    Past Surgical History  Procedure Laterality Date  . Coronary artery bypass graft  08/16/2008    x4  . Tonsillectomy    . Pilonidal sinus tract excision    . Vasectomy    . Inguinal hernia repair Left     Dr Ernst Spell  . Sternum separation repair  03/2009    [facility]  . Ileostomy  2014    with reversal-- after diverticulitis. Sigmoid colectomy also. Dr Ely/Bird  . Esophageal dilation  ~2009    Dr Earlean Shawl    Current Outpatient Prescriptions  Medication Sig Dispense Refill  . ALPRAZolam (XANAX) 0.5 MG tablet TAKE 1 TABLET BY MOUTH EVERY 12 HOURS AS NEEDED (Patient taking differently: TAKE 1 TABLET BY MOUTH EVERY 12 HOURS AS NEEDED FOR ANXIETY) 90 tablet 0  . aspirin (ASPIR-81) 81 MG EC tablet Take 81 mg by mouth daily.      Marland Kitchen glucose blood (ONE TOUCH ULTRA TEST) test strip Use 3 (three) times daily.    Marland Kitchen ibuprofen (ADVIL,MOTRIN) 200 MG tablet Take 200 mg by mouth every 6 (six) hours as needed for pain.    Marland Kitchen losartan (COZAAR) 50 MG tablet TAKE 1 TABLET BY MOUTH EVERY DAY 90 tablet 0  . metFORMIN (GLUCOPHAGE-XR) 750 MG 24 hr tablet Take 1 tablet (750 mg total) by mouth  2 (two) times daily. 60 tablet 11  . metoprolol succinate (TOPROL-XL) 50 MG 24 hr tablet TAKE 1 TABLET ONCE A DAY (Patient taking differently: TAKE 1 TABLET BY MOUTH ONCE A DAY) 90 tablet 2  . Omega-3 Fatty Acids (FISH OIL) 1000 MG CAPS Take 2,000 mg by mouth daily.     Marland Kitchen omeprazole (PRILOSEC) 40 MG capsule Take 40 mg by mouth daily.      . pravastatin (PRAVACHOL) 40 MG tablet Take 40 mg by mouth daily.      . traZODone (DESYREL) 100 MG tablet TAKE 2 TABLETS BY MOUTH AT BEDTIME AS NEEDED (Patient taking differently: TAKE 2 TABLETS BY MOUTH AT BEDTIME AS NEEDED FOR SLEEP) 60 tablet 1  . benzonatate (TESSALON) 100 MG capsule Take 100 mg by mouth every 8 (eight) hours as needed for cough.    Marland Kitchen guaiFENesin-codeine (ROBITUSSIN AC) 100-10 MG/5ML syrup Take 5 mLs by mouth every 6 (six) hours as needed for cough or congestion.      No current facility-administered medications for this visit.    Allergies  Allergen Reactions  . Cephalexin Hives  . Keflin [Cephalothin]     rash  . Lamisil [Terbinafine Hcl]     Rash/itchy  . Latex Dermatitis  . Lisinopril     REACTION: cough  . Zolpidem Tartrate     UNKNOWN    History   Social History  . Marital Status: Married    Spouse Name: N/A    Number of Children: 3  . Years of Education: N/A   Occupational History  . Not on file.   Social History Main Topics  . Smoking status: Former Smoker    Quit date: 07/18/2008  . Smokeless tobacco: Not on file     Comment: No smoking since bypass in 4/10  . Alcohol Use: No       . Drug Use: No  . Sexually Active: Not on file   Other Topics Concern  . Not on file   Social History Narrative       Family History  Problem Relation Age of Onset  . Heart failure Mother   . Diabetes Mother   . Cancer Mother     ovarian?  . Heart disease Mother   . Heart attack Father   . Heart attack Brother   . Diabetes Brother   . Heart attack Brother   . Diabetes Brother   . Heart attack Sister   .  Diabetes Sister   . Cancer Maternal Aunt     died of breast cancer  . Cancer Maternal Grandmother     died of bone cancer  . Congestive Heart Failure Mother     cause of death    Review  of Systems:  As stated in the HPI and otherwise negative.   BP 114/82 mmHg  Pulse 61  Ht 5\' 5"  (1.651 m)  Wt 168 lb (76.204 kg)  BMI 27.96 kg/m2  SpO2 98%  Physical Examination: General: Well developed, well nourished, NAD HEENT: OP clear, mucus membranes moist SKIN: warm, dry. No rashes. Neuro: No focal deficits Musculoskeletal: Muscle strength 5/5 all ext Psychiatric: Mood and affect normal Neck: No JVD, no carotid bruits, no thyromegaly, no lymphadenopathy. Lungs:Clear bilaterally, no wheezes, rhonci, crackles Cardiovascular: Regular rate and rhythm. No murmurs, gallops or rubs. Abdomen:Soft. Bowel sounds present. Non-tender.  Extremities: No lower extremity edema. Pulses are 2 + in the bilateral DP/PT.  Assessment and Plan:   1. CAD: Stable. No symptoms of unstable angina. Will continue current therapy. BP controlled. He is on a statin. Lipids followed in primary care and controlled per pt.   2. Tobacco abuse, in remission: He stopped smoking in 2010   3. HTN: BP controlled. No changes today.

## 2014-07-03 ENCOUNTER — Ambulatory Visit
Admit: 2014-07-03 | Disposition: A | Discharge status: Home / Self Care | Payer: Self-pay | Attending: Internal Medicine | Admitting: Internal Medicine

## 2014-07-03 ENCOUNTER — Ambulatory Visit: Payer: Self-pay | Admitting: Internal Medicine

## 2014-07-24 ENCOUNTER — Ambulatory Visit (INDEPENDENT_AMBULATORY_CARE_PROVIDER_SITE_OTHER): Payer: BLUE CROSS/BLUE SHIELD | Admitting: Internal Medicine

## 2014-07-24 ENCOUNTER — Encounter: Payer: Self-pay | Admitting: Internal Medicine

## 2014-07-24 VITALS — BP 118/80 | HR 59 | Temp 97.9°F | Ht 65.0 in | Wt 168.0 lb

## 2014-07-24 DIAGNOSIS — E119 Type 2 diabetes mellitus without complications: Secondary | ICD-10-CM

## 2014-07-24 DIAGNOSIS — Z125 Encounter for screening for malignant neoplasm of prostate: Secondary | ICD-10-CM

## 2014-07-24 DIAGNOSIS — I2510 Atherosclerotic heart disease of native coronary artery without angina pectoris: Secondary | ICD-10-CM | POA: Diagnosis not present

## 2014-07-24 DIAGNOSIS — Z23 Encounter for immunization: Secondary | ICD-10-CM

## 2014-07-24 DIAGNOSIS — I10 Essential (primary) hypertension: Secondary | ICD-10-CM

## 2014-07-24 DIAGNOSIS — Z0000 Encounter for general adult medical examination without abnormal findings: Secondary | ICD-10-CM | POA: Insufficient documentation

## 2014-07-24 DIAGNOSIS — E785 Hyperlipidemia, unspecified: Secondary | ICD-10-CM

## 2014-07-24 LAB — COMPREHENSIVE METABOLIC PANEL
ALT: 60 U/L — ABNORMAL HIGH (ref 0–53)
AST: 28 U/L (ref 0–37)
Albumin: 4.5 g/dL (ref 3.5–5.2)
Alkaline Phosphatase: 43 U/L (ref 39–117)
BUN: 15 mg/dL (ref 6–23)
CALCIUM: 9.6 mg/dL (ref 8.4–10.5)
CO2: 28 mEq/L (ref 19–32)
CREATININE: 1.29 mg/dL (ref 0.40–1.50)
Chloride: 102 mEq/L (ref 96–112)
GFR: 60.23 mL/min (ref >60.00)
Glucose, Bld: 107 mg/dL — ABNORMAL HIGH (ref 70–99)
POTASSIUM: 4.7 meq/L (ref 3.5–5.1)
Sodium: 137 mEq/L (ref 135–145)
Total Bilirubin: 0.7 mg/dL (ref 0.2–1.2)
Total Protein: 7.1 g/dL (ref 6.0–8.3)

## 2014-07-24 LAB — LIPID PANEL
Cholesterol: 101 mg/dL (ref 0–200)
HDL: 27 mg/dL — ABNORMAL LOW (ref >39.00)
LDL CALC: 43 mg/dL (ref 0–99)
NONHDL: 74
Total CHOL/HDL Ratio: 4
Triglycerides: 156 mg/dL — ABNORMAL HIGH (ref 0.0–149.0)
VLDL: 31.2 mg/dL (ref 0.0–40.0)

## 2014-07-24 LAB — CBC WITH DIFFERENTIAL/PLATELET
Basophils Absolute: 0 10*3/uL (ref 0.0–0.1)
Basophils Relative: 0.7 % (ref 0.0–3.0)
Eosinophils Absolute: 0.3 10*3/uL (ref 0.0–0.7)
Eosinophils Relative: 5.1 % — ABNORMAL HIGH (ref 0.0–5.0)
HCT: 44.4 % (ref 39.0–52.0)
HEMOGLOBIN: 15.4 g/dL (ref 13.0–17.0)
Lymphocytes Relative: 25.6 % (ref 12.0–46.0)
Lymphs Abs: 1.5 10*3/uL (ref 0.7–4.0)
MCHC: 34.6 g/dL (ref 30.0–36.0)
MCV: 91.9 fl (ref 78.0–100.0)
MONOS PCT: 8.1 % (ref 3.0–12.0)
Monocytes Absolute: 0.5 10*3/uL (ref 0.1–1.0)
NEUTROS ABS: 3.5 10*3/uL (ref 1.4–7.7)
Neutrophils Relative %: 60.5 % (ref 43.0–77.0)
Platelets: 136 10*3/uL — ABNORMAL LOW (ref 150.0–400.0)
RBC: 4.83 Mil/uL (ref 4.22–5.81)
RDW: 13.9 % (ref 11.5–15.5)
WBC: 5.8 10*3/uL (ref 4.0–10.5)

## 2014-07-24 LAB — HEMOGLOBIN A1C: HEMOGLOBIN A1C: 5.3 % (ref 4.6–6.5)

## 2014-07-24 LAB — PSA: PSA: 0.66 ng/mL (ref 0.10–4.00)

## 2014-07-24 LAB — T4, FREE: Free T4: 0.79 ng/dL (ref 0.60–1.60)

## 2014-07-24 MED ORDER — ZOSTER VACCINE LIVE 19400 UNT/0.65ML ~~LOC~~ SOLR: 0.6500 mL | Freq: Once | SUBCUTANEOUS | Status: DC

## 2014-07-24 NOTE — Addendum Note (Signed)
Addended by: Despina Hidden on: 07/24/2014 10:32 AM   Modules accepted: Orders

## 2014-07-24 NOTE — Assessment & Plan Note (Signed)
BP Readings from Last 3 Encounters:  07/24/14 118/80  06/10/14 114/82  04/16/14 128/80   Good control

## 2014-07-24 NOTE — Assessment & Plan Note (Signed)
No angina On appropriate regimen 

## 2014-07-24 NOTE — Assessment & Plan Note (Signed)
No problems with statin 

## 2014-07-24 NOTE — Progress Notes (Signed)
Subjective:    Patient ID: Joseph [name], male    DOB: [birth year], 59 y.o.   MRN: [id number]  HPI Here for physical He has no new concerns Did see Dr Blanchard Mane and podiatrist--Dr Regal (got ingrown nails done)  Did see Dr Inez Pilgrim again Lung mass has resolved but with new nodules Plans another follow up CT scan  No heart problems  Is checking sugars every other day 90-120 mostly. Rarely over 120 No hypoglycemic reactions of note No sores in feet. Some pain from plantar fasciitis  Current Outpatient Prescriptions on File Prior to Visit  Medication Sig Dispense Refill  . ALPRAZolam (XANAX) 0.5 MG tablet TAKE 1 TABLET BY MOUTH EVERY 12 HOURS AS NEEDED (Patient taking differently: TAKE 1 TABLET BY MOUTH EVERY 12 HOURS AS NEEDED FOR ANXIETY) 90 tablet 0  . aspirin (ASPIR-81) 81 MG EC tablet Take 81 mg by mouth daily.      Marland Kitchen glucose blood (ONE TOUCH ULTRA TEST) test strip Use 3 (three) times daily.    Marland Kitchen ibuprofen (ADVIL,MOTRIN) 200 MG tablet Take 200 mg by mouth every 6 (six) hours as needed for pain.    Marland Kitchen losartan (COZAAR) 50 MG tablet Take 1 tablet (50 mg total) by mouth daily. 90 tablet 3  . metFORMIN (GLUCOPHAGE-XR) 750 MG 24 hr tablet Take 1 tablet (750 mg total) by mouth 2 (two) times daily. 60 tablet 11  . metoprolol succinate (TOPROL-XL) 50 MG 24 hr tablet TAKE 1 TABLET ONCE A DAY (Patient taking differently: TAKE 1 TABLET BY MOUTH ONCE A DAY) 90 tablet 2  . Omega-3 Fatty Acids (FISH OIL) 1000 MG CAPS Take 2,000 mg by mouth daily.     Marland Kitchen omeprazole (PRILOSEC) 40 MG capsule Take 40 mg by mouth daily.      . pravastatin (PRAVACHOL) 40 MG tablet Take 40 mg by mouth daily.       No current facility-administered medications on file prior to visit.    Allergies  Allergen Reactions  . Cephalexin Hives  . Keflin [Cephalothin]     rash  . Lamisil [Terbinafine Hcl]     Rash/itchy  . Latex Dermatitis  . Lisinopril     REACTION: cough  . Zolpidem Tartrate     UNKNOWN    Past  Medical History  Diagnosis Date  . CAD (coronary artery disease)     s/p emergent 4 V CABG (4/10) w 2/4 patent grafts by cath 08/11/09, patent LIMA to LAD & SVG to OM w moderate disease native RCA & occluded grafts to RCA and diagonal  . 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 Nephrolithiasis   . Normal echocardiogram 5/10    EF 60, apical & apicallateral HK, PASP 62mmHg. Repeat at [facility] w reported ef of 40%.   . Chest pain     secondary to sternal non-union post CABg-now s/p sternotomy w repair 03/28/09 at [facility], dr. Lunette Stands  . HTN (hypertension)   . 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 Arthritis   . History of chicken pox   . Type 2 diabetes mellitus, controlled 6/15  . GERD (gastroesophageal reflux disease)     with Schatzki ring    Past Surgical History  Procedure Laterality Date  . Coronary artery bypass graft  08/16/2008    x4  . Tonsillectomy    . Pilonidal sinus tract excision    . Vasectomy    . Inguinal hernia repair Left     Dr Ernst Spell  .  Sternum separation repair  03/2009    [facility]  . Ileostomy  2014    with reversal-- after diverticulitis. Sigmoid colectomy also. Dr Ely/Bird  . Esophageal dilation  ~2009    Dr Earlean Shawl    Family History  Problem Relation Age of Onset  . Heart failure Mother   . Diabetes Mother   . Cancer Mother     ovarian?  . Heart disease Mother   . Heart attack Father   . Heart attack Brother   . Diabetes Brother   . Heart attack Brother   . Diabetes Brother   . Heart attack Sister   . Diabetes Sister   . Cancer Maternal Aunt     died of breast cancer  . Cancer Maternal Grandmother     died of bone cancer  . Congestive Heart Failure Mother     cause of death    History   Social History  . Marital Status: Married    Spouse Name: N/A  . Number of Children: 3  . Years of Education: N/A   Occupational History  . Broadcaster/advertising sales      Disabled   Social History Main Topics  . Smoking status: Former Smoker    Quit date: 07/18/2008  . Smokeless tobacco: Never Used     Comment: No smoking since bypass in 4/10  . Alcohol Use: No     Comment: He does not drink alcohol  . Drug Use: No  . Sexual Activity: Not on file   Other Topics Concern  . Not on file   Social History Narrative   No living will   Wife is health care POA   Would accept resuscitation   Not sure about tube feeds--probably not prolonged    Review of Systems  Constitutional: Negative for fatigue and unexpected weight change.       Wears seat belt  HENT: Positive for hearing loss. Negative for dental problem and tinnitus.        Overdue for dentist  Eyes: Negative for visual disturbance.       No diplopia or unilateral vision loss  Respiratory: Negative for cough, chest tightness and shortness of breath.   Cardiovascular: Negative for chest pain, palpitations and leg swelling.       Rare "tinch" of pain---only seconds  Walks regularly but not that long  Gastrointestinal: Negative for nausea, vomiting, abdominal pain, constipation and blood in stool.       Heartburn controlled with omeprazole  Endocrine: Negative for polydipsia and polyuria.  Genitourinary: Negative for urgency, frequency and difficulty urinating.       No sig ED  Musculoskeletal: Positive for back pain. Negative for joint swelling and arthralgias.       Has some pain related to past sternal surgery  Skin: Positive for rash.       Mild rosacea--- uses OTC hydrocortisone cream  Allergic/Immunologic: Positive for environmental allergies. Negative for immunocompromised state.       Rare mild runny nose--no meds  Neurological: Positive for headaches. Negative for dizziness, syncope, weakness, light-headedness and numbness.  Hematological: Negative for adenopathy. Does not bruise/bleed easily.  Psychiatric/Behavioral: Positive for dysphoric mood. Negative for sleep disturbance. The  patient is not nervous/anxious.        Sleeps okay with the trazodone and alprazolam (1/2). Occasional down times, from not working, etc--- usually short lived       Objective:   Physical Exam  Constitutional: He is oriented to person, place, and time.  He appears well-developed and well-nourished. No distress.  HENT:  Head: Normocephalic and atraumatic.  Right Ear: External ear normal.  Left Ear: External ear normal.  Mouth/Throat: Oropharynx is clear and moist. No oropharyngeal exudate.  Eyes: Conjunctivae and EOM are normal. Pupils are equal, round, and reactive to light.  Neck: Normal range of motion. Neck supple. No thyromegaly present.  Cardiovascular: Normal rate, regular rhythm, normal heart sounds and intact distal pulses.  Exam reveals no gallop.   No murmur heard. Pulmonary/Chest: Effort normal and breath sounds normal. No respiratory distress. He has no wheezes. He has no rales.  Abdominal: Soft. There is no tenderness.  Musculoskeletal: He exhibits no edema or tenderness.  Lymphadenopathy:    He has no cervical adenopathy.  Neurological: He is alert and oriented to person, place, and time.  Normal sensation in plantar feet  Skin: No rash noted. No erythema.  Psychiatric: He has a normal mood and affect. His behavior is normal.          Assessment & Plan:

## 2014-07-24 NOTE — Progress Notes (Signed)
Pre visit review using our clinic review tool, if applicable. No additional management support is needed unless otherwise documented below in the visit note. 

## 2014-07-24 NOTE — Assessment & Plan Note (Signed)
Seems to have excellent control Will check A1c

## 2014-07-24 NOTE — Assessment & Plan Note (Signed)
Doing well prevnar today Rx for zostavax Will check PSA after discussion  Colonoscopy due 2020 Yearly flu shot Discussed increased exercise

## 2014-07-28 ENCOUNTER — Other Ambulatory Visit: Payer: Self-pay | Admitting: Internal Medicine

## 2014-08-09 NOTE — H&P (Signed)
PATIENT NAME:  Joseph [name], Joseph [name] MR#:  [id number] DATE OF BIRTH:  1955-12-08  DATE OF ADMISSION:  08/06/2012  HISTORY OF PRESENT ILLNESS:  The patient is a 59 year old white male who had a little bit of stomach discomfort this morning and went to work and by time to get off work he was in excruciating pain.  He describes his pain as being more in the right lower quadrant than the left lower quadrant and waxing and waning, but becoming more severe during the day.  He denies fever, but he has had some chills.  He typically has two bowel movements per day, but today he only had one and that was this morning.  He states that it was smaller caliber than usual, but it was not consistent with constipation or small hard stool.  He has never had anything like this before.   PAST MEDICAL HISTORY:  Subendocardial myocardial infarction with LAD stent placement 2003, coronary artery bypass graft x 4 in April of 2010.  Subsequent cardiac catheterization (he thinks in April of 2011) showing a 70% stenosis of something (he is not clear whether this is a coronary artery or a graft).  Anxiety, hypertension, gastroesophageal reflux disease, dyslipidemia.  MEDICATIONS:  Alprazolam 0.25 to 0.5 mg as needed, aspirin 81 mg daily, losartan 50 mg daily, metoprolol 50 mg daily, omeprazole 40 mg daily, pravastatin 40 mg daily, trazodone 100 mg daily, fish oil, and vitamin B12.   ALLERGIES:  AMBIEN (ALTERED MENTAL STATUS).  LAMISIL (RASH).  LISINOPRIL (COUGH).  PERCODAN (UNKNOWN).  REVIEW OF SYSTEMS:  Negative for 10 systems except as mentioned in the history of present illness above.  Specifically, the patient denies chronic constipation, fever, and diarrhea.  He has had some chills.   SOCIAL HISTORY:  The patient is married and lives at home with his wife.  His daughter is also married and lives nearby with her husband.  He smoked about 15 cigarettes per day until his coronary artery bypass graft in April of 2010 and he quit at  that time (four years ago).  He drinks alcohol only very rarely.  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   FAMILY HISTORY:  Significant for multiple male family members with deaths at approximately 61 years old from coronary artery disease and myocardial infarctions.   PHYSICAL EXAMINATION: GENERAL:  Reveals a pleasant middle-aged white male lying comfortably on the stretcher.  He is engaging.  Height 5 feet 10 inches, weight 176 pounds, BMI 25.3.   VITAL SIGNS:  Temperature 96.9, pulse 61, respirations 20, blood pressure 141/80, oxygen saturation 97% on room air at rest.  HEENT:  Pupils equally round and reactive to light.  Extraocular movements intact.  Sclerae are anicteric.  Oropharynx clear.  Mucous membranes moist.  Hearing intact to voice.  NECK:  Supple with no tracheal deviation or jugular venous distention.  HEART:  Regular rate and rhythm with no murmurs or rubs.  LUNGS:  Clear to auscultation with normal respiratory effort bilaterally.  ABDOMEN:  Nondistended, but significantly tender in both lower quadrants with some rebound tenderness and guarding.  The tenderness is slightly worse in the left lower quadrant than the right lower quadrant.  EXTREMITIES:  No edema with normal capillary refill bilaterally.  NEUROLOGIC:  Cranial nerves II through XII, motor and sensation grossly intact.  PSYCHIATRIC:  Alert and oriented x 4.  Appropriate affect.   LABORATORY, DIAGNOSTIC, AND RADIOLOGICAL DATA:  A urinalysis is normal.  Electrolytes normal.  Lipase  and hepatic profile essentially normal, but the SGOT is 66 and SGPT is 216.  White blood cell count 12, hemoglobin 15.6, hematocrit 44%, platelet count 146,000.  CT scan of the abdomen and pelvis with contrast reveals moderately large hiatal hernia, a normal appendix, acute proximal to mid sigmoid colon diverticulitis with small bubbles of air adjacent to, but outside the colon consistent with microperforation.  There is no free  pneumoperitoneum and no fluid present.   ASSESSMENT:  Intermediate severity acute sigmoid diverticulitis with microperforation in a patient with re-vascularized coronary artery disease, but the possibility of a stenosis in either a coronary artery or a graft.   PLAN:  Admit to hospital for IV fluid hydration, clear liquid diet, IV antibiotics, lactulose, and ultimately - if he improves - advancement of his diet to a low fiber or low residue diet.  Had a long discussion with the patient and his wife and daughter regarding the possibility of colectomy and colostomy or colectomy and re-anastomosis with diverting loop ileostomy or single-stage colectomy either in the hospital if he does not improve or electively after discharge if he does.     ____________________________ Consuela Mimes, MD wfm:ea D: 08/05/2012 23:30:56 ET T: 08/06/2012 01:32:53 ET JOB#: [id number]  cc: Consuela Mimes, MD, <Dictator> Consuela Mimes, MD Consuela Mimes MD ELECTRONICALLY SIGNED 08/06/2012 21:39

## 2014-08-09 NOTE — Op Note (Signed)
PATIENT NAME:  Joseph [name], Joseph [name] MR#:  [id number] DATE OF BIRTH:  [birth year]  DATE OF PROCEDURE:  10/11/2012  PREOPERATIVE DIAGNOSIS:  Perforated sigmoid diverticulitis with desire for ileostomy reversal.   POSTOPERATIVE DIAGNOSIS:  Perforated sigmoid diverticulitis with desire for ileostomy reversal.   PROCEDURE PERFORMED: Ileostomy takedown. Small bowel resection with primary anastomosis.   SURGEON:  Sherri Rad, M.D. FACS  ASSISTANT:  Dr. Genevive Bi, MD , FACS  TYPE OF ANESTHESIA:  General endotracheal.   FINDINGS:  Normal-appearing ileostomy and minimal adhesions.   SPECIMENS:  Portion of small bowel to pathology.   ESTIMATED BLOOD LOSS:  25 mL.   DRAINS:  None.   LAP AND NEEDLE COUNT:  Correct  x 2.   DESCRIPTION OF PROCEDURE:  Informed consent, supine position, general oral endotracheal anesthesia was induced. Foley catheter was placed. The abdomen was clipped of hair. Ileostomy bag was removed. Abdomen was widely prepped and draped with Betadine solution. Perioperative antibiotics and DVT prophylaxis being administered.   Timeout was observed.   A transversely oriented elliptical incision encompassing the mucocutaneous junction of the bowel with the skin was fashioned with a scalpel and carried down to the fascia with electrocautery circumferentially releasing the bowel from subcutaneous attachments and from the fascia. Attachments were minimal. The undersurface of the fascia and the peritoneum appeared to be free of any adhesions. The unusable portion of the ileum was transected on either side with separate fires of the GIA 75 stapler. The intervening mesentery was then sequentially scored, clamped, cut and tied with 0 Vicryl suture and 3-0 silk suture ligature. A side-to-side functional end-to-end anastomosis was then created by aligning the 2 limbs with seromuscular 3-0 silk suture. Enterotomy was fashioned with electrocautery. Each limb of the 75 stapler was then inserted, common channel  enterotomy was then created. The enterotomy at the end was closed with a single fire of the GIA 75 stapler. Anti-traction stitch was placed of 3-0 silk. The staple line was then imbricated with seromuscular 3-0 silk suture. Mesenteric defect was obliterated with several figure-of-eight 3-0 silk sutures. The anastomosis appeared to be in good order, pink and viable. It was then reduced back into the abdomen. The peritoneum was closed with a running #0 Vicryl suture. The anterior fascia was then released from the existing hernia sac and subcutaneous tissues with electrocautery and closed from the extremes transversely with #1 running PDS. Subcutaneous tissues were then irrigated, reapproximated with a #1 Vicryl in interrupted fashion. Skin edges were reapproximated utilizing a stapler. Sterile occlusive dressing was placed. The patient was then subsequently extubated and taken to the recovery room in stable and satisfactory condition by anesthesia services.      ____________________________ Jeannette How Marina Gravel, MD FACS mab:dmm D: 10/11/2012 09:39:33 ET T: 10/11/2012 09:57:44 ET JOB#: [id number]  cc: Elta Guadeloupe A. Marina Gravel, MD, <Dictator> cc:  Dr. Juluis Pitch, MD, [facility]. Thersia Petraglia A Rubin Dais MD ELECTRONICALLY SIGNED 10/11/2012 12:13

## 2014-08-09 NOTE — Discharge Summary (Signed)
PATIENT NAME:  [name], Joseph MR#:  [id number] DATE OF BIRTH:  [birth year]  DATE OF ADMISSION:  10/11/2012 DATE OF DISCHARGE:  10/15/2012  FINAL DIAGNOSIS: Sigmoid diverticulitis with desire for ileostomy reversal.   HOSPITAL COURSE SUMMARY: The patient was admitted following an uneventful closure of his ileostomy performed on 06/25. Postoperatively, he had an uneventful postoperative course. On postoperative day #1, he was doing well. On postoperative day #2, he had already had 2 bowel movements. He was hemodynamically stable. His wound was healing nicely. On postoperative day #3, he was given a trial of Toradol. On postoperative day #4, he was tolerating a regular diet. His wound was healing nicely. He showered. No cellulitis or drainage was noted and was discharged home in stable condition with followup the following week in [place].   DISCHARGE MEDICATIONS: Omeprazole 40 mg by mouth once a day, trazodone 100 mg by mouth once a day at bedtime, fish oil 1000 mg by mouth once in the morning, vitamin B12 once a day, ibuprofen 200 mg by mouth as needed, Percocet 5/325 mg 1 to 2 tabs every 4 to 6 hours as needed by mouth.  ____________________________ Jeannette How. Marina Gravel, MD mab:aw D: 10/27/2012 02:24:01 ET T: 10/27/2012 06:40:16 ET JOB#: [id number]  cc: Elta Guadeloupe A. Marina Gravel, MD, <Dictator> Youlanda Roys. Lovie Macadamia, MD Hortencia Conradi MD ELECTRONICALLY SIGNED 10/29/2012 21:58

## 2014-08-09 NOTE — Discharge Summary (Signed)
PATIENT NAME:  Joseph [name], Joseph [name] MR#:  [id number] DATE OF BIRTH:  [birth year]  DATE OF ADMISSION:  08/06/2012 DATE OF DISCHARGE:  08/13/2012  FINAL DIAGNOSIS: Acute perforated sigmoid diverticulitis.   PRINCIPAL PROCEDURES:  1. CT scan of the abdomen and pelvis.  2. Exploratory laparotomy, sigmoid colectomy with low anterior resection, diverting loop ileostomy and incidental appendectomy on 08/06/2012.   HOSPITAL COURSE SUMMARY: The patient was admitted with significant abdominal pain and perforated sigmoid diverticulitis which appeared to be a contained microperforation. On hospital day #1, the patient was examined several times and despite pain medications, hydration, intravenous antibiotics and bowel rest, in my opinion he had an acute abdomen. The patient was, therefore, taken to the operating room on the evening of the 20th of April, at which point sigmoid colectomy with end-to-end anastomosis and diverting loop ileostomy was performed by Drs. Marina Gravel and [employer]. Postoperatively, the patient had an unremarkable postoperative stay. He did require some extra IV fluids on postoperative day #1 for tachycardia. His ostomy remained viable. Pain control was a big issue which was controlled on PCA morphine. On postoperative day #2, the patient had some minimal ostomy function. His abdomen was distended with some quiet bowel sounds. On postoperative day #3, the patient continued to improve. He had some gas in his ostomy. He was tolerating liquids without any difficulty. Pain was well controlled. On the 24th of April, the patient continued to improve. His diet was able to be increased and his IV fluids decreased. On the 25th of April, the patient was restarted on his oral pain medications, switched to oral antibiotics and a soft diet. His ostomy seemed to be functioning nicely. On the 26th, the patient continued to improve. Home health was assigned for assistance with ostomy care. Continuing regular diet and  tolerating this well. On the 27th, the patient was deemed suitable for discharge.   DISCHARGE MEDICATIONS: Clindamycin 300 mg by mouth every 8 hours, metoprolol 50 mg by mouth extended release once a day, cephalexin 500 mg by mouth every 8 hours, alprazolam 0.25 mg by mouth every 8 hours as needed for anxiety, aspirin 81 mg by mouth once a day, pravastatin 40 mg by mouth once a day at bedtime, acetaminophen/hydrocodone 5/325 tab 1 tab p.o. every 4 hours as needed for pain, fish oil 1 cap a day, alprazolam 0.25 mg by mouth at bedtime, trazodone 100 mg by mouth daily, losartan 50 mg by mouth daily, omeprazole 40 mg by mouth daily.   He will follow up in our office in 1 to 2 weeks. Call with any questions or concerns, increased ostomy output, inability to take p.o., nausea, vomiting or drainage from his wound or fever.   ____________________________ Jeannette How Marina Gravel, MD FACS mab:gb D: 08/15/2012 21:53:10 ET T: 08/15/2012 23:07:45 ET JOB#: [id number]  cc: Elta Guadeloupe A. Marina Gravel, MD, <Dictator> Hortencia Conradi MD ELECTRONICALLY SIGNED 08/16/2012 20:36

## 2014-08-09 NOTE — Op Note (Signed)
PATIENT NAME:  Joseph [name], Joseph [name] MR#:  [id number] DATE OF BIRTH:  May 09, 1955  DATE OF PROCEDURE:  08/06/2012  PREOPERATIVE DIAGNOSIS: Peritonitis with acute perforated sigmoid diverticulitis.  POSTOPERATIVE DIAGNOSIS: Peritonitis with acute perforated sigmoid diverticulitis.  PROCEDURES PERFORMED:  1.  Exploratory laparotomy.  2.  Sigmoid colectomy with low anterior resection stapled anastomosis with 29 mm ILS.  3.  Diverting loop ileostomy.  4.  Incidental appendectomy.   SURGEON: Sherri Rad, MD  ASSISTANT: Molly Maduro, MD  ANESTHESIA:  General endotracheal.   ESTIMATED BLOOD LOSS: 75 mL.   DRAINS: None.   COUNTS: Lap and needle count correct x2.   FINDINGS: Acute sigmoid diverticulitis with perforation, minimal peritoneal contamination.   SPECIMENS: Colon to pathology.  Appendix to pathology.   DESCRIPTION OF PROCEDURE: With informed consent, supine position and general endotracheal anesthesia, the patient's abdomen was widely prepped and draped with ChloraPrep solution after sterile clipping. Timeout was observed. A lower midline incision was fashioned from the pubic symphysis to just above the umbilicus with scalpel and electrocautery through musculofascial layers, and a self-retaining abdominal wall retractor was placed. There was a small amount of cloudy fluid within the abdomen.  The colon was taken down off the white line of Toldt and then specifically, from the previous laparoscopic hernia repair with Prolene, was densely adherent to the mesh. In fact, small portions of the mesh were included in the specimen. The area of perforation was identified. There was a scant amount of fibrinopurulent exudate on it. The colon was divided distal to this with a fire of the contoured 40 stapler with green load application. The mesentery was sequentially scored, clamped, cut and tied with application of the LigaSure apparatus. Proximally, the colon was divided with a second fire of the  contoured 40 stapler. The splenic flexure was then delivered along the white line of Toldt. Specimen was handed off the field.  An additional 4 to 5 cm of rectosigmoid colon was excised with a third application of the contoured stapler with blue load application distally. The intervening mesentery was divided. The specimen was handed off the field.   An end-to-end anastomosis with a 29 mm ILS stapler was achieved.  The proximal staple line was excised. A 29 mm anvil was inserted in a [employer] fashion after 1 mg of intravenous glucagon given by anesthesia. The colotomy was then closed with a fire of the GIA 75 stapler. We then inserted, with the patient in frogleg position, the dilators followed by the stapler. The spike on the stapler was extruded through the distal staple line.  Two thick and intact staple rings were achieved.  Anastomosis appeared to be airtight.   It lay without any undue tension. The abdomen was then irrigated with approximately 1 liter of warm normal saline and aspirated dry.  Incidental appendectomy was performed by division of the mesoappendix with the LigaSure apparatus and transection of the base of the appendix with a GIA 75 stapler. A diverting loop ileostomy was chosen approximately 20 cm proximal to the ileocecal valve. An 18-French red rubber Robinson catheter was then placed just underneath the bowel wall. A wheal of skin was excised in the right lower quadrant. A cruciate incision was made in the fascia.  A muscle splitting technique was used.  The ileostomy was brought through this site. The red rubber catheter was used as a bridge.    With lap and needle count correct x2, the abdominal fascia was closed with running #1 PDS suture. Standard  Brooke ileostomy was performed with 4-0 Monocryl suture. Sterile dressings were applied. The patient was then subsequently extubated and taken to the recovery room in stable and satisfactory condition by anesthesia services.     ____________________________ Jeannette How Marina Gravel, MD FACS mab:sb D: 08/07/2012 10:56:13 ET T: 08/07/2012 11:20:54 ET JOB#: [id number]  cc: Elta Guadeloupe A. Marina Gravel, MD, <Dictator> Hortencia Conradi MD ELECTRONICALLY SIGNED 08/11/2012 19:48

## 2014-08-20 ENCOUNTER — Telehealth: Payer: Self-pay | Admitting: Internal Medicine

## 2014-08-20 NOTE — Telephone Encounter (Signed)
Patient received several reminders on mychart to schedule an appointment to get the Pneumococcal vaccine.  Patient had it done on 07/24/14. Patient,also,received a reminder to get the Zostavax.  He is going to the pharmacy to have it done and will call to notify you when he has it done.

## 2014-08-20 NOTE — Telephone Encounter (Signed)
Pt called stating he received a notice through my chart saying he was over due for urine protein check   He wanted to know if he needed to make appointment here with dr Silvio Pate or dr Inez Pilgrim [facility] He wanted to know if this was for the creatine

## 2014-08-20 NOTE — Telephone Encounter (Signed)
Sent pt message back thru my-chart.

## 2014-08-20 NOTE — Telephone Encounter (Signed)
This is an error since he is on losartan (an ARB) He does not need urine protein testing   Adrienne, The number of incorrect MyChart notifications is disturbing

## 2014-09-03 ENCOUNTER — Encounter: Payer: Self-pay | Admitting: Internal Medicine

## 2014-09-03 ENCOUNTER — Ambulatory Visit (INDEPENDENT_AMBULATORY_CARE_PROVIDER_SITE_OTHER): Payer: BLUE CROSS/BLUE SHIELD | Admitting: Internal Medicine

## 2014-09-03 VITALS — BP 110/78 | HR 65 | Temp 97.4°F | Wt 168.0 lb

## 2014-09-03 DIAGNOSIS — H1031 Unspecified acute conjunctivitis, right eye: Secondary | ICD-10-CM

## 2014-09-03 MED ORDER — SULFACETAMIDE SODIUM 10 % OP SOLN: 1.0000 [drp] | Freq: Four times a day (QID) | OPHTHALMIC | Status: DC

## 2014-09-03 NOTE — Assessment & Plan Note (Signed)
No trauma, allergies, foreign body or viral infection evident Will try empiric sulfa drops

## 2014-09-03 NOTE — Progress Notes (Signed)
Pre visit review using our clinic review tool, if applicable. No additional management support is needed unless otherwise documented below in the visit note. 

## 2014-09-03 NOTE — Progress Notes (Signed)
Subjective:    Patient ID: Joseph [name], male    DOB: [birth year], 59 y.o.   MRN: [id number]  HPI Having pain in his right eye Started with irritation--like something in there--- 5 days ago Thought there may be a stye so tried hot compresses  Tried some OTC eye drops Some watery discharge No crusting  No recent cold but some nasal drip Slight cough No fever  Current Outpatient Prescriptions on File Prior to Visit  Medication Sig Dispense Refill  . aspirin (ASPIR-81) 81 MG EC tablet Take 81 mg by mouth daily.      Marland Kitchen glucose blood (ONE TOUCH ULTRA TEST) test strip Use 3 (three) times daily.    Marland Kitchen ibuprofen (ADVIL,MOTRIN) 200 MG tablet Take 200 mg by mouth every 6 (six) hours as needed for pain.    Marland Kitchen losartan (COZAAR) 50 MG tablet Take 1 tablet (50 mg total) by mouth daily. 90 tablet 3  . metFORMIN (GLUCOPHAGE-XR) 750 MG 24 hr tablet Take 1 tablet (750 mg total) by mouth 2 (two) times daily. 60 tablet 11  . Omega-3 Fatty Acids (FISH OIL) 1000 MG CAPS Take 2,000 mg by mouth daily.     Marland Kitchen omeprazole (PRILOSEC) 40 MG capsule Take 40 mg by mouth daily.      . pravastatin (PRAVACHOL) 40 MG tablet Take 40 mg by mouth daily.      . traZODone (DESYREL) 100 MG tablet TAKE 2 TABLETS BY MOUTH AT BEDTIME AS NEEDED 60 tablet 1   No current facility-administered medications on file prior to visit.    Allergies  Allergen Reactions  . Cephalexin Hives  . Keflin [Cephalothin]     rash  . Lamisil [Terbinafine Hcl]     Rash/itchy  . Latex Dermatitis  . Lisinopril     REACTION: cough  . Zolpidem Tartrate     UNKNOWN    Past Medical History  Diagnosis Date  . CAD (coronary artery disease)     s/p emergent 4 V CABG (4/10) w 2/4 patent grafts by cath 08/11/09, patent LIMA to LAD & SVG to OM w moderate disease native RCA & occluded grafts to RCA and diagonal  . 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 Nephrolithiasis   . Normal echocardiogram 5/10    EF 60,  apical & apicallateral HK, PASP 33mmHg. Repeat at [facility] w reported ef of 40%.   . Chest pain     secondary to sternal non-union post CABg-now s/p sternotomy w repair 03/28/09 at [facility], dr. Lunette Stands  . HTN (hypertension)   . 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 Arthritis   . History of chicken pox   . Type 2 diabetes mellitus, controlled 6/15  . GERD (gastroesophageal reflux disease)     with Schatzki ring    Past Surgical History  Procedure Laterality Date  . Coronary artery bypass graft  08/16/2008    x4  . Tonsillectomy    . Pilonidal sinus tract excision    . Vasectomy    . Inguinal hernia repair Left     Dr Ernst Spell  . Sternum separation repair  03/2009    [facility]  . Ileostomy  2014    with reversal-- after diverticulitis. Sigmoid colectomy also. Dr Ely/Bird  . Esophageal dilation  ~2009    Dr Earlean Shawl    Family History  Problem Relation Age of Onset  . Heart failure Mother   . Diabetes Mother   . Cancer Mother  ovarian?  . Heart disease Mother   . Congestive Heart Failure Mother     cause of death  . Heart attack Father   . Heart attack Brother   . Diabetes Brother   . Heart attack Brother   . Diabetes Brother   . Heart attack Sister   . Diabetes Sister   . Cancer Maternal Aunt     died of breast cancer  . Cancer Maternal Grandmother     died of bone cancer    History   Social History  . Marital Status: Married    Spouse Name: N/A  . Number of Children: 3  . Years of Education: N/A   Occupational History  . Broadcaster/advertising sales     Disabled   Social History Main Topics  . Smoking status: Former Smoker    Quit date: 07/18/2008  . Smokeless tobacco: Never Used     Comment: No smoking since bypass in 4/10  . Alcohol Use: No     Comment: He does not drink alcohol  . Drug Use: No  . Sexual Activity: Not on file   Other Topics Concern  . Not on file   Social History Narrative   No living will    Wife is health care POA   Would accept resuscitation   Not sure about tube feeds--probably not prolonged   Review of Systems No vision changes No trauma to eye    Objective:   Physical Exam  Constitutional: He appears well-developed and well-nourished. No distress.  Eyes:  Injection of right conjunctiva--bulbar and tarsal No foreign body No drainage          Assessment & Plan:

## 2014-09-26 ENCOUNTER — Telehealth: Payer: Self-pay | Admitting: *Deleted

## 2014-09-26 NOTE — Telephone Encounter (Signed)
Pt called earlier stating that he is still in the process of deciding which physician he would like to see. Also, he would like to proceed with his CT scan now that his insurance is straightened out... I left a message informing him that he would need to see a physician before a CT scan could be ordered; and to call with any questions or concerns.Joseph KitchenMarland [name]

## 2014-09-28 ENCOUNTER — Other Ambulatory Visit: Payer: Self-pay | Admitting: Internal Medicine

## 2014-11-04 ENCOUNTER — Inpatient Hospital Stay: Payer: BLUE CROSS/BLUE SHIELD | Attending: Oncology | Admitting: Oncology

## 2014-11-04 ENCOUNTER — Ambulatory Visit: Payer: BLUE CROSS/BLUE SHIELD | Admitting: Internal Medicine

## 2014-11-04 ENCOUNTER — Other Ambulatory Visit: Payer: Self-pay | Admitting: *Deleted

## 2014-11-04 VITALS — BP 127/90 | HR 69 | Temp 97.0°F | Resp 20 | Wt 168.0 lb

## 2014-11-04 DIAGNOSIS — R911 Solitary pulmonary nodule: Secondary | ICD-10-CM | POA: Diagnosis present

## 2014-11-04 DIAGNOSIS — I2510 Atherosclerotic heart disease of native coronary artery without angina pectoris: Secondary | ICD-10-CM

## 2014-11-04 DIAGNOSIS — Z87891 Personal history of nicotine dependence: Secondary | ICD-10-CM | POA: Diagnosis not present

## 2014-11-04 DIAGNOSIS — Z7982 Long term (current) use of aspirin: Secondary | ICD-10-CM | POA: Insufficient documentation

## 2014-11-04 DIAGNOSIS — Z79899 Other long term (current) drug therapy: Secondary | ICD-10-CM | POA: Diagnosis not present

## 2014-11-04 DIAGNOSIS — I10 Essential (primary) hypertension: Secondary | ICD-10-CM | POA: Insufficient documentation

## 2014-11-04 DIAGNOSIS — Z951 Presence of aortocoronary bypass graft: Secondary | ICD-10-CM | POA: Diagnosis not present

## 2014-11-04 MED ORDER — PRAVASTATIN SODIUM 40 MG PO TABS: 40.0000 mg | ORAL_TABLET | Freq: Every day | ORAL | Status: DC

## 2014-11-04 MED ORDER — OMEPRAZOLE 40 MG PO CPDR: 40.0000 mg | DELAYED_RELEASE_CAPSULE | Freq: Every day | ORAL | Status: DC

## 2014-11-11 ENCOUNTER — Other Ambulatory Visit: Payer: Self-pay | Admitting: Internal Medicine

## 2014-11-11 NOTE — Telephone Encounter (Signed)
rx called into pharmacy

## 2014-11-11 NOTE — Telephone Encounter (Signed)
LETVAK PATIENT, Please send back to me for call in Last filled 05/2014

## 2014-11-12 ENCOUNTER — Ambulatory Visit
Admission: RE | Admit: 2014-11-12 | Discharge: 2014-11-12 | Disposition: A | Discharge status: Home / Self Care | Payer: BLUE CROSS/BLUE SHIELD | Source: Ambulatory Visit | Attending: Oncology | Admitting: Oncology

## 2014-11-12 DIAGNOSIS — K449 Diaphragmatic hernia without obstruction or gangrene: Secondary | ICD-10-CM | POA: Diagnosis not present

## 2014-11-12 DIAGNOSIS — R911 Solitary pulmonary nodule: Secondary | ICD-10-CM | POA: Insufficient documentation

## 2014-11-12 DIAGNOSIS — J984 Other disorders of lung: Secondary | ICD-10-CM | POA: Diagnosis not present

## 2014-11-14 ENCOUNTER — Ambulatory Visit: Payer: BLUE CROSS/BLUE SHIELD

## 2014-11-14 ENCOUNTER — Inpatient Hospital Stay (HOSPITAL_BASED_OUTPATIENT_CLINIC_OR_DEPARTMENT_OTHER): Payer: BLUE CROSS/BLUE SHIELD | Admitting: Oncology

## 2014-11-14 VITALS — BP 130/90 | HR 65 | Temp 96.8°F | Resp 18 | Wt 167.1 lb

## 2014-11-14 DIAGNOSIS — I10 Essential (primary) hypertension: Secondary | ICD-10-CM | POA: Diagnosis not present

## 2014-11-14 DIAGNOSIS — I2510 Atherosclerotic heart disease of native coronary artery without angina pectoris: Secondary | ICD-10-CM

## 2014-11-14 DIAGNOSIS — Z7982 Long term (current) use of aspirin: Secondary | ICD-10-CM

## 2014-11-14 DIAGNOSIS — Z87891 Personal history of nicotine dependence: Secondary | ICD-10-CM

## 2014-11-14 DIAGNOSIS — R911 Solitary pulmonary nodule: Secondary | ICD-10-CM

## 2014-11-14 DIAGNOSIS — Z79899 Other long term (current) drug therapy: Secondary | ICD-10-CM

## 2014-11-14 DIAGNOSIS — Z951 Presence of aortocoronary bypass graft: Secondary | ICD-10-CM

## 2014-11-16 NOTE — Progress Notes (Signed)
[name]  [phone] Fax:(336) [phone]  ID: Joseph [name] OB: Jan 27, 1956  MR#: [id number]  GTX#:[id number]  Patient Care Team: Venia Carbon, MD as PCP - General (Internal Medicine)  CHIEF COMPLAINT:  Chief Complaint  Patient presents with  . Follow-up    ct results    INTERVAL HISTORY: Patient returns to clinic today to discuss his imaging results. He continues to feel well and is asymptomatic. He denies any recent fevers. He has no neurologic complaints. He denies any chest pain, shortness of breath, or cough. He has a good appetite and denies weight loss. He denies any nausea, vomiting, constipation, or diarrhea. He has no urinary complaints. Patient feels at his baseline and offers no specific complaints today.  REVIEW OF SYSTEMS:   Review of Systems  Constitutional: Negative.   Respiratory: Negative.   Cardiovascular: Negative.     As per HPI. Otherwise, a complete review of systems is negatve.  PAST MEDICAL HISTORY: Past Medical History  Diagnosis Date  . CAD (coronary artery disease)     s/p emergent 4 V CABG (4/10) w 2/4 patent grafts by cath 08/11/09, patent LIMA to LAD & SVG to OM w moderate disease native RCA & occluded grafts to RCA and diagonal  . 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 Nephrolithiasis   . Normal echocardiogram 5/10    EF 60, apical & apicallateral HK, PASP 18mmHg. Repeat at [facility] w reported ef of 40%.   . Chest pain     secondary to sternal non-union post CABg-now s/p sternotomy w repair 03/28/09 at [facility], dr. Lunette Stands  . HTN (hypertension)   . 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 Arthritis   . History of chicken pox   . Type 2 diabetes mellitus, controlled 6/15  . GERD (gastroesophageal reflux disease)     with Schatzki ring    PAST SURGICAL HISTORY: Past Surgical History  Procedure Laterality Date  . Coronary artery bypass  graft  08/16/2008    x4  . Tonsillectomy    . Pilonidal sinus tract excision    . Vasectomy    . Inguinal hernia repair Left     Dr Ernst Spell  . Sternum separation repair  03/2009    [facility]  . Ileostomy  2014    with reversal-- after diverticulitis. Sigmoid colectomy also. Dr Ely/Bird  . Esophageal dilation  ~2009    Dr Earlean Shawl    FAMILY HISTORY Family History  Problem Relation Age of Onset  . Heart failure Mother   . Diabetes Mother   . Cancer Mother     ovarian?  . Heart disease Mother   . Congestive Heart Failure Mother     cause of death  . Heart attack Father   . Heart attack Brother   . Diabetes Brother   . Heart attack Brother   . Diabetes Brother   . Heart attack Sister   . Diabetes Sister   . Cancer Maternal Aunt     died of breast cancer  . Cancer Maternal Grandmother     died of bone cancer       ADVANCED DIRECTIVES:    HEALTH MAINTENANCE: History  Substance Use Topics  . Smoking status: Former Smoker    Quit date: 07/18/2008  . Smokeless tobacco: Never Used     Comment: No smoking since bypass in 4/10  . Alcohol Use: No     Comment: He does not drink alcohol  Colonoscopy:  PAP:  Bone density:  Lipid panel:  Allergies  Allergen Reactions  . Cephalexin Hives  . Keflin [Cephalothin]     rash  . Lamisil [Terbinafine Hcl]     Rash/itchy  . Latex Dermatitis  . Lisinopril     REACTION: cough  . Zolpidem Tartrate     UNKNOWN    Current Outpatient Prescriptions  Medication Sig Dispense Refill  . ALPRAZolam (XANAX) 0.5 MG tablet Take 1 tablet (0.5 mg total) by mouth every 12 (twelve) hours as needed for anxiety. 90 tablet 0  . aspirin (ASPIR-81) 81 MG EC tablet Take 81 mg by mouth daily.      Marland Kitchen ibuprofen (ADVIL,MOTRIN) 200 MG tablet Take 200 mg by mouth every 6 (six) hours as needed for pain.    Marland Kitchen losartan (COZAAR) 50 MG tablet Take 1 tablet (50 mg total) by mouth daily. 90 tablet 3  . metFORMIN (GLUCOPHAGE-XR) 750 MG 24 hr tablet Take 1  tablet (750 mg total) by mouth 2 (two) times daily. 60 tablet 11  . metoprolol succinate (TOPROL-XL) 50 MG 24 hr tablet Take 50 mg by mouth daily. Take with or immediately following a meal.    . Omega-3 Fatty Acids (FISH OIL) 1000 MG CAPS Take 2,000 mg by mouth daily.     Marland Kitchen omeprazole (PRILOSEC) 40 MG capsule Take 1 capsule (40 mg total) by mouth daily. 90 capsule 3  . pravastatin (PRAVACHOL) 40 MG tablet Take 1 tablet (40 mg total) by mouth daily. 90 tablet 3  . sulfacetamide (BLEPH-10) 10 % ophthalmic solution Place 1 drop into both eyes 4 (four) times daily. 15 mL 0  . traZODone (DESYREL) 100 MG tablet TAKE 2 TABLETS BY MOUTH AT BEDTIME AS NEEDED 60 tablet 1   No current facility-administered medications for this visit.    OBJECTIVE: Filed Vitals:   11/14/14 1109  BP: 130/90  Pulse: 65  Temp: 96.8 F (36 C)  Resp: 18     Body mass index is 27.81 kg/(m^2).    ECOG FS:0 - Asymptomatic  General: Well-developed, well-nourished, no acute distress. Eyes: Pink conjunctiva, anicteric sclera. Lungs: Clear to auscultation bilaterally. Heart: Regular rate and rhythm. No rubs, murmurs, or gallops. Abdomen: Soft, nontender, nondistended. No organomegaly noted, normoactive bowel sounds. Musculoskeletal: No edema, cyanosis, or clubbing. Neuro: Alert, answering all questions appropriately. Cranial nerves grossly intact. Skin: No rashes or petechiae noted. Psych: Normal affect.    LAB RESULTS:  Lab Results  Component Value Date   NA 137 07/24/2014   K 4.7 07/24/2014   CL 102 07/24/2014   CO2 28 07/24/2014   GLUCOSE 107* 07/24/2014   BUN 15 07/24/2014   CREATININE 1.29 07/24/2014   CALCIUM 9.6 07/24/2014   PROT 7.1 07/24/2014   ALBUMIN 4.5 07/24/2014   AST 28 07/24/2014   ALT 60* 07/24/2014   ALKPHOS 43 07/24/2014   BILITOT 0.7 07/24/2014   GFRNONAA 53* 01/07/2014   GFRAA >60 01/07/2014    Lab Results  Component Value Date   WBC 5.8 07/24/2014   NEUTROABS 3.5 07/24/2014    HGB 15.4 07/24/2014   HCT 44.4 07/24/2014   MCV 91.9 07/24/2014   PLT 136.0* 07/24/2014     STUDIES: Ct Chest Wo Contrast  11/12/2014   CLINICAL DATA:  Right upper lobe pulmonary nodule.  EXAM: CT CHEST WITHOUT CONTRAST  TECHNIQUE: Multidetector CT imaging of the chest was performed following the standard protocol without IV contrast.  COMPARISON:  07/03/2014  FINDINGS: Mediastinum/Nodes: Prior CABG.  Coronary artery stent. Atherosclerosis noted.  Small type 1 hiatal hernia.  No pathologic thoracic adenopathy.  Lungs/Pleura: Mild paraseptal emphysema most notable of the lung apices.  The prior right upper lobe pulmonary nodule has resolved.  Mild scarring posteriorly in the right upper lobe.  No new nodule identified.  Upper abdomen: Left renal upper pole cyst or upper pole moiety hydronephrosis noted with upper pole parenchymal atrophy. This is similar prior.  Musculoskeletal: Unremarkable  IMPRESSION: 1. The prior 6 mm right upper lobe pulmonary nodule has resolved. Accordingly, it was probably inflammatory. No further follow up required. 2. Mild scarring posteriorly in the right upper lobe. 3. Scarring of the left kidney upper pole with associated hydronephrosis or cyst. 4. Type 1 hiatal hernia.   Electronically Signed   By: Van Clines M.D.   On: 11/12/2014 08:31    ASSESSMENT: Pulmonary nodule.  PLAN:    1. Pulmonary nodule: Resolved. CT scan results reviewed independently and reported as above. No further intervention is needed at this time. Given patient's heavy tobacco history, would recommend yearly screening CT scans as ordered by his primary care physician. No follow-up is necessary in the [place].  Patient expressed understanding and was in agreement with this plan.  Lloyd Huger, MD   11/16/2014 3:52 PM

## 2014-11-16 NOTE — Progress Notes (Signed)
[place]  [phone] [phone]) [phone]  ID: Joseph [name] OB: [birth year]  MR#: [id number]  FXT#:[id number]  Patient Care Team: Venia Carbon, MD as PCP - General (Internal Medicine)  CHIEF COMPLAINT:  Chief Complaint  Patient presents with  . Follow-up    INTERVAL HISTORY: Patient returns to clinic today for routine follow-up and discussion of his pulmonary nodule. He currently feels well and is asymptomatic. He denies any recent fevers. He has no neurologic complaints. He denies any chest pain, shortness of breath, or cough. He has a good appetite and denies weight loss. He denies any nausea, vomiting, constipation, or diarrhea. He has no urinary complaints. Patient feels at his baseline and offers no specific complaints today.  REVIEW OF SYSTEMS:   Review of Systems  Constitutional: Negative.   Respiratory: Negative.   Cardiovascular: Negative.     As per HPI. Otherwise, a complete review of systems is negatve.  PAST MEDICAL HISTORY: Past Medical History  Diagnosis Date  . CAD (coronary artery disease)     s/p emergent 4 V CABG (4/10) w 2/4 patent grafts by cath 08/11/09, patent LIMA to LAD & SVG to OM w moderate disease native RCA & occluded grafts to RCA and diagonal  . 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 Nephrolithiasis   . Normal echocardiogram 5/10    EF 60, apical & apicallateral HK, PASP 79mmHg. Repeat at [facility] w reported ef of 40%.   . Chest pain     secondary to sternal non-union post CABg-now s/p sternotomy w repair 03/28/09 at [facility], dr. Lunette Stands  . HTN (hypertension)   . 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 Arthritis   . History of chicken pox   . Type 2 diabetes mellitus, controlled 6/15  . GERD (gastroesophageal reflux disease)     with Schatzki ring    PAST SURGICAL HISTORY: Past Surgical History  Procedure Laterality Date  . Coronary  artery bypass graft  08/16/2008    x4  . Tonsillectomy    . Pilonidal sinus tract excision    . Vasectomy    . Inguinal hernia repair Left     Dr Ernst Spell  . Sternum separation repair  03/2009    [facility]  . Ileostomy  2014    with reversal-- after diverticulitis. Sigmoid colectomy also. Dr Ely/Bird  . Esophageal dilation  ~2009    Dr Earlean Shawl    FAMILY HISTORY Family History  Problem Relation Age of Onset  . Heart failure Mother   . Diabetes Mother   . Cancer Mother     ovarian?  . Heart disease Mother   . Congestive Heart Failure Mother     cause of death  . Heart attack Father   . Heart attack Brother   . Diabetes Brother   . Heart attack Brother   . Diabetes Brother   . Heart attack Sister   . Diabetes Sister   . Cancer Maternal Aunt     died of breast cancer  . Cancer Maternal Grandmother     died of bone cancer       ADVANCED DIRECTIVES:    HEALTH MAINTENANCE: History  Substance Use Topics  . Smoking status: Former Smoker    Quit date: 07/18/2008  . Smokeless tobacco: Never Used     Comment: No smoking since bypass in 4/10  . Alcohol Use: No     Comment: He does not drink alcohol  Colonoscopy:  PAP:  Bone density:  Lipid panel:  Allergies  Allergen Reactions  . Cephalexin Hives  . Keflin [Cephalothin]     rash  . Lamisil [Terbinafine Hcl]     Rash/itchy  . Latex Dermatitis  . Lisinopril     REACTION: cough  . Zolpidem Tartrate     UNKNOWN    Current Outpatient Prescriptions  Medication Sig Dispense Refill  . ALPRAZolam (XANAX) 0.5 MG tablet Take 1 tablet (0.5 mg total) by mouth every 12 (twelve) hours as needed for anxiety. 90 tablet 0  . aspirin (ASPIR-81) 81 MG EC tablet Take 81 mg by mouth daily.      Marland Kitchen ibuprofen (ADVIL,MOTRIN) 200 MG tablet Take 200 mg by mouth every 6 (six) hours as needed for pain.    Marland Kitchen losartan (COZAAR) 50 MG tablet Take 1 tablet (50 mg total) by mouth daily. 90 tablet 3  . metFORMIN (GLUCOPHAGE-XR) 750 MG 24 hr  tablet Take 1 tablet (750 mg total) by mouth 2 (two) times daily. 60 tablet 11  . metoprolol succinate (TOPROL-XL) 50 MG 24 hr tablet Take 50 mg by mouth daily. Take with or immediately following a meal.    . Omega-3 Fatty Acids (FISH OIL) 1000 MG CAPS Take 2,000 mg by mouth daily.     Marland Kitchen omeprazole (PRILOSEC) 40 MG capsule Take 1 capsule (40 mg total) by mouth daily. 90 capsule 3  . pravastatin (PRAVACHOL) 40 MG tablet Take 1 tablet (40 mg total) by mouth daily. 90 tablet 3  . sulfacetamide (BLEPH-10) 10 % ophthalmic solution Place 1 drop into both eyes 4 (four) times daily. 15 mL 0  . traZODone (DESYREL) 100 MG tablet TAKE 2 TABLETS BY MOUTH AT BEDTIME AS NEEDED 60 tablet 1   No current facility-administered medications for this visit.    OBJECTIVE: Filed Vitals:   11/04/14 1056  BP: 127/90  Pulse: 69  Temp: 97 F (36.1 C)  Resp: 20     Body mass index is 27.96 kg/(m^2).    ECOG FS:0 - Asymptomatic  General: Well-developed, well-nourished, no acute distress. Eyes: Pink conjunctiva, anicteric sclera. Lungs: Clear to auscultation bilaterally. Heart: Regular rate and rhythm. No rubs, murmurs, or gallops. Abdomen: Soft, nontender, nondistended. No organomegaly noted, normoactive bowel sounds. Musculoskeletal: No edema, cyanosis, or clubbing. Neuro: Alert, answering all questions appropriately. Cranial nerves grossly intact. Skin: No rashes or petechiae noted. Psych: Normal affect.    LAB RESULTS:  Lab Results  Component Value Date   NA 137 07/24/2014   K 4.7 07/24/2014   CL 102 07/24/2014   CO2 28 07/24/2014   GLUCOSE 107* 07/24/2014   BUN 15 07/24/2014   CREATININE 1.29 07/24/2014   CALCIUM 9.6 07/24/2014   PROT 7.1 07/24/2014   ALBUMIN 4.5 07/24/2014   AST 28 07/24/2014   ALT 60* 07/24/2014   ALKPHOS 43 07/24/2014   BILITOT 0.7 07/24/2014   GFRNONAA 53* 01/07/2014   GFRAA >60 01/07/2014    Lab Results  Component Value Date   WBC 5.8 07/24/2014   NEUTROABS 3.5  07/24/2014   HGB 15.4 07/24/2014   HCT 44.4 07/24/2014   MCV 91.9 07/24/2014   PLT 136.0* 07/24/2014     STUDIES: Ct Chest Wo Contrast  11/12/2014   CLINICAL DATA:  Right upper lobe pulmonary nodule.  EXAM: CT CHEST WITHOUT CONTRAST  TECHNIQUE: Multidetector CT imaging of the chest was performed following the standard protocol without IV contrast.  COMPARISON:  07/03/2014  FINDINGS: Mediastinum/Nodes: Prior CABG.  Coronary artery stent. Atherosclerosis noted.  Small type 1 hiatal hernia.  No pathologic thoracic adenopathy.  Lungs/Pleura: Mild paraseptal emphysema most notable of the lung apices.  The prior right upper lobe pulmonary nodule has resolved.  Mild scarring posteriorly in the right upper lobe.  No new nodule identified.  Upper abdomen: Left renal upper pole cyst or upper pole moiety hydronephrosis noted with upper pole parenchymal atrophy. This is similar prior.  Musculoskeletal: Unremarkable  IMPRESSION: 1. The prior 6 mm right upper lobe pulmonary nodule has resolved. Accordingly, it was probably inflammatory. No further follow up required. 2. Mild scarring posteriorly in the right upper lobe. 3. Scarring of the left kidney upper pole with associated hydronephrosis or cyst. 4. Type 1 hiatal hernia.   Electronically Signed   By: Van Clines M.D.   On: 11/12/2014 08:31    ASSESSMENT: Pulmonary nodule.  PLAN:    1. Pulmonary nodule: Will order CT scan for the next 1-2 weeks to assess for interval change. Patient is not increased risk for malignancy given his heavy tobacco history. Return to clinic 1-2 days after CT scan to discuss the results and any further intervention needed.  Approximately 30 minutes was spent in discussion and consultation.  Patient expressed understanding and was in agreement with this plan. He also understands that He can call clinic at any time with any questions, concerns, or complaints.    Lloyd Huger, MD   11/16/2014 3:48 PM

## 2014-11-19 ENCOUNTER — Telehealth: Payer: Self-pay | Admitting: *Deleted

## 2014-11-19 ENCOUNTER — Ambulatory Visit: Payer: BLUE CROSS/BLUE SHIELD | Admitting: Oncology

## 2014-11-19 NOTE — Telephone Encounter (Signed)
Called to state he needs a copy of his CT scan, it is not in My Chart yet adn that he has another question as well. Requesting that Tillie Rung be the one who returns his call

## 2014-11-19 NOTE — Telephone Encounter (Signed)
Call returned, patient requesting copy of office note from last week for disability claim. Copy printed for patient and to be picked up downstairs this afternoon.

## 2014-12-03 ENCOUNTER — Other Ambulatory Visit: Payer: Self-pay | Admitting: Internal Medicine

## 2014-12-09 ENCOUNTER — Encounter: Payer: Self-pay | Admitting: Cardiovascular Disease

## 2014-12-09 ENCOUNTER — Ambulatory Visit (INDEPENDENT_AMBULATORY_CARE_PROVIDER_SITE_OTHER): Payer: BLUE CROSS/BLUE SHIELD | Admitting: Cardiovascular Disease

## 2014-12-09 VITALS — BP 118/70 | HR 86 | Ht 65.0 in | Wt 165.6 lb

## 2014-12-09 DIAGNOSIS — I2510 Atherosclerotic heart disease of native coronary artery without angina pectoris: Secondary | ICD-10-CM | POA: Diagnosis not present

## 2014-12-09 DIAGNOSIS — I10 Essential (primary) hypertension: Secondary | ICD-10-CM

## 2014-12-09 DIAGNOSIS — F17201 Nicotine dependence, unspecified, in remission: Secondary | ICD-10-CM

## 2014-12-09 MED ORDER — NITROGLYCERIN 0.4 MG SL SUBL: 0.4000 mg | SUBLINGUAL_TABLET | SUBLINGUAL | Status: DC | PRN

## 2014-12-09 NOTE — Patient Instructions (Signed)
Medication Instructions:  Your physician recommends that you continue on your current medications as directed. Please refer to the Current Medication list given to you today.   Labwork: none  Testing/Procedures: none  Follow-Up: Your physician wants you to follow-up in:  12 months.  You will receive a reminder letter in the mail two months in advance. If you don't receive a letter, please call our office to schedule the follow-up appointment.        

## 2014-12-09 NOTE — Progress Notes (Signed)
Chief Complaint  Patient presents with  . Fatigue    History of Present Illness: 59 yo WM with history of CAD s/p 4V CABG, borderline DM, GERD and nephrolithiasis admitted to [facility] 08/15/08 with an acute anterior STEMI. Emergent cath demonstrated a patent prior stent in the ostial LAD extending into the mid LAD. There was a hazy 99% stenosis in the mid LAD that was felt to be the culprit. PCI failed after multiple attempts, followed by ventricular fibrillation and the pt was taken emergently to the operating room for bypass. Emergent CABG was performed by Dr. Roxan Hockey with 4 vessels bypassed (LIMA to LAD, SVG to D2, SVG to OM2, SVG to distal RCA). After surgery, he had problems with chest wall pain and went to [facility] for evaluation by CT surgery. He underwent sternotomy with removal of sternal wires and revision by Dr. Lunette Stands on 03/28/09. Pre-operatively at [facility], he had a stress echo which showed segmental LV dysfunction with baseline EF of 40%, no stress induced ichemia. CTA showed 2/4 patent grafts (patent LIMA to LAD and patent SVG to OM. The SVG to RCA was occluded and the SVG to the Diagonal was occluded). He was admitted to [facility] 08/09/09 to 08/11/09 with chest pain. Cardiac cath repeated and showed patent LIMA to LAD, patent SVG to OM with occluded SVG to Diagonal and occluded SVG to RCA. EF was 45%. Exercise treadmill stress test August 2012 with no EKG changes with exercise. He was admitted to [facility] April 2014 with abdominal pain and found to have bowel perforation with partial colectomy and had a reversal 10/11/12. Exercise treadmill stress test June 22, 2013 with good exercise tolerance, no ischemic EKG changes. Diagnosed with diabetes spring 2015. Recent CT chest to follow pulmonary nodule which has resolved.   He is here today for follow up. He has been doing well. No chest pain or SOB. Energy level is good. He has been exercising and has been  walking 2 miles, 4 times per day.   Primary Care Physician: Dr. Silvio Pate  Last Lipid Profile:  LDL 12 October 2013  Past Medical History  Diagnosis Date  . CAD (coronary artery disease)     s/p emergent 4 V CABG (4/10) w 2/4 patent grafts by cath 08/11/09, patent LIMA to LAD & SVG to OM w moderate disease native RCA & occluded grafts to RCA and diagonal  . 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 Nephrolithiasis   . Normal echocardiogram 5/10    EF 60, apical & apicallateral HK, PASP 108mmHg. Repeat at [facility] w reported ef of 40%.   . Chest pain     secondary to sternal non-union post CABg-now s/p sternotomy w repair 03/28/09 at [facility], dr. Lunette Stands  . HTN (hypertension)   . 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 Arthritis   . History of chicken pox   . Type 2 diabetes mellitus, controlled 6/15  . GERD (gastroesophageal reflux disease)     with Schatzki ring    Past Surgical History  Procedure Laterality Date  . Coronary artery bypass graft  08/16/2008    x4  . Tonsillectomy    . Pilonidal sinus tract excision    . Vasectomy    . Inguinal hernia repair Left     Dr Ernst Spell  . Sternum separation repair  03/2009    [facility]  . Ileostomy  2014    with reversal-- after diverticulitis. Sigmoid colectomy also.  Dr Ely/Bird  . Esophageal dilation  ~2009    Dr Earlean Shawl    Current Outpatient Prescriptions  Medication Sig Dispense Refill  . ALPRAZolam (XANAX) 0.5 MG tablet Take 0.25 mg by mouth at bedtime as needed for anxiety or sleep.    Marland Kitchen aspirin (ASPIR-81) 81 MG EC tablet Take 81 mg by mouth daily.      Marland Kitchen ibuprofen (ADVIL,MOTRIN) 200 MG tablet Take 200 mg by mouth every 6 (six) hours as needed for pain.    Marland Kitchen losartan (COZAAR) 50 MG tablet Take 1 tablet (50 mg total) by mouth daily. 90 tablet 3  . metFORMIN (GLUCOPHAGE-XR) 750 MG 24 hr tablet Take 1 tablet (750 mg total) by mouth 2 (two) times daily. 60 tablet 11  .  metoprolol succinate (TOPROL-XL) 50 MG 24 hr tablet Take 50 mg by mouth daily. Take with or immediately following a meal.    . Omega-3 Fatty Acids (FISH OIL) 1000 MG CAPS Take 1,000 mg by mouth daily.     Marland Kitchen omeprazole (PRILOSEC) 40 MG capsule Take 1 capsule (40 mg total) by mouth daily. 90 capsule 3  . pravastatin (PRAVACHOL) 40 MG tablet Take 1 tablet (40 mg total) by mouth daily. 90 tablet 3  . traZODone (DESYREL) 100 MG tablet Take 150 mg by mouth at bedtime as needed for sleep.     . nitroGLYCERIN (NITROSTAT) 0.4 MG SL tablet Place 1 tablet (0.4 mg total) under the tongue every 5 (five) minutes as needed for chest pain. 25 tablet 6   No current facility-administered medications for this visit.    Allergies  Allergen Reactions  . Cephalexin Hives  . Keflin [Cephalothin]     rash  . Lamisil [Terbinafine Hcl]     Rash/itchy  . Latex Dermatitis  . Lisinopril     REACTION: cough  . Zolpidem Tartrate     UNKNOWN    History   Social History  . Marital Status: Married    Spouse Name: N/A    Number of Children: 3  . Years of Education: N/A   Occupational History  . Not on file.   Social History Main Topics  . Smoking status: Former Smoker    Quit date: 07/18/2008  . Smokeless tobacco: Not on file     Comment: No smoking since bypass in 4/10  . Alcohol Use: No       . Drug Use: No  . Sexually Active: Not on file   Other Topics Concern  . Not on file   Social History Narrative       Family History  Problem Relation Age of Onset  . Heart failure Mother   . Diabetes Mother   . Cancer Mother     ovarian?  . Heart disease Mother   . Congestive Heart Failure Mother     cause of death  . Heart attack Father   . Heart attack Brother   . Diabetes Brother   . Heart attack Brother   . Diabetes Brother   . Heart attack Sister   . Diabetes Sister   . Cancer Maternal Aunt     died of breast cancer  . Cancer Maternal Grandmother     died of bone cancer    Review  of Systems:  As stated in the HPI and otherwise negative.   BP 118/70 mmHg  Pulse 86  Ht 5\' 5"  (1.651 m)  Wt 165 lb 9.6 oz (75.116 kg)  BMI 27.56 kg/m2  SpO2 99%  Physical Examination: General: Well developed, well nourished, NAD HEENT: OP clear, mucus membranes moist SKIN: warm, dry. No rashes. Neuro: No focal deficits Musculoskeletal: Muscle strength 5/5 all ext Psychiatric: Mood and affect normal Neck: No JVD, no carotid bruits, no thyromegaly, no lymphadenopathy. Lungs:Clear bilaterally, no wheezes, rhonci, crackles Cardiovascular: Regular rate and rhythm. No murmurs, gallops or rubs. Abdomen:Soft. Bowel sounds present. Non-tender.  Extremities: No lower extremity edema. Pulses are 2 + in the bilateral DP/PT.  EKG:  EKG is not ordered today. The ekg ordered today demonstrates   Recent Labs: 07/24/2014: ALT 60*; BUN 15; Creatinine, Ser 1.29; Hemoglobin 15.4; Platelets 136.0*; Potassium 4.7; Sodium 137   Lipid Panel    Component Value Date/Time   CHOL 101 07/24/2014 1030   CHOL 92 09/14/2012 1521   TRIG 156.0* 07/24/2014 1030   TRIG 230* 09/14/2012 1521   HDL 27.00* 07/24/2014 1030   HDL 22* 09/14/2012 1521   CHOLHDL 4 07/24/2014 1030   VLDL 31.2 07/24/2014 1030   VLDL 46* 09/14/2012 1521   LDLCALC 43 07/24/2014 1030   LDLCALC 24 09/14/2012 1521     Wt Readings from Last 3 Encounters:  12/09/14 165 lb 9.6 oz (75.116 kg)  11/14/14 167 lb 1.7 oz (75.8 kg)  11/04/14 167 lb 15.9 oz (76.2 kg)     Other studies Reviewed: Additional studies/ records that were reviewed today include: . Review of the above records demonstrates:   Assessment and Plan:   1. CAD: Stable. No symptoms of unstable angina. Will continue current therapy. BP controlled. He is on a statin. Lipids followed in primary care and controlled per pt.   2. Tobacco abuse, in remission: He stopped smoking in 2010   3. HTN: BP controlled. No changes today.   Current medicines are reviewed at length  with the patient today.  The patient does not have concerns regarding medicines.  The following changes have been made:  no change  Labs/ tests ordered today include:  No orders of the defined types were placed in this encounter.    Disposition:   FU with me in 6 months  Signed, [name], MD 12/09/2014 12:37 PM    [address], [place], [place]  [address] Phone: [phone]; Fax: [phone]

## 2015-01-06 ENCOUNTER — Ambulatory Visit: Payer: BLUE CROSS/BLUE SHIELD

## 2015-01-11 ENCOUNTER — Other Ambulatory Visit: Payer: Self-pay | Admitting: Internal Medicine

## 2015-01-28 ENCOUNTER — Ambulatory Visit (INDEPENDENT_AMBULATORY_CARE_PROVIDER_SITE_OTHER): Payer: BLUE CROSS/BLUE SHIELD | Admitting: Internal Medicine

## 2015-01-28 ENCOUNTER — Encounter: Payer: Self-pay | Admitting: Internal Medicine

## 2015-01-28 VITALS — BP 118/70 | HR 68 | Temp 97.7°F | Wt 166.0 lb

## 2015-01-28 DIAGNOSIS — I2510 Atherosclerotic heart disease of native coronary artery without angina pectoris: Secondary | ICD-10-CM

## 2015-01-28 DIAGNOSIS — K2100 Gastro-esophageal reflux disease with esophagitis, without bleeding: Secondary | ICD-10-CM

## 2015-01-28 DIAGNOSIS — E118 Type 2 diabetes mellitus with unspecified complications: Secondary | ICD-10-CM | POA: Diagnosis not present

## 2015-01-28 DIAGNOSIS — G4700 Insomnia, unspecified: Secondary | ICD-10-CM

## 2015-01-28 LAB — HM DIABETES FOOT EXAM

## 2015-01-28 LAB — HEMOGLOBIN A1C: Hgb A1c MFr Bld: 5.4 % (ref 4.6–6.5)

## 2015-01-28 NOTE — Assessment & Plan Note (Signed)
Does okay with current meds

## 2015-01-28 NOTE — Progress Notes (Signed)
Subjective:    Patient ID: Joseph [name], male    DOB: 09-Dec-1955, 59 y.o.   MRN: [id number]  HPI Here for follow up of diabetes and other medical conditions  Recent visit with cardiologist Generally still exercising--though some limitations due to fasciitis No chest pain No SOB---stable exercise tolerance  Had another CT since last visit Nodule resolved Released from follow up  Checks sugars several times a week Somewhat higher-- now 90-125 No hypoglycemic reactions Mild numbness in feet---keeps up with Dr Paulla Dolly.   No trouble with statin No myalgias  Sleeps okay with trazodone and alprazolam Current Outpatient Prescriptions on File Prior to Visit  Medication Sig Dispense Refill  . ALPRAZolam (XANAX) 0.5 MG tablet Take 0.25 mg by mouth at bedtime as needed for anxiety or sleep.    Marland Kitchen aspirin (ASPIR-81) 81 MG EC tablet Take 81 mg by mouth daily.      Marland Kitchen ibuprofen (ADVIL,MOTRIN) 200 MG tablet Take 200 mg by mouth every 6 (six) hours as needed for pain.    Marland Kitchen losartan (COZAAR) 50 MG tablet Take 1 tablet (50 mg total) by mouth daily. 90 tablet 3  . metFORMIN (GLUCOPHAGE-XR) 750 MG 24 hr tablet Take 1 tablet (750 mg total) by mouth 2 (two) times daily. 60 tablet 11  . metoprolol succinate (TOPROL-XL) 50 MG 24 hr tablet TAKE 1 TABLET BY MOUTH EVERY DAY 90 tablet 2  . nitroGLYCERIN (NITROSTAT) 0.4 MG SL tablet Place 1 tablet (0.4 mg total) under the tongue every 5 (five) minutes as needed for chest pain. 25 tablet 6  . Omega-3 Fatty Acids (FISH OIL) 1000 MG CAPS Take 1,000 mg by mouth daily.     Marland Kitchen omeprazole (PRILOSEC) 40 MG capsule Take 1 capsule (40 mg total) by mouth daily. 90 capsule 3  . pravastatin (PRAVACHOL) 40 MG tablet Take 1 tablet (40 mg total) by mouth daily. 90 tablet 3  . traZODone (DESYREL) 100 MG tablet Take 150 mg by mouth at bedtime as needed for sleep.      No current facility-administered medications on file prior to visit.    Allergies  Allergen Reactions  .  Cephalexin Hives  . Keflin [Cephalothin]     rash  . Lamisil [Terbinafine Hcl]     Rash/itchy  . Latex Dermatitis  . Lisinopril     REACTION: cough  . Zolpidem Tartrate     UNKNOWN    Past Medical History  Diagnosis Date  . CAD (coronary artery disease)     s/p emergent 4 V CABG (4/10) w 2/4 patent grafts by cath 08/11/09, patent LIMA to LAD & SVG to OM w moderate disease native RCA & occluded grafts to RCA and diagonal  . 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 Nephrolithiasis   . Normal echocardiogram 5/10    EF 60, apical & apicallateral HK, PASP 70mmHg. Repeat at [facility] w reported ef of 40%.   . Chest pain     secondary to sternal non-union post CABg-now s/p sternotomy w repair 03/28/09 at [facility], dr. Lunette Stands  . HTN (hypertension)   . 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 Arthritis   . History of chicken pox   . Type 2 diabetes mellitus, controlled ([place]) 6/15  . GERD (gastroesophageal reflux disease)     with Schatzki ring    Past Surgical History  Procedure Laterality Date  . Coronary artery bypass graft  08/16/2008    x4  . Tonsillectomy    .  Pilonidal sinus tract excision    . Vasectomy    . Inguinal hernia repair Left     Dr Ernst Spell  . Sternum separation repair  03/2009    [facility]  . Ileostomy  2014    with reversal-- after diverticulitis. Sigmoid colectomy also. Dr Ely/Bird  . Esophageal dilation  ~2009    Dr Earlean Shawl    Family History  Problem Relation Age of Onset  . Heart failure Mother   . Diabetes Mother   . Cancer Mother     ovarian?  . Heart disease Mother   . Congestive Heart Failure Mother     cause of death  . Heart attack Father   . Heart attack Brother   . Diabetes Brother   . Heart attack Brother   . Diabetes Brother   . Heart attack Sister   . Diabetes Sister   . Cancer Maternal Aunt     died of breast cancer  . Cancer Maternal Grandmother     died of bone cancer      Social History   Social History  . Marital Status: Married    Spouse Name: N/A  . Number of Children: 3  . Years of Education: N/A   Occupational History  . Broadcaster/advertising sales     Disabled   Social History Main Topics  . Smoking status: Former Smoker    Quit date: 07/18/2008  . Smokeless tobacco: Never Used     Comment: No smoking since bypass in 4/10  . Alcohol Use: No     Comment: He does not drink alcohol  . Drug Use: No  . Sexual Activity: Not on file   Other Topics Concern  . Not on file   Social History Narrative   No living will   Wife is health care POA   Would accept resuscitation   Not sure about tube feeds--probably not prolonged   Review of Systems  Weight is stable Appetite is good--somewhat lapse with carbs lately     Objective:   Physical Exam  Constitutional: He appears well-developed and well-nourished. No distress.  Neck: Normal range of motion. Neck supple. No thyromegaly present.  Cardiovascular: Normal rate, regular rhythm, normal heart sounds and intact distal pulses.  Exam reveals no gallop.   No murmur heard. Pulmonary/Chest: Effort normal and breath sounds normal. No respiratory distress. He has no wheezes. He has no rales.  Abdominal: Soft. There is no tenderness.  Musculoskeletal: He exhibits no edema or tenderness.  Lymphadenopathy:    He has no cervical adenopathy.  Neurological:  Normal sensation to monofilament in feet  Skin: No rash noted. No erythema.  No foot lesions  Psychiatric: He has a normal mood and affect. His behavior is normal.          Assessment & Plan:

## 2015-01-28 NOTE — Progress Notes (Signed)
Pre visit review using our clinic review tool, if applicable. No additional management support is needed unless otherwise documented below in the visit note. 

## 2015-01-28 NOTE — Assessment & Plan Note (Signed)
No active symptoms On appropriate medication regimen

## 2015-01-28 NOTE — Assessment & Plan Note (Signed)
Seems to still have good control Will check A1c 

## 2015-01-28 NOTE — Assessment & Plan Note (Signed)
Doing okay with Rx 

## 2015-02-26 ENCOUNTER — Encounter: Payer: Self-pay | Admitting: Internal Medicine

## 2015-02-26 ENCOUNTER — Ambulatory Visit (INDEPENDENT_AMBULATORY_CARE_PROVIDER_SITE_OTHER): Payer: BLUE CROSS/BLUE SHIELD | Admitting: Internal Medicine

## 2015-02-26 VITALS — BP 100/70 | HR 70 | Temp 98.4°F | Resp 12 | Wt 164.0 lb

## 2015-02-26 DIAGNOSIS — J069 Acute upper respiratory infection, unspecified: Secondary | ICD-10-CM | POA: Insufficient documentation

## 2015-02-26 NOTE — Progress Notes (Signed)
Subjective:    Patient ID: Joseph [name], male    DOB: [birth year], 59 y.o.   MRN: [id number]  HPI Here due to respiratory symptoms "sinus cold" Concerned due to pneumonia last year  Started as sore throat and body aches Clear rhinorrhea and drainage in throat Pressure in throat and ears No fever Only a little cough--mostly sneezing Got this from wife--and granddaughter  Going on for 3-4 days May be slightly better or stable No SOB Some chills at night--no sweats No change in activity--but stopped treadmill with illness  No meds other than ibuprofen  Current Outpatient Prescriptions on File Prior to Visit  Medication Sig Dispense Refill  . ALPRAZolam (XANAX) 0.5 MG tablet Take 0.25 mg by mouth at bedtime as needed for anxiety or sleep.    Marland Kitchen aspirin (ASPIR-81) 81 MG EC tablet Take 81 mg by mouth daily.      Marland Kitchen losartan (COZAAR) 50 MG tablet Take 1 tablet (50 mg total) by mouth daily. 90 tablet 3  . metFORMIN (GLUCOPHAGE-XR) 750 MG 24 hr tablet Take 1 tablet (750 mg total) by mouth 2 (two) times daily. 60 tablet 11  . metoprolol succinate (TOPROL-XL) 50 MG 24 hr tablet TAKE 1 TABLET BY MOUTH EVERY DAY 90 tablet 2  . nitroGLYCERIN (NITROSTAT) 0.4 MG SL tablet Place 1 tablet (0.4 mg total) under the tongue every 5 (five) minutes as needed for chest pain. 25 tablet 6  . Omega-3 Fatty Acids (FISH OIL) 1000 MG CAPS Take 1,000 mg by mouth daily.     Marland Kitchen omeprazole (PRILOSEC) 40 MG capsule Take 1 capsule (40 mg total) by mouth daily. 90 capsule 3  . pravastatin (PRAVACHOL) 40 MG tablet Take 1 tablet (40 mg total) by mouth daily. 90 tablet 3  . traZODone (DESYREL) 100 MG tablet Take 150 mg by mouth at bedtime as needed for sleep.      No current facility-administered medications on file prior to visit.    Allergies  Allergen Reactions  . Cephalexin Hives  . Keflin [Cephalothin]     rash  . Lamisil [Terbinafine Hcl]     Rash/itchy  . Latex Dermatitis  . Lisinopril     REACTION:  cough  . Zolpidem Tartrate     UNKNOWN    Past Medical History  Diagnosis Date  . CAD (coronary artery disease)     s/p emergent 4 V CABG (4/10) w 2/4 patent grafts by cath 08/11/09, patent LIMA to LAD & SVG to OM w moderate disease native RCA & occluded grafts to RCA and diagonal  . 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 Nephrolithiasis   . Normal echocardiogram 5/10    EF 60, apical & apicallateral HK, PASP 71mmHg. Repeat at [facility] w reported ef of 40%.   . Chest pain     secondary to sternal non-union post CABg-now s/p sternotomy w repair 03/28/09 at [facility], dr. Lunette Stands  . HTN (hypertension)   . 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 Arthritis   . History of chicken pox   . Type 2 diabetes mellitus, controlled ([place]) 6/15  . GERD (gastroesophageal reflux disease)     with Schatzki ring    Past Surgical History  Procedure Laterality Date  . Coronary artery bypass graft  08/16/2008    x4  . Tonsillectomy    . Pilonidal sinus tract excision    . Vasectomy    . Inguinal hernia repair Left     Dr  Harmon  . Sternum separation repair  03/2009    [facility]  . Ileostomy  2014    with reversal-- after diverticulitis. Sigmoid colectomy also. Dr Ely/Bird  . Esophageal dilation  ~2009    Dr Earlean Shawl    Family History  Problem Relation Age of Onset  . Heart failure Mother   . Diabetes Mother   . Cancer Mother     ovarian?  . Heart disease Mother   . Congestive Heart Failure Mother     cause of death  . Heart attack Father   . Heart attack Brother   . Diabetes Brother   . Heart attack Brother   . Diabetes Brother   . Heart attack Sister   . Diabetes Sister   . Cancer Maternal Aunt     died of breast cancer  . Cancer Maternal Grandmother     died of bone cancer    Social History   Social History  . Marital Status: Married    Spouse Name: N/A  . Number of Children: 3  . Years of Education: N/A    Occupational History  . Broadcaster/advertising sales     Disabled   Social History Main Topics  . Smoking status: Former Smoker    Quit date: 07/18/2008  . Smokeless tobacco: Never Used     Comment: No smoking since bypass in 4/10  . Alcohol Use: No     Comment: He does not drink alcohol  . Drug Use: No  . Sexual Activity: Not on file   Other Topics Concern  . Not on file   Social History Narrative   No living will   Wife is health care POA   Would accept resuscitation   Not sure about tube feeds--probably not prolonged   Review of Systems No rash--but has mild rash in groin (yeast--not new). Monistat works No vomiting or diarrhea Appetite is down    Objective:   Physical Exam  Constitutional: He appears well-developed and well-nourished. No distress.  HENT:  TMs normal Mild nasal inflammation Slight pharyngeal injection  Neck: Normal range of motion. Neck supple. No thyromegaly present.  Pulmonary/Chest: Effort normal and breath sounds normal. No respiratory distress. He has no wheezes. He has no rales.  Lymphadenopathy:    He has no cervical adenopathy.          Assessment & Plan:

## 2015-02-26 NOTE — Assessment & Plan Note (Signed)
Clearly viral Discussed signs of secondary bacterial infection Would try empiric antibiotic if worsening next week Discussed symptom relief

## 2015-02-26 NOTE — Progress Notes (Signed)
Pre visit review using our clinic review tool, if applicable. No additional management support is needed unless otherwise documented below in the visit note. 

## 2015-02-28 ENCOUNTER — Telehealth: Payer: Self-pay

## 2015-02-28 ENCOUNTER — Ambulatory Visit: Payer: BLUE CROSS/BLUE SHIELD | Admitting: Family Medicine

## 2015-02-28 MED ORDER — AMOXICILLIN 500 MG PO TABS: 1000.0000 mg | ORAL_TABLET | Freq: Two times a day (BID) | ORAL | Status: DC

## 2015-02-28 NOTE — Telephone Encounter (Signed)
Pt left v/m; pt was seen 02/26/15; today pt has fever of 99.4 and head congestion is worse than when seen 02/26/15. Pt has hx of pneumonia and does not want to wait until first of next week for abx.[facility]. Pt request cb.

## 2015-02-28 NOTE — Telephone Encounter (Signed)
Please let him know I sent the prescription for him

## 2015-02-28 NOTE — Telephone Encounter (Signed)
Spoke with patient and advised results   

## 2015-03-30 ENCOUNTER — Other Ambulatory Visit: Payer: Self-pay | Admitting: Internal Medicine

## 2015-05-01 ENCOUNTER — Other Ambulatory Visit: Payer: Self-pay

## 2015-05-01 MED ORDER — ALPRAZOLAM 0.5 MG PO TABS: 0.2500 mg | ORAL_TABLET | Freq: Every evening | ORAL | Status: DC | PRN

## 2015-05-01 NOTE — Telephone Encounter (Signed)
Approved: #[id number] x 0 

## 2015-05-01 NOTE — Telephone Encounter (Signed)
[facility] left v/m requesting refill alprazolam. Last refilled # 90 on 11/11/14. Pt last seen 01/28/15.

## 2015-05-01 NOTE — Telephone Encounter (Signed)
rx called into pharmacy

## 2015-05-27 LAB — HM DIABETES EYE EXAM

## 2015-06-05 ENCOUNTER — Encounter: Payer: Self-pay | Admitting: Internal Medicine

## 2015-06-28 ENCOUNTER — Other Ambulatory Visit: Payer: Self-pay | Admitting: Cardiovascular Disease

## 2015-07-15 ENCOUNTER — Other Ambulatory Visit: Payer: Self-pay | Admitting: Internal Medicine

## 2015-07-29 ENCOUNTER — Ambulatory Visit: Payer: BLUE CROSS/BLUE SHIELD | Admitting: Internal Medicine

## 2015-07-30 ENCOUNTER — Ambulatory Visit (INDEPENDENT_AMBULATORY_CARE_PROVIDER_SITE_OTHER): Payer: BLUE CROSS/BLUE SHIELD | Admitting: Internal Medicine

## 2015-07-30 ENCOUNTER — Encounter: Payer: Self-pay | Admitting: Internal Medicine

## 2015-07-30 VITALS — BP 110/70 | HR 63 | Temp 98.2°F | Wt 164.0 lb

## 2015-07-30 DIAGNOSIS — I2510 Atherosclerotic heart disease of native coronary artery without angina pectoris: Secondary | ICD-10-CM

## 2015-07-30 DIAGNOSIS — M545 Low back pain: Secondary | ICD-10-CM

## 2015-07-30 DIAGNOSIS — I10 Essential (primary) hypertension: Secondary | ICD-10-CM | POA: Diagnosis not present

## 2015-07-30 DIAGNOSIS — E119 Type 2 diabetes mellitus without complications: Secondary | ICD-10-CM

## 2015-07-30 DIAGNOSIS — G8929 Other chronic pain: Secondary | ICD-10-CM

## 2015-07-30 LAB — LIPID PANEL
CHOL/HDL RATIO: 4
Cholesterol: 115 mg/dL (ref 0–200)
HDL: 27.3 mg/dL — AB (ref >39.00)
NONHDL: 87.75
TRIGLYCERIDES: 225 mg/dL — AB (ref 0.0–149.0)
VLDL: 45 mg/dL — AB (ref 0.0–40.0)

## 2015-07-30 LAB — CBC WITH DIFFERENTIAL/PLATELET
BASOS ABS: 0 10*3/uL (ref 0.0–0.1)
BASOS PCT: 0.7 % (ref 0.0–3.0)
EOS ABS: 0.2 10*3/uL (ref 0.0–0.7)
Eosinophils Relative: 3.7 % (ref 0.0–5.0)
HEMATOCRIT: 43.6 % (ref 39.0–52.0)
HEMOGLOBIN: 14.9 g/dL (ref 13.0–17.0)
LYMPHS PCT: 27.7 % (ref 12.0–46.0)
Lymphs Abs: 1.7 10*3/uL (ref 0.7–4.0)
MCHC: 34.2 g/dL (ref 30.0–36.0)
MCV: 92.1 fl (ref 78.0–100.0)
Monocytes Absolute: 0.5 10*3/uL (ref 0.1–1.0)
Monocytes Relative: 7.7 % (ref 3.0–12.0)
Neutro Abs: 3.8 10*3/uL (ref 1.4–7.7)
Neutrophils Relative %: 60.2 % (ref 43.0–77.0)
Platelets: 161 10*3/uL (ref 150.0–400.0)
RBC: 4.73 Mil/uL (ref 4.22–5.81)
RDW: 13.7 % (ref 11.5–15.5)
WBC: 6.3 10*3/uL (ref 4.0–10.5)

## 2015-07-30 LAB — COMPREHENSIVE METABOLIC PANEL
ALBUMIN: 4.6 g/dL (ref 3.5–5.2)
ALK PHOS: 42 U/L (ref 39–117)
ALT: 41 U/L (ref 0–53)
AST: 22 U/L (ref 0–37)
BILIRUBIN TOTAL: 0.6 mg/dL (ref 0.2–1.2)
BUN: 20 mg/dL (ref 6–23)
CALCIUM: 9.7 mg/dL (ref 8.4–10.5)
CHLORIDE: 105 meq/L (ref 96–112)
CO2: 28 mEq/L (ref 19–32)
CREATININE: 1.15 mg/dL (ref 0.40–1.50)
GFR: 68.53 mL/min (ref >60.00)
Glucose, Bld: 77 mg/dL (ref 70–99)
Potassium: 4.7 mEq/L (ref 3.5–5.1)
Sodium: 138 mEq/L (ref 135–145)
Total Protein: 7.3 g/dL (ref 6.0–8.3)

## 2015-07-30 LAB — LDL CHOLESTEROL, DIRECT: Direct LDL: 63 mg/dL

## 2015-07-30 LAB — HEMOGLOBIN A1C: Hgb A1c MFr Bld: 5.5 % (ref 4.6–6.5)

## 2015-07-30 MED ORDER — CYCLOBENZAPRINE HCL 10 MG PO TABS
5.0000 mg | ORAL_TABLET | Freq: Three times a day (TID) | ORAL | Status: DC | PRN
Start: 2015-07-30 — End: 2022-02-22

## 2015-07-30 NOTE — Assessment & Plan Note (Signed)
BP Readings from Last 3 Encounters:  07/30/15 110/70  02/26/15 100/70  01/28/15 118/70   Good control

## 2015-07-30 NOTE — Assessment & Plan Note (Signed)
Doing well On appropriate regimen

## 2015-07-30 NOTE — Assessment & Plan Note (Signed)
Still seems to have good control Due for labs 

## 2015-07-30 NOTE — Progress Notes (Signed)
Subjective:    Patient ID: Joseph [name], male    DOB: 06/11/1955, 60 y.o.   MRN: [id number]  HPI Here for follow up of chronic medical conditions--esp diabetes  Doing fairly well Does have some chronic pain across shoulders and upper back Gets burning and tightness in muscle Has used cyclobenzaprine in past with success Wife does massage with success at times May use once every 2 months  No chest pain--other than chest wall (positional but not exertional) No SOB No dizziness or syncope No edema Needs note for permission to use exercise equipment where he lives  Checks sugars regularly Stable though at 100-125 No severe hypoglycemic reactions--just slight weakness/dizzy feeling No foot sensory changes or ulcers  Some arthritis in knees and hips Notices on stairs especially Some foot pain--esp plantar fasciitis  Review of Systems Sleeps okay with the trazodone and alprazolam Appetite is good Weight is stable  Current Outpatient Prescriptions on File Prior to Visit  Medication Sig Dispense Refill  . ALPRAZolam (XANAX) 0.5 MG tablet Take 0.5 tablets (0.25 mg total) by mouth at bedtime as needed for anxiety or sleep. 90 tablet 0  . aspirin (ASPIR-81) 81 MG EC tablet Take 81 mg by mouth daily.      Marland Kitchen ibuprofen (ADVIL,MOTRIN) 200 MG tablet Take 400 mg by mouth 2 (two) times daily as needed.    Marland Kitchen losartan (COZAAR) 50 MG tablet TAKE 1 TABLET (50 MG TOTAL) BY MOUTH DAILY. 90 tablet 1  . metFORMIN (GLUCOPHAGE-XR) 750 MG 24 hr tablet TAKE 1 TABLET (750 MG TOTAL) BY MOUTH 2 (TWO) TIMES DAILY. 60 tablet 10  . metoprolol succinate (TOPROL-XL) 50 MG 24 hr tablet TAKE 1 TABLET BY MOUTH EVERY DAY 90 tablet 2  . nitroGLYCERIN (NITROSTAT) 0.4 MG SL tablet Place 1 tablet (0.4 mg total) under the tongue every 5 (five) minutes as needed for chest pain. 25 tablet 6  . Omega-3 Fatty Acids (FISH OIL) 1000 MG CAPS Take 1,000 mg by mouth daily.     Marland Kitchen omeprazole (PRILOSEC) 40 MG capsule Take 1  capsule (40 mg total) by mouth daily. 90 capsule 3  . ONE TOUCH ULTRA TEST test strip USE 3 (THREE) TIMES DAILY. 100 each 2  . pravastatin (PRAVACHOL) 40 MG tablet Take 1 tablet (40 mg total) by mouth daily. 90 tablet 3  . traZODone (DESYREL) 100 MG tablet Take 150 mg by mouth at bedtime as needed for sleep.      No current facility-administered medications on file prior to visit.    Allergies  Allergen Reactions  . Cephalexin Hives  . Keflin [Cephalothin]     rash  . Lamisil [Terbinafine Hcl]     Rash/itchy  . Latex Dermatitis  . Lisinopril     REACTION: cough  . Zolpidem Tartrate     UNKNOWN    Past Medical History  Diagnosis Date  . CAD (coronary artery disease)     s/p emergent 4 V CABG (4/10) w 2/4 patent grafts by cath 08/11/09, patent LIMA to LAD & SVG to OM w moderate disease native RCA & occluded grafts to RCA and diagonal  . 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 Nephrolithiasis   . Normal echocardiogram 5/10    EF 60, apical & apicallateral HK, PASP 76mmHg. Repeat at [facility] w reported ef of 40%.   . Chest pain     secondary to sternal non-union post CABg-now s/p sternotomy w repair 03/28/09 at [facility], dr. Lunette Stands  .  HTN (hypertension)   . 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 Arthritis   . History of chicken pox   . Type 2 diabetes mellitus, controlled ([place]) 6/15  . GERD (gastroesophageal reflux disease)     with Schatzki ring    Past Surgical History  Procedure Laterality Date  . Coronary artery bypass graft  08/16/2008    x4  . Tonsillectomy    . Pilonidal sinus tract excision    . Vasectomy    . Inguinal hernia repair Left     Dr Ernst Spell  . Sternum separation repair  03/2009    [facility]  . Ileostomy  2014    with reversal-- after diverticulitis. Sigmoid colectomy also. Dr Ely/Bird  . Esophageal dilation  ~2009    Dr Earlean Shawl    Family History  Problem Relation Age of Onset  .  Heart failure Mother   . Diabetes Mother   . Cancer Mother     ovarian?  . Heart disease Mother   . Congestive Heart Failure Mother     cause of death  . Heart attack Father   . Heart attack Brother   . Diabetes Brother   . Heart attack Brother   . Diabetes Brother   . Heart attack Sister   . Diabetes Sister   . Cancer Maternal Aunt     died of breast cancer  . Cancer Maternal Grandmother     died of bone cancer    Social History   Social History  . Marital Status: Married    Spouse Name: N/A  . Number of Children: 3  . Years of Education: N/A   Occupational History  . Broadcaster/advertising sales     Disabled   Social History Main Topics  . Smoking status: Former Smoker    Quit date: 07/18/2008  . Smokeless tobacco: Never Used     Comment: No smoking since bypass in 4/10  . Alcohol Use: No     Comment: He does not drink alcohol  . Drug Use: No  . Sexual Activity: Not on file   Other Topics Concern  . Not on file   Social History Narrative   No living will   Wife is health care POA   Would accept resuscitation   Not sure about tube feeds--probably not prolonged      Objective:   Physical Exam  Constitutional: He appears well-developed and well-nourished. No distress.  Neck: Normal range of motion. Neck supple. No thyromegaly present.  Cardiovascular: Normal rate, regular rhythm, normal heart sounds and intact distal pulses.  Exam reveals no gallop.   No murmur heard. Pulmonary/Chest: Effort normal and breath sounds normal. No respiratory distress. He has no wheezes. He has no rales.  Musculoskeletal: He exhibits no edema.  Lymphadenopathy:    He has no cervical adenopathy.  Psychiatric: He has a normal mood and affect. His behavior is normal.          Assessment & Plan:

## 2015-07-30 NOTE — Assessment & Plan Note (Signed)
Uses ibuprofen rarely Flexeril for neck and shoulders

## 2015-07-30 NOTE — Progress Notes (Signed)
Pre visit review using our clinic review tool, if applicable. No additional management support is needed unless otherwise documented below in the visit note. 

## 2015-09-28 ENCOUNTER — Other Ambulatory Visit: Payer: Self-pay | Admitting: Cardiovascular Disease

## 2015-10-10 ENCOUNTER — Other Ambulatory Visit: Payer: Self-pay | Admitting: Internal Medicine

## 2015-10-11 ENCOUNTER — Other Ambulatory Visit: Payer: Self-pay | Admitting: Internal Medicine

## 2015-10-19 ENCOUNTER — Other Ambulatory Visit: Payer: Self-pay | Admitting: Internal Medicine

## 2015-11-10 ENCOUNTER — Other Ambulatory Visit: Payer: Self-pay | Admitting: Internal Medicine

## 2015-11-16 ENCOUNTER — Other Ambulatory Visit: Payer: Self-pay | Admitting: Internal Medicine

## 2016-01-05 ENCOUNTER — Telehealth: Payer: Self-pay | Admitting: Cardiovascular Disease

## 2016-01-05 NOTE — Telephone Encounter (Signed)
New Message  Pt call requesting to speak with RN. Pt call to give bp Reading. Pt states the have not been normal to him and would like to discuss with RN. Pt was set up for an appt with Dr. Angelena Form on 9/20. Please call back to discuss   Pt states his normal reading 115/75 pulse 65  Yesterday 9/17 141/84 pulse 63 144/96 pulse 56 142/93 pulse 58 128/73 pulse 52 Bed time after laying down 113/76 pulse 57  This morning 9/18  133/83 pulse 59 131/84 pulse 60

## 2016-01-05 NOTE — Telephone Encounter (Signed)
Spoke with pt. He reports blood pressure is usually around 115/75.  Recent readings as listed below. He reports a headache over the weekend. No headache today.  Did have stomach ache and nausea yesterday.  No chest pain.  Readings today were before taking Toprol and Losartan and 30 minutes after.  I told pt to check BP and pulse later today and also tomorrow about 2 hours after taking medications.  I asked him to bring readings to appt on 01/07/16.

## 2016-01-06 NOTE — Telephone Encounter (Signed)
Agree. Thanks

## 2016-01-07 ENCOUNTER — Ambulatory Visit: Payer: BLUE CROSS/BLUE SHIELD | Admitting: Cardiovascular Disease

## 2016-01-07 NOTE — Progress Notes (Deleted)
No chief complaint on file.   History of Present Illness: 60 yo WM with history of CAD s/p 4V CABG, HTN, borderline DM, GERD and nephrolithiasis who is here today for cardiac follow up. He was admitted to [facility] 08/15/08 with an acute anterior STEMI. Emergent cath demonstrated a patent prior stent in the ostial LAD extending into the mid LAD. There was a hazy 99% stenosis in the mid LAD that was felt to be the culprit. PCI failed after multiple attempts, followed by ventricular fibrillation and the pt was taken emergently to the operating room for bypass. Emergent CABG was performed by Dr. Roxan Hockey with 4 vessels bypassed (LIMA to LAD, SVG to D2, SVG to OM2, SVG to distal RCA). After surgery, he had problems with chest wall pain and went to [facility] for evaluation by CT surgery. He underwent sternotomy with removal of sternal wires and revision by Dr. Lunette Stands on 03/28/09. Pre-operatively at [facility], he had a stress echo which showed segmental LV dysfunction with baseline EF of 40%, no stress induced ichemia. CTA showed 2/4 patent grafts (patent LIMA to LAD and patent SVG to OM. The SVG to RCA was occluded and the SVG to the Diagonal was occluded). Cardiac cath 2011 showed patent LIMA to LAD, patent SVG to OM with occluded SVG to Diagonal and occluded SVG to RCA. EF was 45%. He was admitted to [facility] April 2014 with abdominal pain and found to have bowel perforation with partial colectomy and had a reversal 10/11/12. Exercise treadmill stress test June 22, 2013 with good exercise tolerance, no ischemic EKG changes. Diagnosed with diabetes spring 2015.   He is here today for follow up. He has been doing well. No chest pain or SOB. Energy level is good. He has been exercising and has been walking 2 miles, 4 times per day.   Primary Care Physician: Viviana Simpler, MD   Past Medical History:  Diagnosis Date  . Arthritis   . CAD (coronary artery disease)    s/p emergent 4 V CABG  (4/10) w 2/4 patent grafts by cath 08/11/09, patent LIMA to LAD & SVG to OM w moderate disease native RCA & occluded grafts to RCA and diagonal  . Chest pain    secondary to sternal non-union post CABg-now s/p sternotomy w repair 03/28/09 at [facility], dr. Lunette Stands  . GERD (gastroesophageal reflux disease)    with Schatzki ring  . History of chicken pox   . HTN (hypertension)   . 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 Nephrolithiasis   . Normal echocardiogram 5/10   EF 60, apical & apicallateral HK, PASP 80mmHg. Repeat at [facility] w reported ef of 40%.   . Type 2 diabetes mellitus, controlled ([place]) 6/15    Past Surgical History:  Procedure Laterality Date  . CORONARY ARTERY BYPASS GRAFT  08/16/2008   x4  . ESOPHAGEAL DILATION  ~2009   Dr Earlean Shawl  . ILEOSTOMY  2014   with reversal-- after diverticulitis. Sigmoid colectomy also. Dr Ely/Bird  . INGUINAL HERNIA REPAIR Left    Dr Ernst Spell  . pilonidal sinus tract excision    . STERNUM SEPARATION REPAIR  03/2009   [facility]  . TONSILLECTOMY    . VASECTOMY      Current Outpatient Prescriptions  Medication Sig Dispense Refill  . ALPRAZolam (XANAX) 0.5 MG tablet Take 0.5 tablets (0.25 mg total) by mouth at bedtime as needed for anxiety  or sleep. 90 tablet 0  . aspirin (ASPIR-81) 81 MG EC tablet Take 81 mg by mouth daily.      . cyclobenzaprine (FLEXERIL) 10 MG tablet Take 0.5-1 tablets (5-10 mg total) by mouth 3 (three) times daily as needed for muscle spasms. 30 tablet 1  . ibuprofen (ADVIL,MOTRIN) 200 MG tablet Take 400 mg by mouth 2 (two) times daily as needed.    Marland Kitchen losartan (COZAAR) 50 MG tablet TAKE 1 TABLET (50 MG TOTAL) BY MOUTH DAILY. 90 tablet 1  . losartan (COZAAR) 50 MG tablet Take 1 tablet (50 mg total) by mouth daily. Please call and schedule a one year follow up appointment 90 tablet 0  . metFORMIN (GLUCOPHAGE-XR) 750 MG 24 hr  tablet TAKE 1 TABLET (750 MG TOTAL) BY MOUTH 2 (TWO) TIMES DAILY. 60 tablet 10  . metoprolol succinate (TOPROL-XL) 50 MG 24 hr tablet TAKE 1 TABLET BY MOUTH EVERY DAY 90 tablet 1  . metoprolol succinate (TOPROL-XL) 50 MG 24 hr tablet TAKE 1 TABLET BY MOUTH EVERY DAY 90 tablet 2  . nitroGLYCERIN (NITROSTAT) 0.4 MG SL tablet Place 1 tablet (0.4 mg total) under the tongue every 5 (five) minutes as needed for chest pain. 25 tablet 6  . Omega-3 Fatty Acids (FISH OIL) 1000 MG CAPS Take 1,000 mg by mouth daily.     Marland Kitchen omeprazole (PRILOSEC) 40 MG capsule TAKE 1 CAPSULE BY MOUTH DAILY. 90 capsule 0  . ONE TOUCH ULTRA TEST test strip USE 3 (THREE) TIMES DAILY. 100 each 2  . pravastatin (PRAVACHOL) 40 MG tablet TAKE 1 TABLET BY MOUTH DAILY. 90 tablet 3  . traZODone (DESYREL) 100 MG tablet Take 150 mg by mouth at bedtime as needed for sleep.     . traZODone (DESYREL) 100 MG tablet TAKE 2 TABLETS BY MOUTH AT BEDTIME AS NEEDED 60 tablet 3   No current facility-administered medications for this visit.     Allergies  Allergen Reactions  . Cephalexin Hives  . Keflin [Cephalothin]     rash  . Lamisil [Terbinafine Hcl]     Rash/itchy  . Latex Dermatitis  . Lisinopril     REACTION: cough  . Zolpidem Tartrate     UNKNOWN    History   Social History  . Marital Status: Married    Spouse Name: N/A    Number of Children: 3  . Years of Education: N/A   Occupational History  . Not on file.   Social History Main Topics  . Smoking status: Former Smoker    Quit date: 07/18/2008  . Smokeless tobacco: Not on file     Comment: No smoking since bypass in 4/10  . Alcohol Use: No       . Drug Use: No  . Sexually Active: Not on file   Other Topics Concern  . Not on file   Social History Narrative       Family History  Problem Relation Age of Onset  . Heart failure Mother   . Diabetes Mother   . Cancer Mother     ovarian?  . Heart disease Mother   . Congestive Heart Failure Mother     cause  of death  . Heart attack Father   . Heart attack Brother   . Diabetes Brother   . Heart attack Brother   . Diabetes Brother   . Heart attack Sister   . Diabetes Sister   . Cancer Maternal Aunt     died of breast  cancer  . Cancer Maternal Grandmother     died of bone cancer    Review of Systems:  As stated in the HPI and otherwise negative.   There were no vitals taken for this visit.  Physical Examination: General: Well developed, well nourished, NAD  HEENT: OP clear, mucus membranes moist  SKIN: warm, dry. No rashes. Neuro: No focal deficits  Musculoskeletal: Muscle strength 5/5 all ext  Psychiatric: Mood and affect normal  Neck: No JVD, no carotid bruits, no thyromegaly, no lymphadenopathy.  Lungs:Clear bilaterally, no wheezes, rhonci, crackles Cardiovascular: Regular rate and rhythm. No murmurs, gallops or rubs. Abdomen:Soft. Bowel sounds present. Non-tender.  Extremities: No lower extremity edema. Pulses are 2 + in the bilateral DP/PT.  EKG:  EKG is not ordered today. The ekg ordered today demonstrates   Recent Labs: 07/30/2015: ALT 41; BUN 20; Creatinine, Ser 1.15; Hemoglobin 14.9; Platelets 161.0; Potassium 4.7; Sodium 138   Lipid Panel    Component Value Date/Time   CHOL 115 07/30/2015 1321   CHOL 92 09/14/2012 1521   TRIG 225.0 (H) 07/30/2015 1321   TRIG 230 (H) 09/14/2012 1521   HDL 27.30 (L) 07/30/2015 1321   HDL 22 (L) 09/14/2012 1521   CHOLHDL 4 07/30/2015 1321   VLDL 45.0 (H) 07/30/2015 1321   VLDL 46 (H) 09/14/2012 1521   LDLCALC 43 07/24/2014 1030   LDLCALC 24 09/14/2012 1521   LDLDIRECT 63.0 07/30/2015 1321     Wt Readings from Last 3 Encounters:  07/30/15 74.4 kg (164 lb)  02/26/15 74.4 kg (164 lb)  01/28/15 75.3 kg (166 lb)     Other studies Reviewed: Additional studies/ records that were reviewed today include: . Review of the above records demonstrates:   Assessment and Plan:   1. CAD: Stable. No symptoms of unstable angina. Will  continue current therapy. BP controlled. He is on a statin. LDL at goal.   2. Tobacco abuse, in remission: He stopped smoking in 2010   3. HTN: BP controlled. No changes today.   Current medicines are reviewed at length with the patient today.  The patient does not have concerns regarding medicines.  The following changes have been made:  no change  Labs/ tests ordered today include:  No orders of the defined types were placed in this encounter.   Disposition:   FU with me in 6 months  Signed, [name], MD 01/07/2016 7:35 AM    [facility] [address], [place], [place]  [address] Phone: [phone]; Fax: [phone]

## 2016-01-09 ENCOUNTER — Ambulatory Visit (INDEPENDENT_AMBULATORY_CARE_PROVIDER_SITE_OTHER): Payer: BLUE CROSS/BLUE SHIELD | Admitting: Cardiovascular Disease

## 2016-01-09 ENCOUNTER — Encounter: Payer: Self-pay | Admitting: Cardiovascular Disease

## 2016-01-09 VITALS — BP 140/80 | HR 68 | Ht 69.0 in | Wt 163.4 lb

## 2016-01-09 DIAGNOSIS — I10 Essential (primary) hypertension: Secondary | ICD-10-CM | POA: Diagnosis not present

## 2016-01-09 DIAGNOSIS — I2510 Atherosclerotic heart disease of native coronary artery without angina pectoris: Secondary | ICD-10-CM | POA: Diagnosis not present

## 2016-01-09 MED ORDER — LOSARTAN POTASSIUM 50 MG PO TABS: 50.0000 mg | ORAL_TABLET | Freq: Every day | ORAL | 3 refills | Status: DC

## 2016-01-09 NOTE — Progress Notes (Signed)
Chief Complaint  Patient presents with  . Coronary Artery Disease    f/u    History of Present Illness: 60 yo WM with history of CAD s/p 4V CABG, borderline DM, GERD and nephrolithiasis admitted to [facility] 08/15/08 with an acute anterior STEMI. Emergent cath demonstrated a patent prior stent in the ostial LAD extending into the mid LAD. There was a hazy 99% stenosis in the mid LAD that was felt to be the culprit. PCI failed after multiple attempts, followed by ventricular fibrillation and the pt was taken emergently to the operating room for bypass. Emergent CABG was performed by Dr. Roxan Hockey with 4 vessels bypassed (LIMA to LAD, SVG to D2, SVG to OM2, SVG to distal RCA). After surgery, he had problems with chest wall pain and went to [facility] for evaluation by CT surgery. He underwent sternotomy with removal of sternal wires and revision by Dr. Lunette Stands on 03/28/09. Pre-operatively at [facility], he had a stress echo which showed segmental LV dysfunction with baseline EF of 40%, no stress induced ichemia. CTA showed 2/4 patent grafts (patent LIMA to LAD and patent SVG to OM. The SVG to RCA was occluded and the SVG to the Diagonal was occluded). He was admitted to [facility] 08/09/09 to 08/11/09 with chest pain. Cardiac cath repeated and showed patent LIMA to LAD, patent SVG to OM with occluded SVG to Diagonal and occluded SVG to RCA. EF was 45%. Exercise treadmill stress test August 2012 with no EKG changes with exercise. He was admitted to [facility] April 2014 with abdominal pain and found to have bowel perforation with partial colectomy and had a reversal 10/11/12. Exercise treadmill stress test June 22, 2013 with good exercise tolerance, no ischemic EKG changes. Diagnosed with diabetes spring 2015. Recent CT chest to follow pulmonary nodule which has resolved.   He is here today for follow up. He has been doing well. No chest pain or SOB. Energy level is good. He has been  exercising and has been walking 2 miles several days per week. Overall feels great.   Primary Care Physician: Viviana Simpler, MD  Past Medical History:  Diagnosis Date  . Arthritis   . CAD (coronary artery disease)    s/p emergent 4 V CABG (4/10) w 2/4 patent grafts by cath 08/11/09, patent LIMA to LAD & SVG to OM w moderate disease native RCA & occluded grafts to RCA and diagonal  . Chest pain    secondary to sternal non-union post CABg-now s/p sternotomy w repair 03/28/09 at [facility], dr. Lunette Stands  . GERD (gastroesophageal reflux disease)    with Schatzki ring  . History of chicken pox   . HTN (hypertension)   . 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 Nephrolithiasis   . Normal echocardiogram 5/10   EF 60, apical & apicallateral HK, PASP 22mmHg. Repeat at [facility] w reported ef of 40%.   . Type 2 diabetes mellitus, controlled ([place]) 6/15    Past Surgical History:  Procedure Laterality Date  . CORONARY ARTERY BYPASS GRAFT  08/16/2008   x4  . ESOPHAGEAL DILATION  ~2009   Dr Earlean Shawl  . ILEOSTOMY  2014   with reversal-- after diverticulitis. Sigmoid colectomy also. Dr Ely/Bird  . INGUINAL HERNIA REPAIR Left    Dr Ernst Spell  . pilonidal sinus tract excision    . STERNUM SEPARATION REPAIR  03/2009   [facility]  . TONSILLECTOMY    .  VASECTOMY      Current Outpatient Prescriptions  Medication Sig Dispense Refill  . ALPRAZolam (XANAX) 0.5 MG tablet Take 0.5 tablets (0.25 mg total) by mouth at bedtime as needed for anxiety or sleep. 90 tablet 0  . aspirin (ASPIR-81) 81 MG EC tablet Take 81 mg by mouth daily.      . cyclobenzaprine (FLEXERIL) 10 MG tablet Take 0.5-1 tablets (5-10 mg total) by mouth 3 (three) times daily as needed for muscle spasms. 30 tablet 1  . ibuprofen (ADVIL,MOTRIN) 200 MG tablet Take 400 mg by mouth 2 (two) times daily as needed (pain).     Marland Kitchen losartan (COZAAR) 50 MG  tablet Take 1 tablet (50 mg total) by mouth daily. 90 tablet 3  . metFORMIN (GLUCOPHAGE-XR) 750 MG 24 hr tablet TAKE 1 TABLET (750 MG TOTAL) BY MOUTH 2 (TWO) TIMES DAILY. 60 tablet 10  . metoprolol succinate (TOPROL-XL) 50 MG 24 hr tablet TAKE 1 TABLET BY MOUTH EVERY DAY 90 tablet 2  . nitroGLYCERIN (NITROSTAT) 0.4 MG SL tablet Place 1 tablet (0.4 mg total) under the tongue every 5 (five) minutes as needed for chest pain. 25 tablet 6  . Omega-3 Fatty Acids (FISH OIL) 1000 MG CAPS Take 1,000 mg by mouth daily.     Marland Kitchen omeprazole (PRILOSEC) 40 MG capsule TAKE 1 CAPSULE BY MOUTH DAILY. 90 capsule 0  . ONE TOUCH ULTRA TEST test strip USE 3 (THREE) TIMES DAILY. 100 each 2  . pravastatin (PRAVACHOL) 40 MG tablet TAKE 1 TABLET BY MOUTH DAILY. 90 tablet 3  . traZODone (DESYREL) 100 MG tablet Take 150 mg by mouth at bedtime as needed for sleep.     . traZODone (DESYREL) 100 MG tablet Take 100 mg by mouth at bedtime.     No current facility-administered medications for this visit.     Allergies  Allergen Reactions  . Zolpidem Other (See Comments)  . Cephalexin Hives  . Keflin [Cephalothin]     rash  . Latex Dermatitis  . Lisinopril Cough    REACTION: cough  . Zolpidem Tartrate     UNKNOWN  . Lamisil [Terbinafine Hcl] Rash    Rash/itchy    History   Social History  . Marital Status: Married    Spouse Name: N/A    Number of Children: 3  . Years of Education: N/A   Occupational History  . Not on file.   Social History Main Topics  . Smoking status: Former Smoker    Quit date: 07/18/2008  . Smokeless tobacco: Not on file     Comment: No smoking since bypass in 4/10  . Alcohol Use: No       . Drug Use: No  . Sexually Active: Not on file   Other Topics Concern  . Not on file   Social History Narrative       Family History  Problem Relation Age of Onset  . Heart failure Mother   . Diabetes Mother   . Cancer Mother     ovarian?  . Heart disease Mother   . Congestive Heart  Failure Mother     cause of death  . Heart attack Father   . Heart attack Brother   . Diabetes Brother   . Heart attack Brother   . Diabetes Brother   . Heart attack Sister   . Diabetes Sister   . Cancer Maternal Aunt     died of breast cancer  . Cancer Maternal Grandmother  died of bone cancer    Review of Systems:  As stated in the HPI and otherwise negative.   BP 140/80   Pulse 68   Ht 5\' 9"  (1.753 m)   Wt 74.1 kg (163 lb 6.4 oz)   BMI 24.13 kg/m   Physical Examination: General: Well developed, well nourished, NAD  HEENT: OP clear, mucus membranes moist  SKIN: warm, dry. No rashes. Neuro: No focal deficits  Musculoskeletal: Muscle strength 5/5 all ext  Psychiatric: Mood and affect normal  Neck: No JVD, no carotid bruits, no thyromegaly, no lymphadenopathy.  Lungs:Clear bilaterally, no wheezes, rhonci, crackles Cardiovascular: Regular rate and rhythm. No murmurs, gallops or rubs. Abdomen:Soft. Bowel sounds present. Non-tender.  Extremities: No lower extremity edema. Pulses are 2 + in the bilateral DP/PT.  EKG:  EKG is ordered today. The ekg ordered today demonstrates NSR, rate 68 bpm. ST abnormality inferior leads, unchanged. T wave flattening lateral leads, unchanged.  Recent Labs: 07/30/2015: ALT 41; BUN 20; Creatinine, Ser 1.15; Hemoglobin 14.9; Platelets 161.0; Potassium 4.7; Sodium 138   Lipid Panel    Component Value Date/Time   CHOL 115 07/30/2015 1321   CHOL 92 09/14/2012 1521   TRIG 225.0 (H) 07/30/2015 1321   TRIG 230 (H) 09/14/2012 1521   HDL 27.30 (L) 07/30/2015 1321   HDL 22 (L) 09/14/2012 1521   CHOLHDL 4 07/30/2015 1321   VLDL 45.0 (H) 07/30/2015 1321   VLDL 46 (H) 09/14/2012 1521   LDLCALC 43 07/24/2014 1030   LDLCALC 24 09/14/2012 1521   LDLDIRECT 63.0 07/30/2015 1321     Wt Readings from Last 3 Encounters:  01/09/16 74.1 kg (163 lb 6.4 oz)  07/30/15 74.4 kg (164 lb)  02/26/15 74.4 kg (164 lb)     Other studies  Reviewed: Additional studies/ records that were reviewed today include: . Review of the above records demonstrates:   Assessment and Plan:   1. CAD: He has no chest pain suggestive of angina. Will continue current therapy. He is on a statin. Lipids followed in primary care and controlled per pt.   2. Tobacco abuse, in remission: He stopped smoking in 2010   3. HTN: BP controlled. No changes today.   Current medicines are reviewed at length with the patient today.  The patient does not have concerns regarding medicines.  The following changes have been made:  no change  Labs/ tests ordered today include:   Orders Placed This Encounter  Procedures  . EKG 12-Lead    Disposition:   FU with me in 12 months  Signed, [name], MD 01/09/2016 4:33 PM    [facility] [address], [place], [place]  [address] Phone: [phone]; Fax: [phone]

## 2016-01-09 NOTE — Patient Instructions (Signed)

## 2016-01-15 ENCOUNTER — Other Ambulatory Visit: Payer: Self-pay

## 2016-01-15 MED ORDER — TRAZODONE HCL 100 MG PO TABS: 150.0000 mg | ORAL_TABLET | Freq: Every evening | ORAL | 3 refills | Status: DC | PRN

## 2016-01-15 NOTE — Telephone Encounter (Signed)
Last fill date unknown. Requesting #180. Not in our history. Last OV 07-30-15 Next OV 02-13-16.

## 2016-01-15 NOTE — Telephone Encounter (Signed)
What medication

## 2016-01-15 NOTE — Telephone Encounter (Signed)
Approved: okay x 1 year Trazodone 100mg  2 daily at bedtime

## 2016-01-15 NOTE — Telephone Encounter (Signed)
Sorry, Trazodone. It is on his med list as a historical entry.

## 2016-02-08 ENCOUNTER — Other Ambulatory Visit: Payer: Self-pay | Admitting: Internal Medicine

## 2016-02-13 ENCOUNTER — Encounter: Payer: BLUE CROSS/BLUE SHIELD | Admitting: Internal Medicine

## 2016-02-23 ENCOUNTER — Other Ambulatory Visit: Payer: Self-pay | Admitting: Internal Medicine

## 2016-02-23 ENCOUNTER — Encounter: Payer: BLUE CROSS/BLUE SHIELD | Admitting: Internal Medicine

## 2016-03-16 ENCOUNTER — Ambulatory Visit (INDEPENDENT_AMBULATORY_CARE_PROVIDER_SITE_OTHER): Payer: BLUE CROSS/BLUE SHIELD | Admitting: Internal Medicine

## 2016-03-16 ENCOUNTER — Encounter: Payer: Self-pay | Admitting: Internal Medicine

## 2016-03-16 VITALS — BP 116/78 | HR 57 | Temp 98.1°F | Ht 69.0 in | Wt 162.0 lb

## 2016-03-16 DIAGNOSIS — Z0000 Encounter for general adult medical examination without abnormal findings: Secondary | ICD-10-CM | POA: Diagnosis not present

## 2016-03-16 DIAGNOSIS — E119 Type 2 diabetes mellitus without complications: Secondary | ICD-10-CM

## 2016-03-16 DIAGNOSIS — G479 Sleep disorder, unspecified: Secondary | ICD-10-CM

## 2016-03-16 DIAGNOSIS — Z23 Encounter for immunization: Secondary | ICD-10-CM

## 2016-03-16 DIAGNOSIS — Z1159 Encounter for screening for other viral diseases: Secondary | ICD-10-CM

## 2016-03-16 DIAGNOSIS — I2510 Atherosclerotic heart disease of native coronary artery without angina pectoris: Secondary | ICD-10-CM

## 2016-03-16 LAB — HM DIABETES FOOT EXAM

## 2016-03-16 LAB — HEMOGLOBIN A1C: Hgb A1c MFr Bld: 5.2 % (ref 4.6–6.5)

## 2016-03-16 MED ORDER — METOPROLOL SUCCINATE ER 50 MG PO TB24: 50.0000 mg | ORAL_TABLET | Freq: Every day | ORAL | 3 refills | Status: DC

## 2016-03-16 MED ORDER — ALPRAZOLAM 0.5 MG PO TABS: 0.2500 mg | ORAL_TABLET | Freq: Every evening | ORAL | 0 refills | Status: DC | PRN

## 2016-03-16 MED ORDER — OMEPRAZOLE 40 MG PO CPDR: 40.0000 mg | DELAYED_RELEASE_CAPSULE | Freq: Every day | ORAL | 3 refills | Status: DC

## 2016-03-16 MED ORDER — METFORMIN HCL ER 750 MG PO TB24: ORAL_TABLET | ORAL | 3 refills | Status: DC

## 2016-03-16 NOTE — Progress Notes (Signed)
Subjective:    Patient ID: Joseph [name], male    DOB: 09/16/1955, 60 y.o.   MRN: [id number]  HPI Here for physical No new concerns other than a spot on his left arm Bluish and under skin--- tender at times Goes away with elevation--discussed this is just vein  Wonders about trying viagra or cialis Success at times--but often loses erection Never takes the nitro (has never taken it) Does regular exercise again-- walks briskly No chest pain or SOB No dizziness or syncope Not ready for Rx at this point  Checks sugars every other day or so 110-120 generally No hypoglycemic No sores, numbness or pain in feet   Current Outpatient Prescriptions on File Prior to Visit  Medication Sig Dispense Refill  . ALPRAZolam (XANAX) 0.5 MG tablet Take 0.5 tablets (0.25 mg total) by mouth at bedtime as needed for anxiety or sleep. 90 tablet 0  . aspirin (ASPIR-81) 81 MG EC tablet Take 81 mg by mouth daily.      . cyclobenzaprine (FLEXERIL) 10 MG tablet Take 0.5-1 tablets (5-10 mg total) by mouth 3 (three) times daily as needed for muscle spasms. 30 tablet 1  . ibuprofen (ADVIL,MOTRIN) 200 MG tablet Take 400 mg by mouth 2 (two) times daily as needed (pain).     Marland Kitchen losartan (COZAAR) 50 MG tablet Take 1 tablet (50 mg total) by mouth daily. 90 tablet 3  . metFORMIN (GLUCOPHAGE-XR) 750 MG 24 hr tablet TAKE 1 TABLET (750 MG TOTAL) BY MOUTH 2 (TWO) TIMES DAILY. 60 tablet 0  . metoprolol succinate (TOPROL-XL) 50 MG 24 hr tablet TAKE 1 TABLET BY MOUTH EVERY DAY 90 tablet 2  . nitroGLYCERIN (NITROSTAT) 0.4 MG SL tablet Place 1 tablet (0.4 mg total) under the tongue every 5 (five) minutes as needed for chest pain. 25 tablet 6  . Omega-3 Fatty Acids (FISH OIL) 1000 MG CAPS Take 1,000 mg by mouth daily.     Marland Kitchen omeprazole (PRILOSEC) 40 MG capsule TAKE 1 CAPSULE BY MOUTH DAILY. 90 capsule 0  . pravastatin (PRAVACHOL) 40 MG tablet TAKE 1 TABLET BY MOUTH DAILY. 90 tablet 3  . traZODone (DESYREL) 100 MG tablet Take  1.5 tablets (150 mg total) by mouth at bedtime as needed for sleep. 135 tablet 3   No current facility-administered medications on file prior to visit.     Allergies  Allergen Reactions  . Zolpidem Other (See Comments)  . Cephalexin Hives  . Keflin [Cephalothin]     rash  . Latex Dermatitis  . Lisinopril Cough    REACTION: cough  . Zolpidem Tartrate     UNKNOWN  . Lamisil [Terbinafine Hcl] Rash    Rash/itchy    Past Medical History:  Diagnosis Date  . Arthritis   . CAD (coronary artery disease)    s/p emergent 4 V CABG (4/10) w 2/4 patent grafts by cath 08/11/09, patent LIMA to LAD & SVG to OM w moderate disease native RCA & occluded grafts to RCA and diagonal  . Chest pain    secondary to sternal non-union post CABg-now s/p sternotomy w repair 03/28/09 at [facility], dr. Lunette Stands  . GERD (gastroesophageal reflux disease)    with Schatzki ring  . History of chicken pox   . HTN (hypertension)   . 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 Nephrolithiasis   . Normal echocardiogram 5/10   EF 60, apical & apicallateral HK, PASP 33mmHg. Repeat  at [facility] w reported ef of 40%.   . Type 2 diabetes mellitus, controlled ([place]) 6/15    Past Surgical History:  Procedure Laterality Date  . CORONARY ARTERY BYPASS GRAFT  08/16/2008   x4  . ESOPHAGEAL DILATION  ~2009   Dr Earlean Shawl  . ILEOSTOMY  2014   with reversal-- after diverticulitis. Sigmoid colectomy also. Dr Ely/Bird  . INGUINAL HERNIA REPAIR Left    Dr Ernst Spell  . pilonidal sinus tract excision    . STERNUM SEPARATION REPAIR  03/2009   [facility]  . TONSILLECTOMY    . VASECTOMY      Family History  Problem Relation Age of Onset  . Heart failure Mother   . Diabetes Mother   . Cancer Mother     ovarian?  . Heart disease Mother   . Congestive Heart Failure Mother     cause of death  . Heart attack Father   . Heart attack  Brother   . Diabetes Brother   . Heart attack Brother   . Diabetes Brother   . Heart attack Sister   . Diabetes Sister   . Cancer Maternal Aunt     died of breast cancer  . Cancer Maternal Grandmother     died of bone cancer    Social History   Social History  . Marital status: Married    Spouse name: N/A  . Number of children: 3  . Years of education: N/A   Occupational History  . Broadcaster/advertising sales     Disabled   Social History Main Topics  . Smoking status: Former Smoker    Quit date: 07/18/2008  . Smokeless tobacco: Never Used     Comment: No smoking since bypass in 4/10  . Alcohol use No     Comment: He does not drink alcohol  . Drug use: No  . Sexual activity: Not on file   Other Topics Concern  . Not on file   Social History Narrative   No living will   Wife is health care POA   Would accept resuscitation   Not sure about tube feeds--probably not prolonged   Review of Systems  Constitutional: Negative for fatigue and unexpected weight change.       Wears seat belt  HENT: Negative for dental problem, hearing loss, tinnitus and trouble swallowing.        Overdue for dentist  Eyes: Negative for visual disturbance.       No diplopia or unilateral vision loss Early cataracts  Respiratory: Negative for cough, chest tightness and shortness of breath.   Cardiovascular: Negative for chest pain, palpitations and leg swelling.  Gastrointestinal: Negative for blood in stool and constipation.       Heartburn controlled with omeprazole  Endocrine: Negative for polydipsia and polyuria.  Genitourinary: Negative for difficulty urinating and urgency.  Musculoskeletal: Positive for arthralgias and back pain.       Uses ibuprofen about twice a week--discussed trying tylenol instead  Skin:       Touch of rosacea--- uses OTC hydrocortisone  Allergic/Immunologic: Negative for environmental allergies and immunocompromised state.  Neurological: Positive for  headaches. Negative for dizziness, syncope and light-headedness.  Hematological: Negative for adenopathy. Does not bruise/bleed easily.  Psychiatric/Behavioral: Negative for sleep disturbance.       Takes 1/4 alprazolam at bedtime and the trazodone---does okay with that Rough year emotionally-- no major mood problems (finances finally better with disability decision--partially favorable)       Objective:  Physical Exam  Constitutional: He is oriented to person, place, and time. He appears well-developed and well-nourished. No distress.  HENT:  Head: Normocephalic and atraumatic.  Right Ear: External ear normal.  Left Ear: External ear normal.  Mouth/Throat: Oropharynx is clear and moist. No oropharyngeal exudate.  Eyes: Conjunctivae are normal. Pupils are equal, round, and reactive to light.  Neck: Normal range of motion. Neck supple. No thyromegaly present.  Cardiovascular: Normal rate, regular rhythm, normal heart sounds and intact distal pulses.  Exam reveals no gallop.   No murmur heard. Pulmonary/Chest: Effort normal and breath sounds normal. No respiratory distress. He has no wheezes. He has no rales.  Abdominal: Soft. There is no tenderness.  Musculoskeletal: He exhibits no edema or tenderness.  Lymphadenopathy:    He has no cervical adenopathy.  Neurological: He is alert and oriented to person, place, and time.  Skin: No rash noted. No erythema.  Psychiatric: He has a normal mood and affect. His behavior is normal.          Assessment & Plan:

## 2016-03-16 NOTE — Assessment & Plan Note (Signed)
Will update Tdap and give pneumovax Will get last colon report--due 2020 Defer PSA--less than 2 years ago Discussed fitness

## 2016-03-16 NOTE — Progress Notes (Signed)
Pre visit review using our clinic review tool, if applicable. No additional management support is needed unless otherwise documented below in the visit note. 

## 2016-03-16 NOTE — Assessment & Plan Note (Signed)
No angina and doesn't use nitro Would consider sildenafil if he asks for it

## 2016-03-16 NOTE — Addendum Note (Signed)
Addended by: Pilar Grammes on: 03/16/2016 12:12 PM   Modules accepted: Orders

## 2016-03-16 NOTE — Assessment & Plan Note (Signed)
Doing okay on meds

## 2016-03-16 NOTE — Assessment & Plan Note (Signed)
Still seems to have good control Will check labs 

## 2016-03-17 LAB — HEPATITIS C ANTIBODY: HCV Ab: NEGATIVE

## 2016-03-25 ENCOUNTER — Encounter: Payer: Self-pay | Admitting: Internal Medicine

## 2016-07-02 ENCOUNTER — Encounter: Payer: Self-pay | Admitting: Internal Medicine

## 2016-07-02 LAB — HM DIABETES EYE EXAM

## 2016-07-06 LAB — HM DIABETES EYE EXAM

## 2016-07-13 ENCOUNTER — Encounter: Payer: Self-pay | Admitting: Internal Medicine

## 2016-08-09 LAB — HM DIABETES EYE EXAM

## 2016-08-13 NOTE — Discharge Instructions (Signed)
Cataract Surgery, Care After °Refer to this sheet in the next few weeks. These instructions provide you with information about caring for yourself after your procedure. Your health care provider may also give you more specific instructions. Your treatment has been planned according to current medical practices, but problems sometimes occur. Call your health care provider if you have any problems or questions after your procedure. °What can I expect after the procedure? °After the procedure, it is common to have: °· Itching. °· Discomfort. °· Fluid discharge. °· Sensitivity to light and to touch. °· Bruising. °Follow these instructions at home: °Eye Care  °· Check your eye every day for signs of infection. Watch for: °¨ Redness, swelling, or pain. °¨ Fluid, blood, or pus. °¨ Warmth. °¨ Bad smell. °Activity  °· Avoid strenuous activities, such as playing contact sports, for as long as told by your health care provider. °· Do not drive or operate heavy machinery until your health care provider approves. °· Do not bend or lift heavy objects . Bending increases pressure in the eye. You can walk, climb stairs, and do light household chores. °· Ask your health care provider when you can return to work. If you work in a dusty environment, you may be advised to wear protective eyewear for a period of time. °General instructions  °· Take or apply over-the-counter and prescription medicines only as told by your health care provider. This includes eye drops. °· Do not touch or rub your eyes. °· If you were given a protective shield, wear it as told by your health care provider. If you were not given a protective shield, wear sunglasses as told by your health care provider to protect your eyes. °· Keep the area around your eye clean and dry. Avoid swimming or allowing water to hit you directly in the face while showering until told by your health care provider. Keep soap and shampoo out of your eyes. °· Do not put a contact lens  into the affected eye or eyes until your health care provider approves. °· Keep all follow-up visits as told by your health care provider. This is important. °Contact a health care provider if: ° °· You have increased bruising around your eye. °· You have pain that is not helped with medicine. °· You have a fever. °· You have redness, swelling, or pain in your eye. °· You have fluid, blood, or pus coming from your incision. °· Your vision gets worse. °Get help right away if: °· You have sudden vision loss. °This information is not intended to replace advice given to you by your health care provider. Make sure you discuss any questions you have with your health care provider. °Document Released: 10/23/2004 Document Revised: 08/14/2015 Document Reviewed: 02/13/2015 °Elsevier Interactive Patient Education © 2017 [employer] ° ° ° ° °General Anesthesia, Adult, Care After °These instructions provide you with information about caring for yourself after your procedure. Your health care provider may also give you more specific instructions. Your treatment has been planned according to current medical practices, but problems sometimes occur. Call your health care provider if you have any problems or questions after your procedure. °What can I expect after the procedure? °After the procedure, it is common to have: °· Vomiting. °· A sore throat. °· Mental slowness. °It is common to feel: °· Nauseous. °· Cold or shivery. °· Sleepy. °· Tired. °· Sore or achy, even in parts of your body where you did not have surgery. °Follow these instructions at   home: °For at least 24 hours after the procedure:  °· Do not: °¨ Participate in activities where you could fall or become injured. °¨ Drive. °¨ Use heavy machinery. °¨ Drink alcohol. °¨ Take sleeping pills or medicines that cause drowsiness. °¨ Make important decisions or sign legal documents. °¨ Take care of children on your own. °· Rest. °Eating and drinking  °· If you vomit, drink  water, juice, or soup when you can drink without vomiting. °· Drink enough fluid to keep your urine clear or pale yellow. °· Make sure you have little or no nausea before eating solid foods. °· Follow the diet recommended by your health care provider. °General instructions  °· Have a responsible adult stay with you until you are awake and alert. °· Return to your normal activities as told by your health care provider. Ask your health care provider what activities are safe for you. °· Take over-the-counter and prescription medicines only as told by your health care provider. °· If you smoke, do not smoke without supervision. °· Keep all follow-up visits as told by your health care provider. This is important. °Contact a health care provider if: °· You continue to have nausea or vomiting at home, and medicines are not helpful. °· You cannot drink fluids or start eating again. °· You cannot urinate after 8-12 hours. °· You develop a skin rash. °· You have fever. °· You have increasing redness at the site of your procedure. °Get help right away if: °· You have difficulty breathing. °· You have chest pain. °· You have unexpected bleeding. °· You feel that you are having a life-threatening or urgent problem. °This information is not intended to replace advice given to you by your health care provider. Make sure you discuss any questions you have with your health care provider. °Document Released: 07/12/2000 Document Revised: 09/08/2015 Document Reviewed: 03/20/2015 °Elsevier Interactive Patient Education © 2017 [employer] Inc. ° °

## 2016-08-18 ENCOUNTER — Ambulatory Visit: Payer: BLUE CROSS/BLUE SHIELD | Admitting: Anesthesiology

## 2016-08-18 ENCOUNTER — Encounter
Admission: RE | Disposition: A | Discharge status: Home / Self Care | Payer: Self-pay | Source: Ambulatory Visit | Attending: Ophthalmology

## 2016-08-18 ENCOUNTER — Ambulatory Visit
Admission: RE | Admit: 2016-08-18 | Discharge: 2016-08-18 | Disposition: A | Discharge status: Home / Self Care | Payer: BLUE CROSS/BLUE SHIELD | Source: Ambulatory Visit | Attending: Ophthalmology | Admitting: Ophthalmology

## 2016-08-18 DIAGNOSIS — I252 Old myocardial infarction: Secondary | ICD-10-CM | POA: Insufficient documentation

## 2016-08-18 DIAGNOSIS — Z87891 Personal history of nicotine dependence: Secondary | ICD-10-CM | POA: Insufficient documentation

## 2016-08-18 DIAGNOSIS — K219 Gastro-esophageal reflux disease without esophagitis: Secondary | ICD-10-CM | POA: Diagnosis not present

## 2016-08-18 DIAGNOSIS — E1136 Type 2 diabetes mellitus with diabetic cataract: Secondary | ICD-10-CM | POA: Diagnosis not present

## 2016-08-18 DIAGNOSIS — R51 Headache: Secondary | ICD-10-CM | POA: Diagnosis not present

## 2016-08-18 DIAGNOSIS — I10 Essential (primary) hypertension: Secondary | ICD-10-CM | POA: Insufficient documentation

## 2016-08-18 DIAGNOSIS — Z951 Presence of aortocoronary bypass graft: Secondary | ICD-10-CM | POA: Diagnosis not present

## 2016-08-18 DIAGNOSIS — Z7984 Long term (current) use of oral hypoglycemic drugs: Secondary | ICD-10-CM | POA: Diagnosis not present

## 2016-08-18 DIAGNOSIS — I2510 Atherosclerotic heart disease of native coronary artery without angina pectoris: Secondary | ICD-10-CM | POA: Diagnosis not present

## 2016-08-18 HISTORY — DX: Diverticulitis of intestine, part unspecified, without perforation or abscess without bleeding: K57.92

## 2016-08-18 HISTORY — DX: Cervicalgia: M54.2

## 2016-08-18 HISTORY — DX: Acute myocardial infarction, unspecified: I21.9

## 2016-08-18 HISTORY — DX: Pure hypercholesterolemia, unspecified: E78.00

## 2016-08-18 HISTORY — DX: Pneumonia, unspecified organism: J18.9

## 2016-08-18 HISTORY — PX: CATARACT EXTRACTION W/PHACO: SHX586

## 2016-08-18 HISTORY — DX: Headache: R51

## 2016-08-18 HISTORY — DX: Restless legs syndrome: G25.81

## 2016-08-18 HISTORY — DX: Fatty (change of) liver, not elsewhere classified: K76.0

## 2016-08-18 LAB — GLUCOSE, CAPILLARY
Glucose-Capillary: 114 mg/dL — ABNORMAL HIGH (ref 65–99)
Glucose-Capillary: 118 mg/dL — ABNORMAL HIGH (ref 65–99)

## 2016-08-18 SURGERY — PHACOEMULSIFICATION, CATARACT, WITH IOL INSERTION
Anesthesia: Monitor Anesthesia Care | Site: Eye | Laterality: Right | Wound class: Clean

## 2016-08-18 MED ORDER — NA HYALUR & NA CHOND-NA HYALUR 0.4-0.35 ML IO KIT
PACK | INTRAOCULAR | Status: DC | PRN
  Administered 2016-08-18: 1 mL via INTRAOCULAR

## 2016-08-18 MED ORDER — ARMC OPHTHALMIC DILATING DROPS
1.0000 "application " | OPHTHALMIC | Status: DC | PRN
  Administered 2016-08-18 (×3): 1 via OPHTHALMIC

## 2016-08-18 MED ORDER — MOXIFLOXACIN HCL 0.5 % OP SOLN
1.0000 [drp] | OPHTHALMIC | Status: DC | PRN
  Administered 2016-08-18 (×3): 1 [drp] via OPHTHALMIC

## 2016-08-18 MED ORDER — ONDANSETRON HCL 4 MG/2ML IJ SOLN: 4.0000 mg | Freq: Once | INTRAMUSCULAR | Status: DC | PRN

## 2016-08-18 MED ORDER — MIDAZOLAM HCL 2 MG/2ML IJ SOLN
INTRAMUSCULAR | Status: DC | PRN
  Administered 2016-08-18: 2 mg via INTRAVENOUS

## 2016-08-18 MED ORDER — MOXIFLOXACIN HCL 0.5 % OP SOLN
OPHTHALMIC | Status: DC | PRN
  Administered 2016-08-18: .3 mL via OPHTHALMIC

## 2016-08-18 MED ORDER — ACETAMINOPHEN 325 MG PO TABS: 325.0000 mg | ORAL_TABLET | ORAL | Status: DC | PRN

## 2016-08-18 MED ORDER — ACETAMINOPHEN 160 MG/5ML PO SOLN: 325.0000 mg | ORAL | Status: DC | PRN

## 2016-08-18 MED ORDER — FENTANYL CITRATE (PF) 100 MCG/2ML IJ SOLN
INTRAMUSCULAR | Status: DC | PRN
  Administered 2016-08-18 (×2): 50 ug via INTRAVENOUS

## 2016-08-18 MED ORDER — BRIMONIDINE TARTRATE-TIMOLOL 0.2-0.5 % OP SOLN
OPHTHALMIC | Status: DC | PRN
  Administered 2016-08-18: 1 [drp] via OPHTHALMIC

## 2016-08-18 MED ORDER — BALANCED SALT IO SOLN
INTRAOCULAR | Status: DC | PRN
  Administered 2016-08-18: 1 mL via OPHTHALMIC

## 2016-08-18 MED ORDER — EPINEPHRINE PF 1 MG/ML IJ SOLN
INTRAOCULAR | Status: DC | PRN
  Administered 2016-08-18: 59 mL via OPHTHALMIC

## 2016-08-18 SURGICAL SUPPLY — 25 items
CANNULA ANT/CHMB 27GA (MISCELLANEOUS) ×2 IMPLANT
CARTRIDGE ABBOTT (MISCELLANEOUS) IMPLANT
GLOVE SURG LX 7.5 STRW (GLOVE) ×1
GLOVE SURG LX STRL 7.5 STRW (GLOVE) ×1 IMPLANT
GLOVE SURG TRIUMPH 8.0 PF LTX (GLOVE) ×2 IMPLANT
GOWN STRL REUS W/ TWL LRG LVL3 (GOWN DISPOSABLE) ×2 IMPLANT
GOWN STRL REUS W/TWL LRG LVL3 (GOWN DISPOSABLE) ×2
LENS IOL TECNIS ITEC 18.5 (Intraocular Lens) ×2 IMPLANT
MARKER SKIN DUAL TIP RULER LAB (MISCELLANEOUS) ×2 IMPLANT
NDL RETROBULBAR .5 NSTRL (NEEDLE) IMPLANT
NEEDLE FILTER BLUNT 18X 1/2SAF (NEEDLE) ×1
NEEDLE FILTER BLUNT 18X1 1/2 (NEEDLE) ×1 IMPLANT
PACK CATARACT BRASINGTON (MISCELLANEOUS) ×2 IMPLANT
PACK EYE AFTER SURG (MISCELLANEOUS) ×2 IMPLANT
PACK OPTHALMIC (MISCELLANEOUS) ×2 IMPLANT
RING MALYGIN 7.0 (MISCELLANEOUS) IMPLANT
SUT ETHILON 10-0 CS-B-6CS-B-6 (SUTURE)
SUT VICRYL  9 0 (SUTURE)
SUT VICRYL 9 0 (SUTURE) IMPLANT
SUTURE EHLN 10-0 CS-B-6CS-B-6 (SUTURE) IMPLANT
SYR 3ML LL SCALE MARK (SYRINGE) ×2 IMPLANT
SYR 5ML LL (SYRINGE) ×2 IMPLANT
SYR TB 1ML LUER SLIP (SYRINGE) ×2 IMPLANT
WATER STERILE IRR 250ML POUR (IV SOLUTION) ×2 IMPLANT
WIPE NON LINTING 3.25X3.25 (MISCELLANEOUS) ×2 IMPLANT

## 2016-08-18 NOTE — Op Note (Signed)
LOCATION:  [place]   PREOPERATIVE DIAGNOSIS:    Nuclear sclerotic cataract right eye. H25.11   POSTOPERATIVE DIAGNOSIS:  Nuclear sclerotic cataract right eye.     PROCEDURE:  Phacoemusification with posterior chamber intraocular lens placement of the right eye   LENS:   Implant Name Type Inv. Item Serial No. Manufacturer Lot No. LRB No. Used  LENS IOL DIOP 18.5 - [id number] Intraocular Lens LENS IOL DIOP 18.5 [id number]   Right 1        ULTRASOUND TIME: 16 % of 1 minutes, 0 seconds.  CDE 9.6   SURGEON:  Wyonia Hough, MD   ANESTHESIA:  Topical with tetracaine drops and 2% Xylocaine jelly, augmented with 1% preservative-free intracameral lidocaine.    COMPLICATIONS:  None.   DESCRIPTION OF PROCEDURE:  The patient was identified in the holding room and transported to the operating room and placed in the supine position under the operating microscope.  The right eye was identified as the operative eye and it was prepped and draped in the usual sterile ophthalmic fashion.   A 1 millimeter clear-corneal paracentesis was made at the 12:00 position.  0.5 ml of preservative-free 1% lidocaine was injected into the anterior chamber. The anterior chamber was filled with Viscoat viscoelastic.  A 2.4 millimeter keratome was used to make a near-clear corneal incision at the 9:00 position.  A curvilinear capsulorrhexis was made with a cystotome and capsulorrhexis forceps.  Balanced salt solution was used to hydrodissect and hydrodelineate the nucleus.   Phacoemulsification was then used in stop and chop fashion to remove the lens nucleus and epinucleus.  The remaining cortex was then removed using the irrigation and aspiration handpiece. Provisc was then placed into the capsular bag to distend it for lens placement.  A lens was then injected into the capsular bag.  The remaining viscoelastic was aspirated.   Wounds were hydrated with balanced salt solution.  The anterior  chamber was inflated to a physiologic pressure with balanced salt solution.  No wound leaks were noted. Vigamox 0.2 ml of a 1mg  per ml solution was injected into the anterior chamber for a dose of 0.2 mg of intracameral antibiotic at the completion of the case.   Timolol and Brimonidine drops were applied to the eye.  The patient was taken to the recovery room in stable condition without complications of anesthesia or surgery.   Leylah Tarnow 08/18/2016, 8:04 AM

## 2016-08-18 NOTE — Anesthesia Postprocedure Evaluation (Signed)
Anesthesia Post Note  Patient: Joseph [name]  Procedure(s) Performed: Procedure(s) (LRB): CATARACT EXTRACTION PHACO AND INTRAOCULAR LENS PLACEMENT (IOC) Right diabetic (Right)  Patient location during evaluation: PACU Anesthesia Type: MAC Level of consciousness: awake and alert Pain management: pain level controlled Vital Signs Assessment: post-procedure vital signs reviewed and stable Respiratory status: spontaneous breathing, nonlabored ventilation and respiratory function stable Cardiovascular status: stable Postop Assessment: no signs of nausea or vomiting Anesthetic complications: no    Veda Canning

## 2016-08-18 NOTE — Anesthesia Preprocedure Evaluation (Addendum)
Anesthesia Evaluation  Patient identified by MRN, date of birth, ID band Patient awake    Reviewed: Allergy & Precautions, NPO status   Airway Mallampati: II  TM Distance: >3 FB     Dental no notable dental hx.    Pulmonary former smoker,    breath sounds clear to auscultation       Cardiovascular hypertension, + CAD and + Past MI   Rhythm:regular Rate:Normal  s/p emergent 4 V CABG (4/10) w 2/4 patent grafts by cath 08/11/09, patent LIMA to LAD & SVG to OM w moderate disease native RCA & occluded grafts to RCA and diagonal   Neuro/Psych  Headaches,    GI/Hepatic GERD  ,  Endo/Other  diabetes, Type 2, Oral Hypoglycemic Agents  Renal/GU      Musculoskeletal   Abdominal   Peds  Hematology   Anesthesia Other Findings   Reproductive/Obstetrics                             Anesthesia Physical  Anesthesia Plan  ASA: III  Anesthesia Plan: MAC   Post-op Pain Management:    Induction:   Airway Management Planned:   Additional Equipment:   Intra-op Plan:   Post-operative Plan:   Informed Consent: I have reviewed the patients History and Physical, chart, labs and discussed the procedure including the risks, benefits and alternatives for the proposed anesthesia with the patient or authorized representative who has indicated his/her understanding and acceptance.     Plan Discussed with:   Anesthesia Plan Comments:         Anesthesia Quick Evaluation  

## 2016-08-18 NOTE — H&P (Signed)
The History and Physical notes are on paper, have been signed, and are to be scanned. The patient remains stable and unchanged from the H&P.   Previous H&P reviewed, patient examined, and there are no changes.  Joseph [name] 08/18/2016 7:45 AM

## 2016-08-18 NOTE — Anesthesia Procedure Notes (Signed)
Procedure Name: MAC Performed by: Jamesina Gaugh Pre-anesthesia Checklist: Patient identified, Emergency Drugs available, Suction available, Timeout performed and Patient being monitored Patient Re-evaluated:Patient Re-evaluated prior to inductionOxygen Delivery Method: Nasal cannula Placement Confirmation: positive ETCO2     

## 2016-08-18 NOTE — Transfer of Care (Signed)
Immediate Anesthesia Transfer of Care Note  Patient: Joseph [name]  Procedure(s) Performed: Procedure(s) with comments: CATARACT EXTRACTION PHACO AND INTRAOCULAR LENS PLACEMENT (IOC) Right diabetic (Right) - Diabetic-oral meds poss Latex allergy  Patient Location: PACU  Anesthesia Type: MAC  Level of Consciousness: awake, alert  and patient cooperative  Airway and Oxygen Therapy: Patient Spontanous Breathing and Patient connected to supplemental oxygen  Post-op Assessment: Post-op Vital signs reviewed, Patient's Cardiovascular Status Stable, Respiratory Function Stable, Patent Airway and No signs of Nausea or vomiting  Post-op Vital Signs: Reviewed and stable  Complications: No apparent anesthesia complications

## 2016-08-19 ENCOUNTER — Other Ambulatory Visit: Payer: Self-pay | Admitting: Cardiovascular Disease

## 2016-08-19 ENCOUNTER — Encounter: Payer: Self-pay | Admitting: Ophthalmology

## 2016-08-19 LAB — HM DIABETES EYE EXAM

## 2016-08-19 NOTE — Telephone Encounter (Signed)
Medication Detail    Disp Refills Start End   losartan (COZAAR) 50 MG tablet 90 tablet 3 01/09/2016    Sig - Route: Take 1 tablet (50 mg total) by mouth daily. - Oral   Patient taking differently: Take 50 mg by mouth daily. am       E-Prescribing Status: Receipt confirmed by pharmacy (01/09/2016 4:24 PM EDT)   Pharmacy   [id number] - [place], [place]

## 2016-08-26 LAB — HM DIABETES EYE EXAM

## 2016-09-06 ENCOUNTER — Other Ambulatory Visit: Payer: Self-pay | Admitting: Internal Medicine

## 2016-09-06 ENCOUNTER — Encounter: Payer: Self-pay | Admitting: *Deleted

## 2016-09-06 NOTE — Telephone Encounter (Signed)
Px written for call in   In PCP absence

## 2016-09-06 NOTE — Telephone Encounter (Signed)
Last filled 06-13-16 #90 Last OV 03-16-16 Next OV 09-14-16  Forward to Dr Glori Bickers in Dr Alla German absence. Please give back to me when approved/denied. Thanks

## 2016-09-06 NOTE — Telephone Encounter (Signed)
Rx called in to requested pharmacy 

## 2016-09-14 ENCOUNTER — Ambulatory Visit (INDEPENDENT_AMBULATORY_CARE_PROVIDER_SITE_OTHER): Payer: BLUE CROSS/BLUE SHIELD | Admitting: Internal Medicine

## 2016-09-14 ENCOUNTER — Encounter: Payer: Self-pay | Admitting: Internal Medicine

## 2016-09-14 VITALS — BP 122/78 | HR 63 | Temp 98.2°F | Wt 160.0 lb

## 2016-09-14 DIAGNOSIS — G479 Sleep disorder, unspecified: Secondary | ICD-10-CM

## 2016-09-14 DIAGNOSIS — I2510 Atherosclerotic heart disease of native coronary artery without angina pectoris: Secondary | ICD-10-CM | POA: Diagnosis not present

## 2016-09-14 DIAGNOSIS — I10 Essential (primary) hypertension: Secondary | ICD-10-CM | POA: Diagnosis not present

## 2016-09-14 DIAGNOSIS — E119 Type 2 diabetes mellitus without complications: Secondary | ICD-10-CM

## 2016-09-14 MED ORDER — ALPRAZOLAM 0.5 MG PO TABS: 0.2500 mg | ORAL_TABLET | Freq: Every day | ORAL | 0 refills | Status: DC

## 2016-09-14 NOTE — Assessment & Plan Note (Signed)
Does okay with alprazolam and trazodone

## 2016-09-14 NOTE — Progress Notes (Signed)
Subjective:    Patient ID: Joseph [name], male    DOB: 1955-12-14, 61 y.o.   MRN: [id number]  HPI Here for follow up of diabetes and other chronic health conditions  Feels good Still has some trouble sleeping --uses the alprazolam but occasionally needs a second dose Needs more than #45 for 3 months  Checks sugars regularly-- once or twice a week More variability--- as low as 72 (and can tell it is low--tired, weak and dizzy) and will go up as high as 160 (after watermelon) No numbness, pain or sores in feet  No chest pain No SOB No dizziness or syncope No edema  No problems with statin No GI problems or myalgias Current Outpatient Prescriptions on File Prior to Visit  Medication Sig Dispense Refill  . aspirin (ASPIR-81) 81 MG EC tablet Take 81 mg by mouth daily. pm    . cyclobenzaprine (FLEXERIL) 10 MG tablet Take 0.5-1 tablets (5-10 mg total) by mouth 3 (three) times daily as needed for muscle spasms. 30 tablet 1  . ibuprofen (ADVIL,MOTRIN) 200 MG tablet Take 400 mg by mouth 2 (two) times daily as needed (pain).     Marland Kitchen ketoconazole (NIZORAL) 2 % cream Apply 1 application topically daily.    Marland Kitchen losartan (COZAAR) 50 MG tablet Take 1 tablet (50 mg total) by mouth daily. (Patient taking differently: Take 50 mg by mouth daily. am) 90 tablet 3  . metFORMIN (GLUCOPHAGE-XR) 750 MG 24 hr tablet TAKE 1 TABLET (750 MG TOTAL) BY MOUTH 2 (TWO) TIMES DAILY. (Patient taking differently: 750 mg. TAKE 1 TABLET (750 MG TOTAL) BY MOUTH 2 (TWO) TIMES DAILY.) 180 tablet 3  . metoprolol succinate (TOPROL-XL) 50 MG 24 hr tablet Take 1 tablet (50 mg total) by mouth daily. Take with or immediately following a meal. (Patient taking differently: Take 50 mg by mouth daily. Take with or immediately following a meal./am) 90 tablet 3  . metroNIDAZOLE (METROCREAM) 0.75 % cream Apply 1 application topically daily.    . nitroGLYCERIN (NITROSTAT) 0.4 MG SL tablet Place 1 tablet (0.4 mg total) under the tongue every  5 (five) minutes as needed for chest pain. 25 tablet 6  . Omega-3 Fatty Acids (FISH OIL) 1000 MG CAPS Take 1,000 mg by mouth daily.     Marland Kitchen omeprazole (PRILOSEC) 40 MG capsule Take 1 capsule (40 mg total) by mouth daily. (Patient taking differently: Take 40 mg by mouth daily. am) 90 capsule 3  . pravastatin (PRAVACHOL) 40 MG tablet TAKE 1 TABLET BY MOUTH DAILY. (Patient taking differently: TAKE 1 TABLET BY MOUTH DAILY./am) 90 tablet 3  . traZODone (DESYREL) 100 MG tablet Take 1.5 tablets (150 mg total) by mouth at bedtime as needed for sleep. (Patient taking differently: Take 100 mg by mouth at bedtime as needed for sleep. ) 135 tablet 3   No current facility-administered medications on file prior to visit.     Allergies  Allergen Reactions  . Zolpidem Other (See Comments)    hallucinate  . Cephalexin Hives  . Keflin [Cephalothin]     rash  . Latex Dermatitis    Bandages and adhesives/ poss to latex  . Lisinopril Cough    REACTION: cough  . Zolpidem Tartrate     UNKNOWN  . Lamisil [Terbinafine Hcl] Rash    Rash/itchy    Past Medical History:  Diagnosis Date  . Arthritis    knees and hip  . CAD (coronary artery disease)    s/p emergent 4 V  CABG (4/10) w 2/4 patent grafts by cath 08/11/09, patent LIMA to LAD & SVG to OM w moderate disease native RCA & occluded grafts to RCA and diagonal  . Chest pain    secondary to sternal non-union post CABg-now s/p sternotomy w repair 03/28/09 at [facility], dr. Lunette Stands  . Diverticulitis    with perforated colon  . Fatty liver    non-alcoholic  . GERD (gastroesophageal reflux disease)    with Schatzki ring  . Headache    2x month  . History of chicken pox   . HTN (hypertension)    controlled  . Hypercholesterolemia   . 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 Myocardial infarction ([place]) 3220,2542  . Neck pain    stiffness  .  Nephrolithiasis   . Normal echocardiogram 5/10   EF 60, apical & apicallateral HK, PASP 77mmHg. Repeat at [facility] w reported ef of 40%.   . Pneumonia 2016   in past   . Restless leg syndrome   . Type 2 diabetes mellitus, controlled ([place]) 6/15   type 2    Past Surgical History:  Procedure Laterality Date  . CATARACT EXTRACTION W/PHACO Right 08/18/2016   Procedure: CATARACT EXTRACTION PHACO AND INTRAOCULAR LENS PLACEMENT ([place]) Right diabetic;  Surgeon: Leandrew Koyanagi, MD;  Location: [place];  Service: Ophthalmology;  Laterality: Right;  Diabetic-oral meds poss Latex allergy  . CORONARY ANGIOPLASTY  2003   stentx2  . CORONARY ARTERY BYPASS GRAFT  08/16/2008   x4  . ESOPHAGEAL DILATION  ~2009   Dr Earlean Shawl  . ILEOSTOMY  2014   with reversal-- after diverticulitis. Sigmoid colectomy also. Dr Ely/Bird  . INGUINAL HERNIA REPAIR Left x2   Dr Ernst Spell  . pilonidal sinus tract excision    . STERNUM SEPARATION REPAIR  03/2009   [place]  . VASECTOMY      Family History  Problem Relation Age of Onset  . Heart failure Mother   . Diabetes Mother   . Cancer Mother        ovarian?  . Heart disease Mother   . Congestive Heart Failure Mother        cause of death  . Heart attack Father   . Heart attack Brother   . Diabetes Brother   . Heart attack Brother   . Diabetes Brother   . Heart attack Sister   . Diabetes Sister   . Cancer Maternal Aunt        died of breast cancer  . Cancer Maternal Grandmother        died of bone cancer    Social History   Social History  . Marital status: Married    Spouse name: N/A  . Number of children: 3  . Years of education: N/A   Occupational History  . Broadcaster/advertising sales     Disabled   Social History Main Topics  . Smoking status: Former Smoker    Packs/day: 0.50    Years: 37.00    Types: Cigarettes    Quit date: 07/18/2008  . Smokeless tobacco: Never Used     Comment: No smoking since  bypass in 4/10  . Alcohol use 0.0 oz/week     Comment: rarely  . Drug use: No  . Sexual activity: Not on file   Other Topics Concern  . Not on file   Social History Narrative   No living will  Wife is health care POA   Would accept resuscitation   Not sure about tube feeds--probably not prolonged   Review of Systems Had cataract done recently and the other tomorrow No issues with the ED--no living apart from wife though they have relationship still Sleeps okay with the med Appetite is fine Weight stable    Objective:   Physical Exam  Constitutional: He appears well-nourished. No distress.  Neck: No thyromegaly present.  Cardiovascular: Normal rate, regular rhythm, normal heart sounds and intact distal pulses.  Exam reveals no gallop.   No murmur heard. Pulmonary/Chest: Effort normal and breath sounds normal. No respiratory distress. He has no wheezes. He has no rales.  Abdominal: Soft. There is no tenderness.  Musculoskeletal: He exhibits no edema or tenderness.  Lymphadenopathy:    He has no cervical adenopathy.  Skin: No rash noted. No erythema.  No foot lesions  Psychiatric: He has a normal mood and affect. His behavior is normal.          Assessment & Plan:

## 2016-09-14 NOTE — Discharge Instructions (Signed)
Cataract Surgery, Care After °Refer to this sheet in the next few weeks. These instructions provide you with information about caring for yourself after your procedure. Your health care provider may also give you more specific instructions. Your treatment has been planned according to current medical practices, but problems sometimes occur. Call your health care provider if you have any problems or questions after your procedure. °What can I expect after the procedure? °After the procedure, it is common to have: °· Itching. °· Discomfort. °· Fluid discharge. °· Sensitivity to light and to touch. °· Bruising. °Follow these instructions at home: °Eye Care  °· Check your eye every day for signs of infection. Watch for: °¨ Redness, swelling, or pain. °¨ Fluid, blood, or pus. °¨ Warmth. °¨ Bad smell. °Activity  °· Avoid strenuous activities, such as playing contact sports, for as long as told by your health care provider. °· Do not drive or operate heavy machinery until your health care provider approves. °· Do not bend or lift heavy objects . Bending increases pressure in the eye. You can walk, climb stairs, and do light household chores. °· Ask your health care provider when you can return to work. If you work in a dusty environment, you may be advised to wear protective eyewear for a period of time. °General instructions  °· Take or apply over-the-counter and prescription medicines only as told by your health care provider. This includes eye drops. °· Do not touch or rub your eyes. °· If you were given a protective shield, wear it as told by your health care provider. If you were not given a protective shield, wear sunglasses as told by your health care provider to protect your eyes. °· Keep the area around your eye clean and dry. Avoid swimming or allowing water to hit you directly in the face while showering until told by your health care provider. Keep soap and shampoo out of your eyes. °· Do not put a contact lens  into the affected eye or eyes until your health care provider approves. °· Keep all follow-up visits as told by your health care provider. This is important. °Contact a health care provider if: ° °· You have increased bruising around your eye. °· You have pain that is not helped with medicine. °· You have a fever. °· You have redness, swelling, or pain in your eye. °· You have fluid, blood, or pus coming from your incision. °· Your vision gets worse. °Get help right away if: °· You have sudden vision loss. °This information is not intended to replace advice given to you by your health care provider. Make sure you discuss any questions you have with your health care provider. °Document Released: 10/23/2004 Document Revised: 08/14/2015 Document Reviewed: 02/13/2015 °Elsevier Interactive Patient Education © 2017 [employer] ° ° ° ° °General Anesthesia, Adult, Care After °These instructions provide you with information about caring for yourself after your procedure. Your health care provider may also give you more specific instructions. Your treatment has been planned according to current medical practices, but problems sometimes occur. Call your health care provider if you have any problems or questions after your procedure. °What can I expect after the procedure? °After the procedure, it is common to have: °· Vomiting. °· A sore throat. °· Mental slowness. °It is common to feel: °· Nauseous. °· Cold or shivery. °· Sleepy. °· Tired. °· Sore or achy, even in parts of your body where you did not have surgery. °Follow these instructions at   home: °For at least 24 hours after the procedure:  °· Do not: °¨ Participate in activities where you could fall or become injured. °¨ Drive. °¨ Use heavy machinery. °¨ Drink alcohol. °¨ Take sleeping pills or medicines that cause drowsiness. °¨ Make important decisions or sign legal documents. °¨ Take care of children on your own. °· Rest. °Eating and drinking  °· If you vomit, drink  water, juice, or soup when you can drink without vomiting. °· Drink enough fluid to keep your urine clear or pale yellow. °· Make sure you have little or no nausea before eating solid foods. °· Follow the diet recommended by your health care provider. °General instructions  °· Have a responsible adult stay with you until you are awake and alert. °· Return to your normal activities as told by your health care provider. Ask your health care provider what activities are safe for you. °· Take over-the-counter and prescription medicines only as told by your health care provider. °· If you smoke, do not smoke without supervision. °· Keep all follow-up visits as told by your health care provider. This is important. °Contact a health care provider if: °· You continue to have nausea or vomiting at home, and medicines are not helpful. °· You cannot drink fluids or start eating again. °· You cannot urinate after 8-12 hours. °· You develop a skin rash. °· You have fever. °· You have increasing redness at the site of your procedure. °Get help right away if: °· You have difficulty breathing. °· You have chest pain. °· You have unexpected bleeding. °· You feel that you are having a life-threatening or urgent problem. °This information is not intended to replace advice given to you by your health care provider. Make sure you discuss any questions you have with your health care provider. °Document Released: 07/12/2000 Document Revised: 09/08/2015 Document Reviewed: 03/20/2015 °Elsevier Interactive Patient Education © 2017 [employer] Inc. ° °

## 2016-09-14 NOTE — Assessment & Plan Note (Signed)
Having some low reactions Will decrease the metformin to 1 daily if A1c still very low

## 2016-09-14 NOTE — Assessment & Plan Note (Signed)
BP Readings from Last 3 Encounters:  09/14/16 122/78  08/18/16 136/82  03/16/16 116/78   Good control No change needed

## 2016-09-14 NOTE — Assessment & Plan Note (Signed)
No angina.  Doing well. 

## 2016-09-15 ENCOUNTER — Encounter
Admission: RE | Disposition: A | Discharge status: Home / Self Care | Payer: Self-pay | Source: Ambulatory Visit | Attending: Ophthalmology

## 2016-09-15 ENCOUNTER — Ambulatory Visit: Payer: BLUE CROSS/BLUE SHIELD | Admitting: Anesthesiology

## 2016-09-15 ENCOUNTER — Ambulatory Visit
Admission: RE | Admit: 2016-09-15 | Discharge: 2016-09-15 | Disposition: A | Discharge status: Home / Self Care | Payer: BLUE CROSS/BLUE SHIELD | Source: Ambulatory Visit | Attending: Ophthalmology | Admitting: Ophthalmology

## 2016-09-15 ENCOUNTER — Ambulatory Visit: Payer: BLUE CROSS/BLUE SHIELD | Admitting: Internal Medicine

## 2016-09-15 DIAGNOSIS — Z888 Allergy status to other drugs, medicaments and biological substances status: Secondary | ICD-10-CM | POA: Insufficient documentation

## 2016-09-15 DIAGNOSIS — Z881 Allergy status to other antibiotic agents status: Secondary | ICD-10-CM | POA: Insufficient documentation

## 2016-09-15 DIAGNOSIS — H2512 Age-related nuclear cataract, left eye: Secondary | ICD-10-CM | POA: Insufficient documentation

## 2016-09-15 DIAGNOSIS — K760 Fatty (change of) liver, not elsewhere classified: Secondary | ICD-10-CM | POA: Diagnosis not present

## 2016-09-15 DIAGNOSIS — E119 Type 2 diabetes mellitus without complications: Secondary | ICD-10-CM | POA: Diagnosis not present

## 2016-09-15 DIAGNOSIS — E7800 Pure hypercholesterolemia, unspecified: Secondary | ICD-10-CM | POA: Insufficient documentation

## 2016-09-15 DIAGNOSIS — I252 Old myocardial infarction: Secondary | ICD-10-CM | POA: Insufficient documentation

## 2016-09-15 DIAGNOSIS — Z9049 Acquired absence of other specified parts of digestive tract: Secondary | ICD-10-CM | POA: Insufficient documentation

## 2016-09-15 DIAGNOSIS — Z7982 Long term (current) use of aspirin: Secondary | ICD-10-CM | POA: Diagnosis not present

## 2016-09-15 DIAGNOSIS — Z7984 Long term (current) use of oral hypoglycemic drugs: Secondary | ICD-10-CM | POA: Diagnosis not present

## 2016-09-15 DIAGNOSIS — Z79899 Other long term (current) drug therapy: Secondary | ICD-10-CM | POA: Diagnosis not present

## 2016-09-15 DIAGNOSIS — Z8719 Personal history of other diseases of the digestive system: Secondary | ICD-10-CM | POA: Insufficient documentation

## 2016-09-15 DIAGNOSIS — Z955 Presence of coronary angioplasty implant and graft: Secondary | ICD-10-CM | POA: Insufficient documentation

## 2016-09-15 DIAGNOSIS — K219 Gastro-esophageal reflux disease without esophagitis: Secondary | ICD-10-CM | POA: Diagnosis not present

## 2016-09-15 DIAGNOSIS — I10 Essential (primary) hypertension: Secondary | ICD-10-CM | POA: Diagnosis not present

## 2016-09-15 DIAGNOSIS — Z951 Presence of aortocoronary bypass graft: Secondary | ICD-10-CM | POA: Diagnosis not present

## 2016-09-15 HISTORY — PX: CATARACT EXTRACTION W/PHACO: SHX586

## 2016-09-15 LAB — CBC WITH DIFFERENTIAL/PLATELET
Basophils Absolute: 0.1 10*3/uL (ref 0.0–0.1)
Basophils Relative: 1 % (ref 0.0–3.0)
EOS ABS: 0.2 10*3/uL (ref 0.0–0.7)
EOS PCT: 4 % (ref 0.0–5.0)
HCT: 43.2 % (ref 39.0–52.0)
HEMOGLOBIN: 15.1 g/dL (ref 13.0–17.0)
Lymphocytes Relative: 30.8 % (ref 12.0–46.0)
Lymphs Abs: 1.7 10*3/uL (ref 0.7–4.0)
MCHC: 34.9 g/dL (ref 30.0–36.0)
MCV: 91.3 fl (ref 78.0–100.0)
MONO ABS: 0.4 10*3/uL (ref 0.1–1.0)
Monocytes Relative: 7.2 % (ref 3.0–12.0)
Neutro Abs: 3.1 10*3/uL (ref 1.4–7.7)
Neutrophils Relative %: 57 % (ref 43.0–77.0)
Platelets: 148 10*3/uL — ABNORMAL LOW (ref 150.0–400.0)
RBC: 4.74 Mil/uL (ref 4.22–5.81)
RDW: 13.8 % (ref 11.5–15.5)
WBC: 5.4 10*3/uL (ref 4.0–10.5)

## 2016-09-15 LAB — COMPREHENSIVE METABOLIC PANEL
ALBUMIN: 4.8 g/dL (ref 3.5–5.2)
ALK PHOS: 47 U/L (ref 39–117)
ALT: 32 U/L (ref 0–53)
AST: 21 U/L (ref 0–37)
BUN: 19 mg/dL (ref 6–23)
CO2: 29 mEq/L (ref 19–32)
Calcium: 9.7 mg/dL (ref 8.4–10.5)
Chloride: 103 mEq/L (ref 96–112)
Creatinine, Ser: 1.17 mg/dL (ref 0.40–1.50)
GFR: 66.94 mL/min (ref >60.00)
Glucose, Bld: 103 mg/dL — ABNORMAL HIGH (ref 70–99)
Potassium: 4.5 mEq/L (ref 3.5–5.1)
SODIUM: 137 meq/L (ref 135–145)
TOTAL PROTEIN: 7.3 g/dL (ref 6.0–8.3)
Total Bilirubin: 0.5 mg/dL (ref 0.2–1.2)

## 2016-09-15 LAB — LIPID PANEL
CHOL/HDL RATIO: 4
Cholesterol: 117 mg/dL (ref 0–200)
HDL: 29.7 mg/dL — AB (ref >39.00)
NonHDL: 87.32
TRIGLYCERIDES: 239 mg/dL — AB (ref 0.0–149.0)
VLDL: 47.8 mg/dL — AB (ref 0.0–40.0)

## 2016-09-15 LAB — GLUCOSE, CAPILLARY
Glucose-Capillary: 118 mg/dL — ABNORMAL HIGH (ref 65–99)
Glucose-Capillary: 114 mg/dL — ABNORMAL HIGH (ref 65–99)

## 2016-09-15 LAB — HEMOGLOBIN A1C: Hgb A1c MFr Bld: 5.6 % (ref 4.6–6.5)

## 2016-09-15 LAB — LDL CHOLESTEROL, DIRECT: Direct LDL: 66 mg/dL

## 2016-09-15 SURGERY — PHACOEMULSIFICATION, CATARACT, WITH IOL INSERTION
Anesthesia: Monitor Anesthesia Care | Laterality: Left | Wound class: Clean

## 2016-09-15 MED ORDER — ARMC OPHTHALMIC DILATING DROPS
1.0000 "application " | OPHTHALMIC | Status: DC | PRN
  Administered 2016-09-15 (×3): 1 via OPHTHALMIC

## 2016-09-15 MED ORDER — FENTANYL CITRATE (PF) 100 MCG/2ML IJ SOLN
INTRAMUSCULAR | Status: DC | PRN
  Administered 2016-09-15: 100 ug via INTRAVENOUS

## 2016-09-15 MED ORDER — BRIMONIDINE TARTRATE-TIMOLOL 0.2-0.5 % OP SOLN
OPHTHALMIC | Status: DC | PRN
  Administered 2016-09-15: 1 [drp] via OPHTHALMIC

## 2016-09-15 MED ORDER — NA HYALUR & NA CHOND-NA HYALUR 0.4-0.35 ML IO KIT
PACK | INTRAOCULAR | Status: DC | PRN
  Administered 2016-09-15: 1 mL via INTRAOCULAR

## 2016-09-15 MED ORDER — MOXIFLOXACIN HCL 0.5 % OP SOLN
1.0000 [drp] | OPHTHALMIC | Status: DC | PRN
  Administered 2016-09-15 (×3): 1 [drp] via OPHTHALMIC

## 2016-09-15 MED ORDER — LIDOCAINE HCL (PF) 2 % IJ SOLN
INTRAMUSCULAR | Status: DC | PRN
  Administered 2016-09-15: 1 mL via INTRAOCULAR

## 2016-09-15 MED ORDER — MIDAZOLAM HCL 2 MG/2ML IJ SOLN
INTRAMUSCULAR | Status: DC | PRN
  Administered 2016-09-15: 2 mg via INTRAVENOUS

## 2016-09-15 MED ORDER — MOXIFLOXACIN HCL 0.5 % OP SOLN
OPHTHALMIC | Status: DC | PRN
  Administered 2016-09-15: 0.2 mL

## 2016-09-15 MED ORDER — LACTATED RINGERS IV SOLN: INTRAVENOUS | Status: DC

## 2016-09-15 MED ORDER — EPINEPHRINE PF 1 MG/ML IJ SOLN
INTRAOCULAR | Status: DC | PRN
  Administered 2016-09-15: 56 mL via OPHTHALMIC

## 2016-09-15 SURGICAL SUPPLY — 25 items
CANNULA ANT/CHMB 27GA (MISCELLANEOUS) ×2 IMPLANT
CARTRIDGE ABBOTT (MISCELLANEOUS) IMPLANT
GLOVE SURG LX 7.5 STRW (GLOVE) ×1
GLOVE SURG LX STRL 7.5 STRW (GLOVE) ×1 IMPLANT
GLOVE SURG TRIUMPH 8.0 PF LTX (GLOVE) ×2 IMPLANT
GOWN STRL REUS W/ TWL LRG LVL3 (GOWN DISPOSABLE) ×2 IMPLANT
GOWN STRL REUS W/TWL LRG LVL3 (GOWN DISPOSABLE) ×2
LENS IOL TECNIS ITEC 19.5 (Intraocular Lens) ×2 IMPLANT
MARKER SKIN DUAL TIP RULER LAB (MISCELLANEOUS) ×2 IMPLANT
NDL RETROBULBAR .5 NSTRL (NEEDLE) IMPLANT
NEEDLE FILTER BLUNT 18X 1/2SAF (NEEDLE) ×1
NEEDLE FILTER BLUNT 18X1 1/2 (NEEDLE) ×1 IMPLANT
PACK CATARACT BRASINGTON (MISCELLANEOUS) ×2 IMPLANT
PACK EYE AFTER SURG (MISCELLANEOUS) ×2 IMPLANT
PACK OPTHALMIC (MISCELLANEOUS) ×2 IMPLANT
RING MALYGIN 7.0 (MISCELLANEOUS) IMPLANT
SUT ETHILON 10-0 CS-B-6CS-B-6 (SUTURE)
SUT VICRYL  9 0 (SUTURE)
SUT VICRYL 9 0 (SUTURE) IMPLANT
SUTURE EHLN 10-0 CS-B-6CS-B-6 (SUTURE) IMPLANT
SYR 3ML LL SCALE MARK (SYRINGE) ×2 IMPLANT
SYR 5ML LL (SYRINGE) ×2 IMPLANT
SYR TB 1ML LUER SLIP (SYRINGE) ×2 IMPLANT
WATER STERILE IRR 250ML POUR (IV SOLUTION) ×2 IMPLANT
WIPE NON LINTING 3.25X3.25 (MISCELLANEOUS) ×2 IMPLANT

## 2016-09-15 NOTE — Op Note (Signed)
OPERATIVE NOTE  ABSALOM ARO [id number] 09/15/2016   PREOPERATIVE DIAGNOSIS:  Nuclear sclerotic cataract left eye. H25.12   POSTOPERATIVE DIAGNOSIS:    Nuclear sclerotic cataract left eye.     PROCEDURE:  Phacoemusification with posterior chamber intraocular lens placement of the left eye   LENS:   Implant Name Type Inv. Item Serial No. Manufacturer Lot No. LRB No. Used  LENS IOL DIOP 19.5 - [id number] Intraocular Lens LENS IOL DIOP 19.5 [id number] AMO   Left 1        ULTRASOUND TIME: 13  % of 0 minutes 56 seconds, CDE 7.9  SURGEON:  Wyonia Hough, MD   ANESTHESIA:  Topical with tetracaine drops and 2% Xylocaine jelly, augmented with 1% preservative-free intracameral lidocaine.    COMPLICATIONS:  None.   DESCRIPTION OF PROCEDURE:  The patient was identified in the holding room and transported to the operating room and placed in the supine position under the operating microscope.  The left eye was identified as the operative eye and it was prepped and draped in the usual sterile ophthalmic fashion.   A 1 millimeter clear-corneal paracentesis was made at the 1:30 position.  0.5 ml of preservative-free 1% lidocaine was injected into the anterior chamber.  The anterior chamber was filled with Viscoat viscoelastic.  A 2.4 millimeter keratome was used to make a near-clear corneal incision at the 10:30 position.  .  A curvilinear capsulorrhexis was made with a cystotome and capsulorrhexis forceps.  Balanced salt solution was used to hydrodissect and hydrodelineate the nucleus.   Phacoemulsification was then used in stop and chop fashion to remove the lens nucleus and epinucleus.  The remaining cortex was then removed using the irrigation and aspiration handpiece. Provisc was then placed into the capsular bag to distend it for lens placement.  A lens was then injected into the capsular bag.  The remaining viscoelastic was aspirated.   Wounds were hydrated with balanced salt  solution.  The anterior chamber was inflated to a physiologic pressure with balanced salt solution.  No wound leaks were noted. Vigamox 0.2 ml of a 1mg  per ml solution was injected into the anterior chamber for a dose of 0.2 mg of intracameral antibiotic at the completion of the case.   Timolol and Brimonidine drops were applied to the eye.  The patient was taken to the recovery room in stable condition without complications of anesthesia or surgery.  Tawnya Pujol 09/15/2016, 9:08 AM

## 2016-09-15 NOTE — Anesthesia Procedure Notes (Signed)
Procedure Name: MAC Date/Time: 09/15/2016 8:48 AM Performed by: Janna Arch Pre-anesthesia Checklist: Patient identified, Emergency Drugs available, Suction available and Patient being monitored Patient Re-evaluated:Patient Re-evaluated prior to inductionOxygen Delivery Method: Nasal cannula

## 2016-09-15 NOTE — H&P (Signed)
The History and Physical notes are on paper, have been signed, and are to be scanned. The patient remains stable and unchanged from the H&P.   Previous H&P reviewed, patient examined, and there are no changes.  Joseph [name] 09/15/2016 8:22 AM

## 2016-09-15 NOTE — Anesthesia Preprocedure Evaluation (Signed)
Anesthesia Evaluation  Patient identified by MRN, date of birth, ID band Patient awake    Reviewed: Allergy & Precautions, NPO status   Airway Mallampati: II  TM Distance: >3 FB     Dental no notable dental hx.    Pulmonary former smoker,    breath sounds clear to auscultation       Cardiovascular hypertension, + CAD and + Past MI   Rhythm:regular Rate:Normal  s/p emergent 4 V CABG (4/10) w 2/4 patent grafts by cath 08/11/09, patent LIMA to LAD & SVG to OM w moderate disease native RCA & occluded grafts to RCA and diagonal   Neuro/Psych  Headaches,    GI/Hepatic GERD  ,  Endo/Other  diabetes, Type 2, Oral Hypoglycemic Agents  Renal/GU      Musculoskeletal   Abdominal   Peds  Hematology   Anesthesia Other Findings   Reproductive/Obstetrics                             Anesthesia Physical  Anesthesia Plan  ASA: III  Anesthesia Plan: MAC   Post-op Pain Management:    Induction:   Airway Management Planned:   Additional Equipment:   Intra-op Plan:   Post-operative Plan:   Informed Consent: I have reviewed the patients History and Physical, chart, labs and discussed the procedure including the risks, benefits and alternatives for the proposed anesthesia with the patient or authorized representative who has indicated his/her understanding and acceptance.     Plan Discussed with:   Anesthesia Plan Comments:         Anesthesia Quick Evaluation

## 2016-09-15 NOTE — Transfer of Care (Signed)
Immediate Anesthesia Transfer of Care Note  Patient: Joseph [name]  Procedure(s) Performed: Procedure(s) with comments: CATARACT EXTRACTION PHACO AND INTRAOCULAR LENS PLACEMENT (IOC)  Left diabetic (Left) - Diabetic - oral meds  Patient Location: PACU  Anesthesia Type: MAC  Level of Consciousness: awake, alert  and patient cooperative  Airway and Oxygen Therapy: Patient Spontanous Breathing and Patient connected to supplemental oxygen  Post-op Assessment: Post-op Vital signs reviewed, Patient's Cardiovascular Status Stable, Respiratory Function Stable, Patent Airway and No signs of Nausea or vomiting  Post-op Vital Signs: Reviewed and stable  Complications: No apparent anesthesia complications

## 2016-09-15 NOTE — Anesthesia Postprocedure Evaluation (Signed)
Anesthesia Post Note  Patient: Joseph [name]  Procedure(s) Performed: Procedure(s) (LRB): CATARACT EXTRACTION PHACO AND INTRAOCULAR LENS PLACEMENT (IOC)  Left diabetic (Left)  Patient location during evaluation: PACU Anesthesia Type: MAC Level of consciousness: awake and alert and oriented Pain management: satisfactory to patient Vital Signs Assessment: post-procedure vital signs reviewed and stable Respiratory status: spontaneous breathing, nonlabored ventilation and respiratory function stable Cardiovascular status: blood pressure returned to baseline and stable Postop Assessment: Adequate PO intake and No signs of nausea or vomiting Anesthetic complications: no    Raliegh Ip

## 2016-09-16 ENCOUNTER — Encounter: Payer: Self-pay | Admitting: Ophthalmology

## 2016-09-16 LAB — HM DIABETES EYE EXAM

## 2016-09-24 LAB — HM DIABETES EYE EXAM

## 2016-09-26 ENCOUNTER — Other Ambulatory Visit: Payer: Self-pay | Admitting: Cardiovascular Disease

## 2016-09-27 ENCOUNTER — Other Ambulatory Visit: Payer: Self-pay | Admitting: Cardiovascular Disease

## 2016-09-27 NOTE — Telephone Encounter (Signed)
Medication Detail    Disp Refills Start End   losartan (COZAAR) 50 MG tablet 90 tablet 3 01/09/2016    Sig - Route: Take 1 tablet (50 mg total) by mouth daily. - Oral   Patient taking differently: Take 50 mg by mouth daily. am       E-Prescribing Status: Receipt confirmed by pharmacy (01/09/2016 4:24 PM EDT)   Pharmacy   [id number] - [place], [place]

## 2016-09-30 ENCOUNTER — Other Ambulatory Visit: Payer: Self-pay | Admitting: Cardiovascular Disease

## 2016-11-02 LAB — HM DIABETES EYE EXAM

## 2016-11-09 ENCOUNTER — Other Ambulatory Visit: Payer: Self-pay | Admitting: Internal Medicine

## 2016-12-06 ENCOUNTER — Other Ambulatory Visit: Payer: Self-pay | Admitting: Internal Medicine

## 2016-12-06 NOTE — Telephone Encounter (Signed)
Yes--- I changed the sig to 1/2- 1 daily at bedtime so he is due now for a refill of #90 x 0  (got the other one 3 months ago)

## 2016-12-06 NOTE — Telephone Encounter (Signed)
Patient left message on line. He states that his pharmacy indicates he needs authorization on his refill request for alprazolam. He states that he typically only take 1/2 tablet at bedtime, but will occasionally, if needed, take a second 1/2 tablet if he is still struggling to sleep. He states he discussed this with PCP at last visit and he was informed that the script would be rewritten to indicate this so that should he need to refill the script a little early, he would be able to. He is okay on medication through Friday. [facility] [address]  [place] Master [phone]

## 2016-12-07 MED ORDER — ALPRAZOLAM 0.5 MG PO TABS: ORAL_TABLET | ORAL | 0 refills | Status: DC

## 2016-12-07 NOTE — Telephone Encounter (Signed)
Left message on machine to call back. Medication has not yet been called into his pharmacy

## 2016-12-07 NOTE — Telephone Encounter (Signed)
Rx was called into pharmacy. Left another message on pt's machine to call back

## 2016-12-07 NOTE — Addendum Note (Signed)
Addended by: Benson Setting L on: 12/07/2016 02:59 PM   Modules accepted: Orders

## 2016-12-16 ENCOUNTER — Encounter: Payer: Self-pay | Admitting: Podiatry

## 2016-12-16 ENCOUNTER — Ambulatory Visit: Payer: BLUE CROSS/BLUE SHIELD

## 2016-12-16 ENCOUNTER — Ambulatory Visit (INDEPENDENT_AMBULATORY_CARE_PROVIDER_SITE_OTHER): Payer: BLUE CROSS/BLUE SHIELD | Admitting: Podiatry

## 2016-12-16 VITALS — BP 172/102 | HR 64

## 2016-12-16 DIAGNOSIS — M79671 Pain in right foot: Secondary | ICD-10-CM

## 2016-12-16 DIAGNOSIS — M795 Residual foreign body in soft tissue: Secondary | ICD-10-CM | POA: Diagnosis not present

## 2016-12-16 NOTE — Progress Notes (Signed)
Subjective:    Patient ID: Joseph [name], male   DOB: 61 y.o.   MRN: [id number]   HPI patient states that he stepped on something and he has what he thinks is a splinter in his foot. He is a diabetic and this occurred this morning    ROS      Objective:  Physical Exam neurovascular status was found to be intact muscle strength was adequate range of motion within normal limits with patient noted to have an area of trauma the right distal lateral foot plantarly that is sore when palpated with no proximal edema erythema drainage noted     Assessment:   Foreign body of the right plantar foot      Plan:    H&P condition reviewed and anesthetized with 60 Milligan times like Marcaine mixture. Then did sterile prep of the area using sterile sharp and some irritation I opened the area up and found there to be a small splinter within which I removed in toto and then flushed the wound. There was no drainage noted I applied sterile dressing instructed on soaks and reappoint if any issues should occur

## 2016-12-16 NOTE — Progress Notes (Signed)
   Subjective:    Patient ID: Joseph [name], male    DOB: 08-Nov-1955, 61 y.o.   MRN: [id number]  HPI  Chief Complaint  Patient presents with  . foreign body in foot    Rt foot       Review of Systems  All other systems reviewed and are negative.      Objective:   Physical Exam        Assessment & Plan:

## 2016-12-26 ENCOUNTER — Other Ambulatory Visit: Payer: Self-pay | Admitting: Cardiovascular Disease

## 2017-02-18 ENCOUNTER — Other Ambulatory Visit: Payer: Self-pay | Admitting: Internal Medicine

## 2017-02-22 ENCOUNTER — Telehealth: Payer: Self-pay

## 2017-02-22 NOTE — Telephone Encounter (Signed)
He should have a 6 month visit and the A1c is checked then Please check on this

## 2017-02-22 NOTE — Telephone Encounter (Signed)
Last A1c 09/14/16.Please advise.

## 2017-02-22 NOTE — Telephone Encounter (Signed)
Copied from [place]. Topic: Appointment Scheduling - Scheduling Inquiry for Clinic >> Feb 22, 2017  2:10 PM Ether Griffins B wrote: Reason for CRM: hemoglobin A1C due 03/17/17. No order in for those labs to be done couldn't schedule the patient

## 2017-02-22 NOTE — Telephone Encounter (Signed)
Spoke to pt. Made appt 03-18-17 with Dr Silvio Pate

## 2017-02-24 NOTE — Progress Notes (Signed)
Chief Complaint  Patient presents with  . Coronary Artery Disease   History of Present Illness: 61 yo male with history of CAD s/p 4V CABG, borderline DM, GERD and nephrolithiasis who is here today for cardiac follow up. He was admitted to [facility] in April 2010 with an acute anterior STEMI. Emergent cath demonstrated a patent prior stent in the ostial LAD extending into the mid LAD. There was a hazy 99% stenosis in the mid LAD that was felt to be the culprit. PCI failed after multiple attempts, followed by ventricular fibrillation and the pt was taken emergently to the operating room for bypass. Emergent CABG was performed by Dr. Roxan Hockey with 4 vessels bypassed (LIMA to LAD, SVG to D2, SVG to OM2, SVG to distal RCA). After surgery, he had problems with chest wall pain and went to [facility] for evaluation by CT surgery. He underwent sternotomy with removal of sternal wires and revision by Dr. Lunette Stands on 03/28/09. He was admitted to [facility] April 2011 with chest pain. Cardiac cath repeated and showed patent LIMA to LAD, patent SVG to OM with occluded SVG to Diagonal and occluded SVG to RCA. EF was 45%. He was admitted to [facility] April 2014 with abdominal pain and found to have bowel perforation with partial colectomy and had a reversal 10/11/12. Exercise treadmill stress test March 2015 with good exercise tolerance, no ischemic EKG changes.   He is here today for follow up. The patient denies any chest pain, dyspnea, palpitations, lower extremity edema, orthopnea, PND, dizziness, near syncope or syncope.   Primary Care Physician: Venia Carbon, MD  Past Medical History:  Diagnosis Date  . Arthritis    knees and hip  . CAD (coronary artery disease)    s/p emergent 4 V CABG (4/10) w 2/4 patent grafts by cath 08/11/09, patent LIMA to LAD & SVG to OM w moderate disease native RCA & occluded grafts to RCA and diagonal  . Chest pain    secondary to sternal non-union post CABg-now s/p  sternotomy w repair 03/28/09 at [facility], dr. Lunette Stands  . Diverticulitis    with perforated colon  . Fatty liver    non-alcoholic  . GERD (gastroesophageal reflux disease)    with Schatzki ring  . Headache    2x month  . History of chicken pox   . HTN (hypertension)    controlled  . Hypercholesterolemia   . 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 Myocardial infarction ([place]) 3664,4034  . Neck pain    stiffness  . Nephrolithiasis   . Normal echocardiogram 5/10   EF 60, apical & apicallateral HK, PASP 63mmHg. Repeat at [facility] w reported ef of 40%.   . Pneumonia 2016   in past   . Restless leg syndrome   . Type 2 diabetes mellitus, controlled ([place]) 6/15   type 2    Past Surgical History:  Procedure Laterality Date  . CORONARY ANGIOPLASTY  2003   stentx2  . CORONARY ARTERY BYPASS GRAFT  08/16/2008   x4  . ESOPHAGEAL DILATION  ~2009   Dr Earlean Shawl  . ILEOSTOMY  2014   with reversal-- after diverticulitis. Sigmoid colectomy also. Dr Ely/Bird  . INGUINAL HERNIA REPAIR Left x2   Dr Ernst Spell  . pilonidal sinus tract excision    . STERNUM SEPARATION REPAIR  03/2009   [place]  . VASECTOMY  Current Outpatient Medications  Medication Sig Dispense Refill  . ALPRAZolam (XANAX) 0.5 MG tablet Take 0.5-1 tablets (0.25-0.5 mg total) by mouth at bedtime. 1 tablet 0  . aspirin (ASPIR-81) 81 MG EC tablet Take 81 mg by mouth daily. pm    . cyclobenzaprine (FLEXERIL) 10 MG tablet Take 0.5-1 tablets (5-10 mg total) by mouth 3 (three) times daily as needed for muscle spasms. 30 tablet 1  . ibuprofen (ADVIL,MOTRIN) 200 MG tablet Take 400 mg by mouth 2 (two) times daily as needed (pain).     Marland Kitchen ketoconazole (NIZORAL) 2 % cream Apply 1 application topically daily.    Marland Kitchen losartan (COZAAR) 50 MG tablet Take 1 tablet (50 mg total) by mouth daily. (Patient taking  differently: Take 50 mg by mouth daily. am) 90 tablet 3  . metFORMIN (GLUCOPHAGE-XR) 750 MG 24 hr tablet TAKE 1 TABLET (750 MG TOTAL) BY MOUTH 2 (TWO) TIMES DAILY. (Patient taking differently: 750 mg. TAKE 1 TABLET (750 MG TOTAL) BY MOUTH 2 (TWO) TIMES DAILY.) 180 tablet 3  . metoprolol succinate (TOPROL-XL) 50 MG 24 hr tablet TAKE 1 TABLET (50 MG TOTAL) BY MOUTH DAILY. TAKE WITH OR IMMEDIATELY FOLLOWING A MEAL. 90 tablet 0  . metroNIDAZOLE (METROCREAM) 0.75 % cream Apply 1 application topically daily.    . nitroGLYCERIN (NITROSTAT) 0.4 MG SL tablet Place 1 tablet (0.4 mg total) under the tongue every 5 (five) minutes as needed for chest pain. 25 tablet 6  . Omega-3 Fatty Acids (FISH OIL) 1000 MG CAPS Take 1,000 mg by mouth daily.     Marland Kitchen omeprazole (PRILOSEC) 40 MG capsule Take 1 capsule (40 mg total) by mouth daily. (Patient taking differently: Take 40 mg by mouth daily. am) 90 capsule 3  . pravastatin (PRAVACHOL) 40 MG tablet TAKE 1 TABLET BY MOUTH DAILY. 90 tablet 3  . traZODone (DESYREL) 100 MG tablet Take 1.5 tablets (150 mg total) by mouth at bedtime as needed for sleep. (Patient taking differently: Take 100 mg by mouth at bedtime as needed for sleep. ) 135 tablet 3   No current facility-administered medications for this visit.     Allergies  Allergen Reactions  . Zolpidem Other (See Comments)    hallucinate  . Cephalexin Hives  . Keflin [Cephalothin]     rash  . Latex Dermatitis    Bandages and adhesives/ poss to latex  . Lisinopril Cough    REACTION: cough  . Zolpidem Tartrate     UNKNOWN  . Lamisil [Terbinafine Hcl] Rash    Rash/itchy    History   Social History  . Marital Status: Married    Spouse Name: N/A    Number of Children: 3  . Years of Education: N/A   Occupational History  . Not on file.   Social History Main Topics  . Smoking status: Former Smoker    Quit date: 07/18/2008  . Smokeless tobacco: Not on file     Comment: No smoking since bypass in 4/10  .  Alcohol Use: No       . Drug Use: No  . Sexually Active: Not on file   Other Topics Concern  . Not on file   Social History Narrative       Family History  Problem Relation Age of Onset  . Heart failure Mother   . Diabetes Mother   . Cancer Mother        ovarian?  . Heart disease Mother   . Congestive Heart Failure Mother  cause of death  . Heart attack Father   . Heart attack Brother   . Diabetes Brother   . Heart attack Brother   . Diabetes Brother   . Heart attack Sister   . Diabetes Sister   . Cancer Maternal Aunt        died of breast cancer  . Cancer Maternal Grandmother        died of bone cancer    Review of Systems:  As stated in the HPI and otherwise negative.   Ht 5\' 10"  (1.778 m)   BMI 22.81 kg/m   Physical Examination:  General: Well developed, well nourished, NAD  HEENT: OP clear, mucus membranes moist  SKIN: warm, dry. No rashes. Neuro: No focal deficits  Musculoskeletal: Muscle strength 5/5 all ext  Psychiatric: Mood and affect normal  Neck: No JVD, no carotid bruits, no thyromegaly, no lymphadenopathy.  Lungs:Clear bilaterally, no wheezes, rhonci, crackles Cardiovascular: Regular rate and rhythm. No murmurs, gallops or rubs. Abdomen:Soft. Bowel sounds present. Non-tender.  Extremities: No lower extremity edema. Pulses are 2 + in the bilateral DP/PT.  EKG:  EKG is  ordered today. The ekg ordered today demonstrates sinus brady, rate 58 bpm. T wave inv lateral and anterolateral leads, unchanged.   Recent Labs: 09/14/2016: ALT 32; BUN 19; Creatinine, Ser 1.17; Hemoglobin 15.1; Platelets 148.0; Potassium 4.5; Sodium 137   Lipid Panel    Component Value Date/Time   CHOL 117 09/14/2016 1720   CHOL 92 09/14/2012 1521   TRIG 239.0 (H) 09/14/2016 1720   TRIG 230 (H) 09/14/2012 1521   HDL 29.70 (L) 09/14/2016 1720   HDL 22 (L) 09/14/2012 1521   CHOLHDL 4 09/14/2016 1720   VLDL 47.8 (H) 09/14/2016 1720   VLDL 46 (H) 09/14/2012 1521    LDLCALC 43 07/24/2014 1030   LDLCALC 24 09/14/2012 1521   LDLDIRECT 66.0 09/14/2016 1720     Wt Readings from Last 3 Encounters:  09/15/16 159 lb (72.1 kg)  09/14/16 160 lb (72.6 kg)  08/18/16 159 lb (72.1 kg)     Other studies Reviewed: Additional studies/ records that were reviewed today include: . Review of the above records demonstrates:   Assessment and Plan:   1. CAD s/p CABG without angina: He has no chest pain. Will continue ASA, beta blocker, ARB and statin. He is exercising several days per week.   2. Tobacco abuse, in remission: No tobacco use since 2010.   3. HTN: BP is controlled. No changes today.    Current medicines are reviewed at length with the patient today.  The patient does not have concerns regarding medicines.  The following changes have been made:  no change  Labs/ tests ordered today include:   Orders Placed This Encounter  Procedures  . EKG 12-Lead    Disposition:   FU with me in 12 months  Signed, [name], MD 02/25/2017 10:49 AM    [facility] [address], [place], [place]  [address] Phone: [phone]; Fax: [phone]

## 2017-02-25 ENCOUNTER — Ambulatory Visit (INDEPENDENT_AMBULATORY_CARE_PROVIDER_SITE_OTHER): Payer: BLUE CROSS/BLUE SHIELD | Admitting: Cardiovascular Disease

## 2017-02-25 ENCOUNTER — Encounter: Payer: Self-pay | Admitting: Cardiovascular Disease

## 2017-02-25 VITALS — Ht 70.0 in

## 2017-02-25 DIAGNOSIS — I10 Essential (primary) hypertension: Secondary | ICD-10-CM

## 2017-02-25 DIAGNOSIS — I2510 Atherosclerotic heart disease of native coronary artery without angina pectoris: Secondary | ICD-10-CM

## 2017-02-25 NOTE — Patient Instructions (Signed)

## 2017-03-18 ENCOUNTER — Encounter: Payer: Self-pay | Admitting: Internal Medicine

## 2017-03-18 ENCOUNTER — Ambulatory Visit (INDEPENDENT_AMBULATORY_CARE_PROVIDER_SITE_OTHER): Payer: BLUE CROSS/BLUE SHIELD | Admitting: Internal Medicine

## 2017-03-18 VITALS — BP 140/78 | HR 61 | Temp 97.6°F | Wt 169.0 lb

## 2017-03-18 DIAGNOSIS — E119 Type 2 diabetes mellitus without complications: Secondary | ICD-10-CM

## 2017-03-18 LAB — HEMOGLOBIN A1C: HEMOGLOBIN A1C: 5.9 % (ref 4.6–6.5)

## 2017-03-18 NOTE — Progress Notes (Signed)
Subjective:    Patient ID: Joseph [name], male    DOB: 02/20/1956, 61 y.o.   MRN: [id number]  HPI Here for follow up of diabetes and other chronic medical conditions  Has cut the metformin to daily Had rare low sugar reactions Now average is ~125 No GI upset or diarrhea Checks 1-2 times per week No sores, pain or numbness in feet (other than some plantar fasciitis)  Moved out near a year ago Not legally separated though Still interacts with wife daily  Walks regularly but plans to restart at [employer] at [facility]  Current Outpatient Medications on File Prior to Visit  Medication Sig Dispense Refill  . ALPRAZolam (XANAX) 0.5 MG tablet Take 0.5-1 tablets (0.25-0.5 mg total) by mouth at bedtime. 1 tablet 0  . aspirin (ASPIR-81) 81 MG EC tablet Take 81 mg by mouth daily. pm    . cyclobenzaprine (FLEXERIL) 10 MG tablet Take 0.5-1 tablets (5-10 mg total) by mouth 3 (three) times daily as needed for muscle spasms. 30 tablet 1  . ibuprofen (ADVIL,MOTRIN) 200 MG tablet Take 400 mg by mouth 2 (two) times daily as needed (pain).     Marland Kitchen ketoconazole (NIZORAL) 2 % cream Apply 1 application topically daily.    Marland Kitchen losartan (COZAAR) 50 MG tablet Take 1 tablet (50 mg total) by mouth daily. (Patient taking differently: Take 50 mg by mouth daily. am) 90 tablet 3  . metFORMIN (GLUCOPHAGE-XR) 750 MG 24 hr tablet TAKE 1 TABLET (750 MG TOTAL) BY MOUTH 2 (TWO) TIMES DAILY. (Patient taking differently: 750 mg. TAKE 1 TABLET (750 MG TOTAL) BY MOUTH 2 (TWO) TIMES DAILY.) 180 tablet 3  . metoprolol succinate (TOPROL-XL) 50 MG 24 hr tablet TAKE 1 TABLET (50 MG TOTAL) BY MOUTH DAILY. TAKE WITH OR IMMEDIATELY FOLLOWING A MEAL. 90 tablet 0  . metroNIDAZOLE (METROCREAM) 0.75 % cream Apply 1 application topically daily.    . nitroGLYCERIN (NITROSTAT) 0.4 MG SL tablet Place 1 tablet (0.4 mg total) under the tongue every 5 (five) minutes as needed for chest pain. 25 tablet 6  . Omega-3 Fatty Acids (FISH OIL) 1000 MG CAPS  Take 1,000 mg by mouth daily.     Marland Kitchen omeprazole (PRILOSEC) 40 MG capsule Take 1 capsule (40 mg total) by mouth daily. (Patient taking differently: Take 40 mg by mouth daily. am) 90 capsule 3  . pravastatin (PRAVACHOL) 40 MG tablet TAKE 1 TABLET BY MOUTH DAILY. 90 tablet 3  . traZODone (DESYREL) 100 MG tablet Take 1.5 tablets (150 mg total) by mouth at bedtime as needed for sleep. (Patient taking differently: Take 100 mg by mouth at bedtime as needed for sleep. ) 135 tablet 3   No current facility-administered medications on file prior to visit.     Allergies  Allergen Reactions  . Zolpidem Other (See Comments)    hallucinate  . Cephalexin Hives  . Keflin [Cephalothin]     rash  . Latex Dermatitis    Bandages and adhesives/ poss to latex  . Lisinopril Cough    REACTION: cough  . Zolpidem Tartrate     UNKNOWN  . Lamisil [Terbinafine] Rash    Rash/itchy    Past Medical History:  Diagnosis Date  . Arthritis    knees and hip  . CAD (coronary artery disease)    s/p emergent 4 V CABG (4/10) w 2/4 patent grafts by cath 08/11/09, patent LIMA to LAD & SVG to OM w moderate disease native RCA & occluded grafts to  RCA and diagonal  . Chest pain    secondary to sternal non-union post CABg-now s/p sternotomy w repair 03/28/09 at [facility], dr. Lunette Stands  . Diverticulitis    with perforated colon  . Fatty liver    non-alcoholic  . GERD (gastroesophageal reflux disease)    with Schatzki ring  . Headache    2x month  . History of chicken pox   . HTN (hypertension)    controlled  . Hypercholesterolemia   . 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 Myocardial infarction ([place]) [id number]  . Neck pain    stiffness  . Nephrolithiasis   . Normal echocardiogram 5/10   EF 60, apical & apicallateral HK, PASP 110mmHg. Repeat at [facility] w reported ef of 40%.   . Pneumonia 2016   in past   .  Restless leg syndrome   . Type 2 diabetes mellitus, controlled ([place]) 6/15   type 2    Past Surgical History:  Procedure Laterality Date  . CATARACT EXTRACTION W/PHACO Right 08/18/2016   Procedure: CATARACT EXTRACTION PHACO AND INTRAOCULAR LENS PLACEMENT ([place]) Right diabetic;  Surgeon: Leandrew Koyanagi, MD;  Location: [place];  Service: Ophthalmology;  Laterality: Right;  Diabetic-oral meds poss Latex allergy  . CATARACT EXTRACTION W/PHACO Left 09/15/2016   Procedure: CATARACT EXTRACTION PHACO AND INTRAOCULAR LENS PLACEMENT ([place])  Left diabetic;  Surgeon: Leandrew Koyanagi, MD;  Location: [place];  Service: Ophthalmology;  Laterality: Left;  Diabetic - oral meds  . CORONARY ANGIOPLASTY  2003   stentx2  . CORONARY ARTERY BYPASS GRAFT  08/16/2008   x4  . ESOPHAGEAL DILATION  ~2009   Dr Earlean Shawl  . ILEOSTOMY  2014   with reversal-- after diverticulitis. Sigmoid colectomy also. Dr Ely/Bird  . INGUINAL HERNIA REPAIR Left x2   Dr Ernst Spell  . pilonidal sinus tract excision    . STERNUM SEPARATION REPAIR  03/2009   [place]  . VASECTOMY      Family History  Problem Relation Age of Onset  . Heart failure Mother   . Diabetes Mother   . Cancer Mother        ovarian?  . Heart disease Mother   . Congestive Heart Failure Mother        cause of death  . Heart attack Father   . Heart attack Brother   . Diabetes Brother   . Heart attack Brother   . Diabetes Brother   . Heart attack Sister   . Diabetes Sister   . Cancer Maternal Aunt        died of breast cancer  . Cancer Maternal Grandmother        died of bone cancer    Social History   Socioeconomic History  . Marital status: Legally Separated    Spouse name: Not on file  . Number of children: 3  . Years of education: Not on file  . Highest education level: Not on file  Social Needs  . Financial resource strain: Not on file  . Food insecurity - worry: Not on file  . Food  insecurity - inability: Not on file  . Transportation needs - medical: Not on file  . Transportation needs - non-medical: Not on file  Occupational History  . Occupation: Animator    Comment: Disabled  . Occupation: Florist    Comment: part time  Tobacco Use  . Smoking status: Former  Smoker    Packs/day: 0.50    Years: 37.00    Pack years: 18.50    Types: Cigarettes    Last attempt to quit: 07/18/2008    Years since quitting: 8.6  . Smokeless tobacco: Never Used  . Tobacco comment: No smoking since bypass in 4/10  Substance and Sexual Activity  . Alcohol use: Yes    Alcohol/week: 0.0 oz    Comment: rarely  . Drug use: No  . Sexual activity: Not on file  Other Topics Concern  . Not on file  Social History Narrative   No living will   Wife is health care POA   Would accept resuscitation   Not sure about tube feeds--probably not prolonged   Review of Systems Had splinter removed from foot recently by Dr Paulla Dolly Chronic mild sleep issues---1/2 alprazolam and trazodone do help    Objective:   Physical Exam  Constitutional: He appears well-developed. No distress.  Neck: No thyromegaly present.  Cardiovascular: Normal rate, regular rhythm, normal heart sounds and intact distal pulses. Exam reveals no gallop.  No murmur heard. Pulmonary/Chest: Effort normal and breath sounds normal. No respiratory distress. He has no wheezes. He has no rales.  Musculoskeletal: He exhibits no edema.  Lymphadenopathy:    He has no cervical adenopathy.  Neurological:  Normal sensation in feet  Skin:  No foot lesions          Assessment & Plan:

## 2017-03-18 NOTE — Assessment & Plan Note (Signed)
Has good control If A1c is over 6.5%--will increase the metformin back to 2 daily

## 2017-03-23 ENCOUNTER — Other Ambulatory Visit: Payer: Self-pay | Admitting: Internal Medicine

## 2017-03-26 ENCOUNTER — Other Ambulatory Visit: Payer: Self-pay | Admitting: Cardiovascular Disease

## 2017-03-31 ENCOUNTER — Other Ambulatory Visit: Payer: Self-pay | Admitting: Internal Medicine

## 2017-05-05 LAB — HM DIABETES EYE EXAM

## 2017-05-09 ENCOUNTER — Other Ambulatory Visit: Payer: Self-pay | Admitting: Internal Medicine

## 2017-05-18 ENCOUNTER — Other Ambulatory Visit: Payer: Self-pay | Admitting: Internal Medicine

## 2017-08-22 ENCOUNTER — Other Ambulatory Visit: Payer: Self-pay | Admitting: Internal Medicine

## 2017-08-22 NOTE — Telephone Encounter (Signed)
Last filled 03-04-17 #90 Last OV 03-18-17 Next OV 09-16-17  Forwarding to Dr Lorelei Pont in Dr Alla German absence.

## 2017-09-16 ENCOUNTER — Ambulatory Visit (INDEPENDENT_AMBULATORY_CARE_PROVIDER_SITE_OTHER): Payer: BLUE CROSS/BLUE SHIELD | Admitting: Internal Medicine

## 2017-09-16 ENCOUNTER — Encounter: Payer: Self-pay | Admitting: Internal Medicine

## 2017-09-16 VITALS — BP 120/88 | HR 64 | Temp 98.0°F | Ht 69.0 in | Wt 161.0 lb

## 2017-09-16 DIAGNOSIS — K2100 Gastro-esophageal reflux disease with esophagitis, without bleeding: Secondary | ICD-10-CM

## 2017-09-16 DIAGNOSIS — I2510 Atherosclerotic heart disease of native coronary artery without angina pectoris: Secondary | ICD-10-CM | POA: Diagnosis not present

## 2017-09-16 DIAGNOSIS — G479 Sleep disorder, unspecified: Secondary | ICD-10-CM

## 2017-09-16 DIAGNOSIS — E119 Type 2 diabetes mellitus without complications: Secondary | ICD-10-CM | POA: Diagnosis not present

## 2017-09-16 DIAGNOSIS — Z0000 Encounter for general adult medical examination without abnormal findings: Secondary | ICD-10-CM

## 2017-09-16 DIAGNOSIS — Z125 Encounter for screening for malignant neoplasm of prostate: Secondary | ICD-10-CM | POA: Diagnosis not present

## 2017-09-16 LAB — COMPREHENSIVE METABOLIC PANEL
ALK PHOS: 57 U/L (ref 39–117)
ALT: 43 U/L (ref 0–53)
AST: 22 U/L (ref 0–37)
Albumin: 4.9 g/dL (ref 3.5–5.2)
BUN: 15 mg/dL (ref 6–23)
CHLORIDE: 104 meq/L (ref 96–112)
CO2: 28 mEq/L (ref 19–32)
Calcium: 9.7 mg/dL (ref 8.4–10.5)
Creatinine, Ser: 1.19 mg/dL (ref 0.40–1.50)
GFR: 65.43 mL/min (ref >60.00)
GLUCOSE: 123 mg/dL — AB (ref 70–99)
POTASSIUM: 4.6 meq/L (ref 3.5–5.1)
SODIUM: 139 meq/L (ref 135–145)
Total Bilirubin: 0.7 mg/dL (ref 0.2–1.2)
Total Protein: 7.4 g/dL (ref 6.0–8.3)

## 2017-09-16 LAB — LIPID PANEL
CHOL/HDL RATIO: 4
Cholesterol: 115 mg/dL (ref 0–200)
HDL: 28.8 mg/dL — AB (ref >39.00)
LDL Cholesterol: 58 mg/dL (ref 0–99)
NONHDL: 86.04
Triglycerides: 138 mg/dL (ref 0.0–149.0)
VLDL: 27.6 mg/dL (ref 0.0–40.0)

## 2017-09-16 LAB — T4, FREE: FREE T4: 0.72 ng/dL (ref 0.60–1.60)

## 2017-09-16 LAB — CBC
HEMATOCRIT: 45.7 % (ref 39.0–52.0)
Hemoglobin: 15.9 g/dL (ref 13.0–17.0)
MCHC: 34.8 g/dL (ref 30.0–36.0)
MCV: 90.4 fl (ref 78.0–100.0)
PLATELETS: 142 10*3/uL — AB (ref 150.0–400.0)
RBC: 5.06 Mil/uL (ref 4.22–5.81)
RDW: 13.7 % (ref 11.5–15.5)
WBC: 5.4 10*3/uL (ref 4.0–10.5)

## 2017-09-16 LAB — HEMOGLOBIN A1C: Hgb A1c MFr Bld: 5.8 % (ref 4.6–6.5)

## 2017-09-16 LAB — HM DIABETES FOOT EXAM

## 2017-09-16 LAB — PSA: PSA: 1.03 ng/mL (ref 0.10–4.00)

## 2017-09-16 MED ORDER — METFORMIN HCL ER 750 MG PO TB24: 750.0000 mg | ORAL_TABLET | Freq: Every day | ORAL | 0 refills | Status: DC

## 2017-09-16 NOTE — Assessment & Plan Note (Signed)
Yearly flu vaccine Colon due next year Will check PSA after discussion Add resistance exercise

## 2017-09-16 NOTE — Assessment & Plan Note (Signed)
Quiet on PPI 

## 2017-09-16 NOTE — Assessment & Plan Note (Signed)
Seems to still have good control

## 2017-09-16 NOTE — Assessment & Plan Note (Signed)
Has been quiet No changes needed

## 2017-09-16 NOTE — Assessment & Plan Note (Signed)
Uses the alprazolam

## 2017-09-16 NOTE — Progress Notes (Signed)
Subjective:    Patient ID: Joseph [name], male    DOB: [birth year], 62 y.o.   MRN: [id number]  HPI Here for physical  Noted about 4 weeks ago--- spent a long time on computer for 2 straight days Right hand was numb----has improved Still with some numbness in fingertips of 4th and 5th fingers No foot numbness  Back together with his wife This is good  Checks sugars intermittently--- 1-2 times per week Down to daily on the metformin 120-140 fasting  No heart problems Regular walking --discussed adding resistance training  Current Outpatient Medications on File Prior to Visit  Medication Sig Dispense Refill  . ALPRAZolam (XANAX) 0.5 MG tablet TAKE 1/2-1 TAB BY MOUTH AT BEDTIME 90 tablet 0  . aspirin (ASPIR-81) 81 MG EC tablet Take 81 mg by mouth daily. pm    . cyclobenzaprine (FLEXERIL) 10 MG tablet Take 0.5-1 tablets (5-10 mg total) by mouth 3 (three) times daily as needed for muscle spasms. 30 tablet 1  . ibuprofen (ADVIL,MOTRIN) 200 MG tablet Take 400 mg by mouth 2 (two) times daily as needed (pain).     Marland Kitchen ketoconazole (NIZORAL) 2 % cream Apply 1 application topically daily.    Marland Kitchen losartan (COZAAR) 50 MG tablet TAKE 1 TABLET (50 MG TOTAL) BY MOUTH DAILY. PLEASE KEEP 02/25/17 APPOINTMENT 90 tablet 3  . metoprolol succinate (TOPROL-XL) 50 MG 24 hr tablet TAKE 1 TABLET (50 MG TOTAL) BY MOUTH DAILY. TAKE WITH OR IMMEDIATELY FOLLOWING A MEAL. 90 tablet 3  . metroNIDAZOLE (METROCREAM) 0.75 % cream Apply 1 application topically daily.    . nitroGLYCERIN (NITROSTAT) 0.4 MG SL tablet Place 1 tablet (0.4 mg total) under the tongue every 5 (five) minutes as needed for chest pain. 25 tablet 6  . Omega-3 Fatty Acids (FISH OIL) 1000 MG CAPS Take 1,000 mg by mouth daily.     Marland Kitchen omeprazole (PRILOSEC) 40 MG capsule TAKE 1 CAPSULE (40 MG TOTAL) BY MOUTH DAILY. 90 capsule 3  . pravastatin (PRAVACHOL) 40 MG tablet TAKE 1 TABLET BY MOUTH DAILY. 90 tablet 3  . traZODone (DESYREL) 100 MG tablet TAKE 1.5  TABLETS (150 MG TOTAL) BY MOUTH AT BEDTIME AS NEEDED FOR SLEEP. 135 tablet 3   No current facility-administered medications on file prior to visit.     Allergies  Allergen Reactions  . Zolpidem Other (See Comments)    hallucinate  . Cephalexin Hives  . Keflin [Cephalothin]     rash  . Latex Dermatitis    Bandages and adhesives/ poss to latex  . Lisinopril Cough    REACTION: cough  . Zolpidem Tartrate     UNKNOWN  . Lamisil [Terbinafine] Rash    Rash/itchy    Past Medical History:  Diagnosis Date  . Arthritis    knees and hip  . CAD (coronary artery disease)    s/p emergent 4 V CABG (4/10) w 2/4 patent grafts by cath 08/11/09, patent LIMA to LAD & SVG to OM w moderate disease native RCA & occluded grafts to RCA and diagonal  . Chest pain    secondary to sternal non-union post CABg-now s/p sternotomy w repair 03/28/09 at [facility], dr. Lunette Stands  . Diverticulitis    with perforated colon  . Fatty liver    non-alcoholic  . GERD (gastroesophageal reflux disease)    with Schatzki ring  . Headache    2x month  . History of chicken pox   . HTN (hypertension)    controlled  .  Hypercholesterolemia   . 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 Myocardial infarction ([place]) 3295,1884  . Neck pain    stiffness  . Nephrolithiasis   . Normal echocardiogram 5/10   EF 60, apical & apicallateral HK, PASP 62mmHg. Repeat at [facility] w reported ef of 40%.   . Pneumonia 2016   in past   . Restless leg syndrome   . Type 2 diabetes mellitus, controlled ([place]) 6/15   type 2    Past Surgical History:  Procedure Laterality Date  . CATARACT EXTRACTION W/PHACO Right 08/18/2016   Procedure: CATARACT EXTRACTION PHACO AND INTRAOCULAR LENS PLACEMENT ([place]) Right diabetic;  Surgeon: Leandrew Koyanagi, MD;  Location: [place];  Service: Ophthalmology;  Laterality: Right;  Diabetic-oral  meds poss Latex allergy  . CATARACT EXTRACTION W/PHACO Left 09/15/2016   Procedure: CATARACT EXTRACTION PHACO AND INTRAOCULAR LENS PLACEMENT ([place])  Left diabetic;  Surgeon: Leandrew Koyanagi, MD;  Location: [place];  Service: Ophthalmology;  Laterality: Left;  Diabetic - oral meds  . CORONARY ANGIOPLASTY  2003   stentx2  . CORONARY ARTERY BYPASS GRAFT  08/16/2008   x4  . ESOPHAGEAL DILATION  ~2009   Dr Earlean Shawl  . ILEOSTOMY  2014   with reversal-- after diverticulitis. Sigmoid colectomy also. Dr Ely/Bird  . INGUINAL HERNIA REPAIR Left x2   Dr Ernst Spell  . pilonidal sinus tract excision    . STERNUM SEPARATION REPAIR  03/2009   [place]  . VASECTOMY      Family History  Problem Relation Age of Onset  . Heart failure Mother   . Diabetes Mother   . Cancer Mother        ovarian?  . Heart disease Mother   . Congestive Heart Failure Mother        cause of death  . Heart attack Father   . Heart attack Brother   . Diabetes Brother   . Heart attack Brother   . Diabetes Brother   . Heart attack Sister   . Diabetes Sister   . Cancer Maternal Aunt        died of breast cancer  . Cancer Maternal Grandmother        died of bone cancer    Social History   Socioeconomic History  . Marital status: Married    Spouse name: Not on file  . Number of children: 3  . Years of education: Not on file  . Highest education level: Not on file  Occupational History  . Occupation: Animator    Comment: Disabled  . Occupation: Psychiatric nurse    Comment: part time  Social Needs  . Financial resource strain: Not on file  . Food insecurity:    Worry: Not on file    Inability: Not on file  . Transportation needs:    Medical: Not on file    Non-medical: Not on file  Tobacco Use  . Smoking status: Former Smoker    Packs/day: 0.50    Years: 37.00    Pack years: 18.50    Types: Cigarettes    Last attempt to quit: 07/18/2008    Years since  quitting: 9.1  . Smokeless tobacco: Never Used  . Tobacco comment: No smoking since bypass in 4/10  Substance and Sexual Activity  . Alcohol use: Yes    Alcohol/week: 0.0 oz    Comment: rarely  . Drug use: No  .  Sexual activity: Not on file  Lifestyle  . Physical activity:    Days per week: Not on file    Minutes per session: Not on file  . Stress: Not on file  Relationships  . Social connections:    Talks on phone: Not on file    Gets together: Not on file    Attends religious service: Not on file    Active member of club or organization: Not on file    Attends meetings of clubs or organizations: Not on file    Relationship status: Not on file  . Intimate partner violence:    Fear of current or ex partner: Not on file    Emotionally abused: Not on file    Physically abused: Not on file    Forced sexual activity: Not on file  Other Topics Concern  . Not on file  Social History Narrative   No living will   Wife is health care POA   Would accept resuscitation   Not sure about tube feeds--probably not prolonged   Review of Systems  Constitutional: Negative for fatigue and unexpected weight change.       Wears seat belt  HENT: Negative for hearing loss and tinnitus.        Sensitive spot in mouth--better now.  Not regular with dentist  Eyes: Negative for visual disturbance.       No diplopia or unilateral vision loss Did have recent exam  Respiratory: Negative for cough, chest tightness and shortness of breath.   Cardiovascular: Negative for chest pain, palpitations and leg swelling.  Gastrointestinal: Negative for blood in stool and constipation.       Rare heartburn---on the omeprazole  Endocrine: Negative for polydipsia and polyuria.  Genitourinary: Negative for difficulty urinating and urgency.       Mild ED---not interested in meds  Musculoskeletal: Positive for back pain. Negative for arthralgias and joint swelling.       Uses ibuprofen 1-2 times a week Very  rare cyclobenzaprine---for posterior neck symptoms  Skin:       Slight rosacea No foot lesions  Allergic/Immunologic: Positive for environmental allergies. Negative for immunocompromised state.       Mild symptoms--no meds  Neurological: Negative for dizziness, syncope and light-headedness.       Rare headaches  Hematological: Negative for adenopathy. Bruises/bleeds easily.  Psychiatric/Behavioral: Negative for dysphoric mood.       Occ restless sleep---- uses the alprazolam extra rarely Takes the alprazolam daily at bedtime       Objective:   Physical Exam  Constitutional: He is oriented to person, place, and time. He appears well-developed. No distress.  HENT:  Head: Normocephalic and atraumatic.  Right Ear: External ear normal.  Left Ear: External ear normal.  Mouth/Throat: Oropharynx is clear and moist. No oropharyngeal exudate.  Eyes: Pupils are equal, round, and reactive to light. Conjunctivae are normal.  Neck: No thyromegaly present.  Cardiovascular: Normal rate, regular rhythm, normal heart sounds and intact distal pulses. Exam reveals no gallop.  No murmur heard. Respiratory: Effort normal and breath sounds normal. No respiratory distress. He has no wheezes. He has no rales.  GI: Soft. There is no tenderness.  Musculoskeletal: He exhibits no edema or tenderness.  Lymphadenopathy:    He has no cervical adenopathy.  Neurological: He is alert and oriented to person, place, and time.  Normal sensation in feet  Skin: No rash noted. No erythema.  No foot lesions  Psychiatric: He has a normal  mood and affect. His behavior is normal.           Assessment & Plan:

## 2017-09-29 ENCOUNTER — Encounter: Payer: Self-pay | Admitting: Internal Medicine

## 2017-11-08 ENCOUNTER — Other Ambulatory Visit: Payer: Self-pay | Admitting: Internal Medicine

## 2017-12-24 ENCOUNTER — Other Ambulatory Visit: Payer: Self-pay | Admitting: Internal Medicine

## 2018-02-02 ENCOUNTER — Other Ambulatory Visit: Payer: Self-pay | Admitting: Internal Medicine

## 2018-02-02 NOTE — Telephone Encounter (Signed)
Last filled 08-22-17 #90 Last OV 09-16-17 Next OV 03-24-18

## 2018-03-09 ENCOUNTER — Encounter: Payer: Self-pay | Admitting: Cardiovascular Disease

## 2018-03-09 ENCOUNTER — Ambulatory Visit: Payer: BLUE CROSS/BLUE SHIELD | Admitting: Cardiovascular Disease

## 2018-03-09 VITALS — BP 114/76 | HR 56 | Ht 69.0 in | Wt 164.2 lb

## 2018-03-09 DIAGNOSIS — I2510 Atherosclerotic heart disease of native coronary artery without angina pectoris: Secondary | ICD-10-CM | POA: Diagnosis not present

## 2018-03-09 DIAGNOSIS — I10 Essential (primary) hypertension: Secondary | ICD-10-CM

## 2018-03-09 MED ORDER — NITROGLYCERIN 0.4 MG SL SUBL: 0.4000 mg | SUBLINGUAL_TABLET | SUBLINGUAL | 6 refills | Status: DC | PRN

## 2018-03-09 MED ORDER — LOSARTAN POTASSIUM 50 MG PO TABS: 50.0000 mg | ORAL_TABLET | Freq: Every day | ORAL | 3 refills | Status: DC

## 2018-03-09 NOTE — Patient Instructions (Signed)

## 2018-03-09 NOTE — Progress Notes (Signed)
Chief Complaint  Patient presents with  . Follow-up    CAD   History of Present Illness: 62 yo male with history of CAD s/p 4V CABG, borderline DM, GERD and nephrolithiasis who is here today for cardiac follow up. He was admitted to [facility] in April 2010 with an anterior STEMI. Emergent cath demonstrated a patent prior stent in the ostial LAD extending into the mid LAD. There was a hazy 99% stenosis in the mid LAD that was felt to be the culprit. PCI failed after multiple attempts, followed by ventricular fibrillation and the pt was taken emergently to the operating room for bypass. Emergent CABG was performed by Dr. Roxan Hockey with 4 vessels bypassed (LIMA to LAD, SVG to D2, SVG to OM2, SVG to distal RCA). After surgery, he had problems with chest wall pain and went to [facility] for evaluation by CT surgery. He underwent sternotomy with removal of sternal wires and revision by Dr. Lunette Stands on 03/28/09. He was admitted to [facility] April 2011 with chest pain. Cardiac cath repeated and showed patent LIMA to LAD, patent SVG to OM with occluded SVG to Diagonal and occluded SVG to RCA. EF was 45%. He was admitted to [facility] April 2014 with abdominal pain and found to have bowel perforation with partial colectomy and had a reversal 10/11/12. Exercise treadmill stress test March 2015 with good exercise tolerance, no ischemic EKG changes.   He is here today for follow up. The patient denies any chest pain, dyspnea, palpitations, lower extremity edema, orthopnea, PND, dizziness, near syncope or syncope. He is exercising 3-4 days per week.   Primary Care Physician: Venia Carbon, MD  Past Medical History:  Diagnosis Date  . Arthritis    knees and hip  . CAD (coronary artery disease)    s/p emergent 4 V CABG (4/10) w 2/4 patent grafts by cath 08/11/09, patent LIMA to LAD & SVG to OM w moderate disease native RCA & occluded grafts to RCA and diagonal  . Chest pain    secondary to sternal non-union  post CABg-now s/p sternotomy w repair 03/28/09 at [facility], dr. Lunette Stands  . Diverticulitis    with perforated colon  . Fatty liver    non-alcoholic  . GERD (gastroesophageal reflux disease)    with Schatzki ring  . Headache    2x month  . History of chicken pox   . HTN (hypertension)    controlled  . Hypercholesterolemia   . 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 Myocardial infarction ([place]) 8850,2774  . Neck pain    stiffness  . Nephrolithiasis   . Normal echocardiogram 5/10   EF 60, apical & apicallateral HK, PASP 47mmHg. Repeat at [facility] w reported ef of 40%.   . Pneumonia 2016   in past   . Restless leg syndrome   . Type 2 diabetes mellitus, controlled ([place]) 6/15   type 2    Past Surgical History:  Procedure Laterality Date  . CATARACT EXTRACTION W/PHACO Right 08/18/2016   Procedure: CATARACT EXTRACTION PHACO AND INTRAOCULAR LENS PLACEMENT ([place]) Right diabetic;  Surgeon: Leandrew Koyanagi, MD;  Location: [place];  Service: Ophthalmology;  Laterality: Right;  Diabetic-oral meds poss Latex allergy  . CATARACT EXTRACTION W/PHACO Left 09/15/2016   Procedure: CATARACT EXTRACTION PHACO AND INTRAOCULAR LENS PLACEMENT ([place])  Left diabetic;  Surgeon: Leandrew Koyanagi, MD;  Location: [place];  Service: Ophthalmology;  Laterality: Left;  Diabetic - oral meds  . CORONARY ANGIOPLASTY  2003   stentx2  . CORONARY ARTERY BYPASS GRAFT  08/16/2008   x4  . ESOPHAGEAL DILATION  ~2009   Dr Earlean Shawl  . ILEOSTOMY  2014   with reversal-- after diverticulitis. Sigmoid colectomy also. Dr Ely/Bird  . INGUINAL HERNIA REPAIR Left x2   Dr Ernst Spell  . pilonidal sinus tract excision    . STERNUM SEPARATION REPAIR  03/2009   [place]  . VASECTOMY      Current Outpatient Medications  Medication Sig Dispense Refill  . ALPRAZolam (XANAX) 0.5 MG  tablet TAKE 1/2 TO 1 TABLET BY MOUTH AT BEDTIME 90 tablet 0  . aspirin (ASPIR-81) 81 MG EC tablet Take 81 mg by mouth daily. pm    . cyclobenzaprine (FLEXERIL) 10 MG tablet Take 0.5-1 tablets (5-10 mg total) by mouth 3 (three) times daily as needed for muscle spasms. 30 tablet 1  . ibuprofen (ADVIL,MOTRIN) 200 MG tablet Take 400 mg by mouth 2 (two) times daily as needed (pain).     Marland Kitchen ketoconazole (NIZORAL) 2 % cream Apply 1 application topically daily.    . metFORMIN (GLUCOPHAGE-XR) 750 MG 24 hr tablet Take 1 tablet (750 mg total) by mouth daily. 1 tablet 0  . metoprolol succinate (TOPROL-XL) 50 MG 24 hr tablet TAKE 1 TABLET (50 MG TOTAL) BY MOUTH DAILY. TAKE WITH OR IMMEDIATELY FOLLOWING A MEAL. 90 tablet 3  . metroNIDAZOLE (METROCREAM) 0.75 % cream Apply 1 application topically daily.    . Omega-3 Fatty Acids (FISH OIL) 1000 MG CAPS Take 1,000 mg by mouth daily.     Marland Kitchen omeprazole (PRILOSEC) 40 MG capsule TAKE 1 CAPSULE (40 MG TOTAL) BY MOUTH DAILY. 90 capsule 3  . pravastatin (PRAVACHOL) 40 MG tablet TAKE 1 TABLET BY MOUTH DAILY. 90 tablet 2  . traZODone (DESYREL) 100 MG tablet TAKE 1.5 TABLETS (150 MG TOTAL) BY MOUTH AT BEDTIME AS NEEDED FOR SLEEP. 135 tablet 2  . losartan (COZAAR) 50 MG tablet Take 1 tablet (50 mg total) by mouth daily. 90 tablet 3  . nitroGLYCERIN (NITROSTAT) 0.4 MG SL tablet Place 1 tablet (0.4 mg total) under the tongue every 5 (five) minutes as needed for chest pain. 25 tablet 6   No current facility-administered medications for this visit.     Allergies  Allergen Reactions  . Zolpidem Other (See Comments)    hallucinate  . Cephalexin Hives  . Keflin [Cephalothin]     rash  . Latex Dermatitis    Bandages and adhesives/ poss to latex  . Lisinopril Cough    REACTION: cough  . Zolpidem Tartrate     UNKNOWN  . Lamisil [Terbinafine] Rash    Rash/itchy    History   Social History  . Marital Status: Married    Spouse Name: N/A    Number of Children: 3  .  Years of Education: N/A   Occupational History  . Not on file.   Social History Main Topics  . Smoking status: Former Smoker    Quit date: 07/18/2008  . Smokeless tobacco: Not on file     Comment: No smoking since bypass in 4/10  . Alcohol Use: No       . Drug Use: No  . Sexually Active: Not on file   Other Topics Concern  . Not on file   Social History Narrative       Family History  Problem Relation Age of Onset  .  Heart failure Mother   . Diabetes Mother   . Cancer Mother        ovarian?  . Heart disease Mother   . Congestive Heart Failure Mother        cause of death  . Heart attack Father   . Heart attack Brother   . Diabetes Brother   . Heart attack Brother   . Diabetes Brother   . Heart attack Sister   . Diabetes Sister   . Cancer Maternal Aunt        died of breast cancer  . Cancer Maternal Grandmother        died of bone cancer    Review of Systems:  As stated in the HPI and otherwise negative.   BP 114/76   Pulse (!) 56   Ht 5\' 9"  (1.753 m)   Wt 164 lb 3.2 oz (74.5 kg)   SpO2 97%   BMI 24.25 kg/m   Physical Examination:  General: Well developed, well nourished, NAD  HEENT: OP clear, mucus membranes moist  SKIN: warm, dry. No rashes. Neuro: No focal deficits  Musculoskeletal: Muscle strength 5/5 all ext  Psychiatric: Mood and affect normal  Neck: No JVD, no carotid bruits, no thyromegaly, no lymphadenopathy.  Lungs:Clear bilaterally, no wheezes, rhonci, crackles Cardiovascular: Regular rate and rhythm. No murmurs, gallops or rubs. Abdomen:Soft. Bowel sounds present. Non-tender.  Extremities: No lower extremity edema. Pulses are 2 + in the bilateral DP/PT.  EKG:  EKG is ordered today. The ekg ordered today demonstrates sinus bradycardia, rate 56 bpm. T wave inversion anterior and anterolateral leads. Unchanged.   Recent Labs: 09/16/2017: ALT 43; BUN 15; Creatinine, Ser 1.19; Hemoglobin 15.9; Platelets 142.0; Potassium 4.6; Sodium 139    Lipid Panel    Component Value Date/Time   CHOL 115 09/16/2017 0932   CHOL 92 09/14/2012 1521   TRIG 138.0 09/16/2017 0932   TRIG 230 (H) 09/14/2012 1521   HDL 28.80 (L) 09/16/2017 0932   HDL 22 (L) 09/14/2012 1521   CHOLHDL 4 09/16/2017 0932   VLDL 27.6 09/16/2017 0932   VLDL 46 (H) 09/14/2012 1521   LDLCALC 58 09/16/2017 0932   LDLCALC 24 09/14/2012 1521   LDLDIRECT 66.0 09/14/2016 1720     Wt Readings from Last 3 Encounters:  03/09/18 164 lb 3.2 oz (74.5 kg)  09/16/17 161 lb (73 kg)  03/18/17 169 lb (76.7 kg)     Other studies Reviewed: Additional studies/ records that were reviewed today include: . Review of the above records demonstrates:   Assessment and Plan:   1. CAD s/p CABG without angina: No chest pains. He is active. Will continue ASA, statin, beta blocker and ARB.    2. Tobacco abuse, in remission: No tobacco use since 2010.   3. HTN: BP is controlled. No changes today.   Current medicines are reviewed at length with the patient today.  The patient does not have concerns regarding medicines.  The following changes have been made:  no change  Labs/ tests ordered today include:   Orders Placed This Encounter  Procedures  . EKG 12-Lead    Disposition:   FU with me in 12 months  Signed, [name], MD 03/09/2018 12:09 PM    [address], [place], [place]  [address] Phone: [phone]; Fax: [phone]

## 2018-03-24 ENCOUNTER — Ambulatory Visit: Payer: BLUE CROSS/BLUE SHIELD | Admitting: Internal Medicine

## 2018-03-24 ENCOUNTER — Encounter: Payer: Self-pay | Admitting: Internal Medicine

## 2018-03-24 VITALS — BP 134/82 | HR 58 | Temp 97.4°F | Ht 69.0 in | Wt 165.0 lb

## 2018-03-24 DIAGNOSIS — E119 Type 2 diabetes mellitus without complications: Secondary | ICD-10-CM | POA: Diagnosis not present

## 2018-03-24 LAB — POCT GLYCOSYLATED HEMOGLOBIN (HGB A1C): HEMOGLOBIN A1C: 5.5 % (ref 4.0–5.6)

## 2018-03-24 NOTE — Progress Notes (Signed)
Subjective:    Patient ID: Joseph [name], male    DOB: [birth year], 62 y.o.   MRN: [id number]  HPI Here for follow up of diabetes Having higher sugars 150-160 fasting Checks 3 times a week usually Occasionally checks later in the day--usually about the same  Now only taking the metformin daily---due to good control  No foot numbness or sig pain (other than plantar fasciitis)  Current Outpatient Medications on File Prior to Visit  Medication Sig Dispense Refill  . ALPRAZolam (XANAX) 0.5 MG tablet TAKE 1/2 TO 1 TABLET BY MOUTH AT BEDTIME 90 tablet 0  . aspirin (ASPIR-81) 81 MG EC tablet Take 81 mg by mouth daily. pm    . cyclobenzaprine (FLEXERIL) 10 MG tablet Take 0.5-1 tablets (5-10 mg total) by mouth 3 (three) times daily as needed for muscle spasms. 30 tablet 1  . ibuprofen (ADVIL,MOTRIN) 200 MG tablet Take 400 mg by mouth 2 (two) times daily as needed (pain).     Marland Kitchen ketoconazole (NIZORAL) 2 % cream Apply 1 application topically daily.    Marland Kitchen losartan (COZAAR) 50 MG tablet Take 1 tablet (50 mg total) by mouth daily. 90 tablet 3  . metFORMIN (GLUCOPHAGE-XR) 750 MG 24 hr tablet Take 1 tablet (750 mg total) by mouth daily. 1 tablet 0  . metoprolol succinate (TOPROL-XL) 50 MG 24 hr tablet TAKE 1 TABLET (50 MG TOTAL) BY MOUTH DAILY. TAKE WITH OR IMMEDIATELY FOLLOWING A MEAL. 90 tablet 3  . metroNIDAZOLE (METROCREAM) 0.75 % cream Apply 1 application topically daily.    . nitroGLYCERIN (NITROSTAT) 0.4 MG SL tablet Place 1 tablet (0.4 mg total) under the tongue every 5 (five) minutes as needed for chest pain. 25 tablet 6  . Omega-3 Fatty Acids (FISH OIL) 1000 MG CAPS Take 1,000 mg by mouth daily.     Marland Kitchen omeprazole (PRILOSEC) 40 MG capsule TAKE 1 CAPSULE (40 MG TOTAL) BY MOUTH DAILY. 90 capsule 3  . pravastatin (PRAVACHOL) 40 MG tablet TAKE 1 TABLET BY MOUTH DAILY. 90 tablet 2  . traZODone (DESYREL) 100 MG tablet TAKE 1.5 TABLETS (150 MG TOTAL) BY MOUTH AT BEDTIME AS NEEDED FOR SLEEP. 135 tablet  2   No current facility-administered medications on file prior to visit.     Allergies  Allergen Reactions  . Zolpidem Other (See Comments)    hallucinate  . Cephalexin Hives  . Keflin [Cephalothin]     rash  . Latex Dermatitis    Bandages and adhesives/ poss to latex  . Lisinopril Cough    REACTION: cough  . Zolpidem Tartrate     UNKNOWN  . Lamisil [Terbinafine] Rash    Rash/itchy    Past Medical History:  Diagnosis Date  . Arthritis    knees and hip  . CAD (coronary artery disease)    s/p emergent 4 V CABG (4/10) w 2/4 patent grafts by cath 08/11/09, patent LIMA to LAD & SVG to OM w moderate disease native RCA & occluded grafts to RCA and diagonal  . Chest pain    secondary to sternal non-union post CABg-now s/p sternotomy w repair 03/28/09 at [facility], dr. Lunette Stands  . Diverticulitis    with perforated colon  . Fatty liver    non-alcoholic  . GERD (gastroesophageal reflux disease)    with Schatzki ring  . Headache    2x month  . History of chicken pox   . HTN (hypertension)    controlled  . Hypercholesterolemia   . 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 Myocardial infarction ([place]) 6712,4580  . Neck pain    stiffness  . Nephrolithiasis   . Normal echocardiogram 5/10   EF 60, apical & apicallateral HK, PASP 26mmHg. Repeat at [facility] w reported ef of 40%.   . Pneumonia 2016   in past   . Restless leg syndrome   . Type 2 diabetes mellitus, controlled ([place]) 6/15   type 2    Past Surgical History:  Procedure Laterality Date  . CATARACT EXTRACTION W/PHACO Right 08/18/2016   Procedure: CATARACT EXTRACTION PHACO AND INTRAOCULAR LENS PLACEMENT ([place]) Right diabetic;  Surgeon: Leandrew Koyanagi, MD;  Location: [place];  Service: Ophthalmology;  Laterality: Right;  Diabetic-oral meds poss Latex allergy  . CATARACT EXTRACTION W/PHACO Left 09/15/2016    Procedure: CATARACT EXTRACTION PHACO AND INTRAOCULAR LENS PLACEMENT ([place])  Left diabetic;  Surgeon: Leandrew Koyanagi, MD;  Location: [place];  Service: Ophthalmology;  Laterality: Left;  Diabetic - oral meds  . CORONARY ANGIOPLASTY  2003   stentx2  . CORONARY ARTERY BYPASS GRAFT  08/16/2008   x4  . ESOPHAGEAL DILATION  ~2009   Dr Earlean Shawl  . ILEOSTOMY  2014   with reversal-- after diverticulitis. Sigmoid colectomy also. Dr Ely/Bird  . INGUINAL HERNIA REPAIR Left x2   Dr Ernst Spell  . pilonidal sinus tract excision    . STERNUM SEPARATION REPAIR  03/2009   [place]  . VASECTOMY      Family History  Problem Relation Age of Onset  . Heart failure Mother   . Diabetes Mother   . Cancer Mother        ovarian?  . Heart disease Mother   . Congestive Heart Failure Mother        cause of death  . Heart attack Father   . Heart attack Brother   . Diabetes Brother   . Heart attack Brother   . Diabetes Brother   . Heart attack Sister   . Diabetes Sister   . Cancer Maternal Aunt        died of breast cancer  . Cancer Maternal Grandmother        died of bone cancer    Social History   Socioeconomic History  . Marital status: Married    Spouse name: Not on file  . Number of children: 3  . Years of education: Not on file  . Highest education level: Not on file  Occupational History  . Occupation: Animator    Comment: Disabled  . Occupation: Psychiatric nurse    Comment: part time  Social Needs  . Financial resource strain: Not on file  . Food insecurity:    Worry: Not on file    Inability: Not on file  . Transportation needs:    Medical: Not on file    Non-medical: Not on file  Tobacco Use  . Smoking status: Former Smoker    Packs/day: 0.50    Years: 37.00    Pack years: 18.50    Types: Cigarettes    Last attempt to quit: 07/18/2008    Years since quitting: 9.6  . Smokeless tobacco: Never Used  . Tobacco comment: No smoking  since bypass in 4/10  Substance and Sexual Activity  . Alcohol use: Yes    Alcohol/week: 0.0 standard drinks    Comment: rarely  . Drug use: No  . Sexual activity: Not on file  Lifestyle  . Physical activity:    Days per week: Not on file    Minutes per session: Not on file  . Stress: Not on file  Relationships  . Social connections:    Talks on phone: Not on file    Gets together: Not on file    Attends religious service: Not on file    Active member of club or organization: Not on file    Attends meetings of clubs or organizations: Not on file    Relationship status: Not on file  . Intimate partner violence:    Fear of current or ex partner: Not on file    Emotionally abused: Not on file    Physically abused: Not on file    Forced sexual activity: Not on file  Other Topics Concern  . Not on file  Social History Narrative   No living will   Wife is health care POA   Would accept resuscitation   Not sure about tube feeds--probably not prolonged   Review of Systems  Saw cardiologist--no problems or changes Will get twinge of lateral left chest pain---few seconds only Not really working out---does walk regularly for 30 minutes     Objective:   Physical Exam  Constitutional: He appears well-developed. No distress.  Neck: No thyromegaly present.  Cardiovascular: Normal rate, regular rhythm, normal heart sounds and intact distal pulses.  No murmur heard. Respiratory: Effort normal and breath sounds normal. No respiratory distress. He has no wheezes. He has no rales.  Lymphadenopathy:    He has no cervical adenopathy.  Skin:  Dry skin but no foot lesions  Psychiatric: He has a normal mood and affect. His behavior is normal.           Assessment & Plan:

## 2018-03-24 NOTE — Assessment & Plan Note (Addendum)
Control not as good Lab Results  Component Value Date   HGBA1C 5.5 03/24/2018   Still excellent Okay to continue 750mg  daily---would double if goes up more

## 2018-04-09 ENCOUNTER — Other Ambulatory Visit: Payer: Self-pay | Admitting: Internal Medicine

## 2018-04-16 ENCOUNTER — Other Ambulatory Visit: Payer: Self-pay | Admitting: Internal Medicine

## 2018-05-04 ENCOUNTER — Other Ambulatory Visit: Payer: Self-pay | Admitting: Internal Medicine

## 2018-07-28 ENCOUNTER — Other Ambulatory Visit: Payer: Self-pay | Admitting: Internal Medicine

## 2018-07-28 NOTE — Telephone Encounter (Signed)
Is this okay to refill? 

## 2018-07-31 MED ORDER — ALPRAZOLAM 0.5 MG PO TABS: 0.2500 mg | ORAL_TABLET | Freq: Every day | ORAL | 0 refills | Status: DC

## 2018-07-31 NOTE — Telephone Encounter (Signed)
Please [facility] all [facility].

## 2018-07-31 NOTE — Telephone Encounter (Signed)
Dr Silvio Pate will be back in the office tomorrow so it can wait until then.  Alprazolam last filled 02-02-18 #90 Last OV 03/24/18 Next OV 10/04/18 [facility] [address] [address]

## 2018-07-31 NOTE — Telephone Encounter (Signed)
Pt called to check status, he will be out of medication on Wednesday.  [facility], [place], [place] [phone]

## 2018-08-10 ENCOUNTER — Other Ambulatory Visit: Payer: Self-pay | Admitting: Internal Medicine

## 2018-10-03 ENCOUNTER — Encounter: Payer: Self-pay | Admitting: Internal Medicine

## 2018-10-03 ENCOUNTER — Ambulatory Visit (INDEPENDENT_AMBULATORY_CARE_PROVIDER_SITE_OTHER): Payer: Medicare Other | Admitting: Internal Medicine

## 2018-10-03 VITALS — Temp 98.0°F

## 2018-10-03 DIAGNOSIS — Z20828 Contact with and (suspected) exposure to other viral communicable diseases: Secondary | ICD-10-CM

## 2018-10-03 DIAGNOSIS — Z20822 Contact with and (suspected) exposure to covid-19: Secondary | ICD-10-CM
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
## 2018-10-03 NOTE — Patient Instructions (Signed)
Person Under Monitoring Name: Joseph [name]  Location: [place] [address]   Infection Prevention Recommendations for Individuals Confirmed to have, or Being Evaluated for, 2019 Novel Coronavirus (COVID-19) Infection Who Receive Care at Home  Individuals who are confirmed to have, or are being evaluated for, COVID-19 should follow the prevention steps below until a healthcare provider or local or state health department says they can return to normal activities.  Stay home except to get medical care You should restrict activities outside your home, except for getting medical care. Do not go to work, school, or public areas, and do not use public transportation or taxis.  Call ahead before visiting your doctor Before your medical appointment, call the healthcare provider and tell them that you have, or are being evaluated for, COVID-19 infection. This will help the healthcare provider's office take steps to keep other people from getting infected. Ask your healthcare provider to call the local or state health department.  Monitor your symptoms Seek prompt medical attention if your illness is worsening (e.g., difficulty breathing). Before going to your medical appointment, call the healthcare provider and tell them that you have, or are being evaluated for, COVID-19 infection. Ask your healthcare provider to call the local or state health department.  Wear a facemask You should wear a facemask that covers your nose and mouth when you are in the same room with other people and when you visit a healthcare provider. People who live with or visit you should also wear a facemask while they are in the same room with you.  Separate yourself from other people in your home As much as possible, you should stay in a different room from other people in your home. Also, you should use a separate bathroom, if available.  Avoid sharing household items You should not share  dishes, drinking glasses, cups, eating utensils, towels, bedding, or other items with other people in your home. After using these items, you should wash them thoroughly with soap and water.  Cover your coughs and sneezes Cover your mouth and nose with a tissue when you cough or sneeze, or you can cough or sneeze into your sleeve. Throw used tissues in a lined trash can, and immediately wash your hands with soap and water for at least 20 seconds or use an alcohol-based hand rub.  Wash your [facility] your hands often and thoroughly with soap and water for at least 20 seconds. You can use an alcohol-based hand sanitizer if soap and water are not available and if your hands are not visibly dirty. Avoid touching your eyes, nose, and mouth with unwashed hands.   Prevention Steps for Caregivers and Household Members of Individuals Confirmed to have, or Being Evaluated for, COVID-19 Infection Being Cared for in the Home  If you live with, or provide care at home for, a person confirmed to have, or being evaluated for, COVID-19 infection please follow these guidelines to prevent infection:  Follow healthcare provider's instructions Make sure that you understand and can help the patient follow any healthcare provider instructions for all care.  Provide for the patient's basic needs You should help the patient with basic needs in the home and provide support for getting groceries, prescriptions, and other personal needs.  Monitor the patient's symptoms If they are getting sicker, call his or her medical provider and tell them that the patient has, or is being evaluated for, COVID-19 infection. This will help the healthcare provider's office  take steps to keep other people from getting infected. Ask the healthcare provider to call the local or state health department.  Limit the number of people who have contact with the patient  If possible, have only one caregiver for the patient.  Other  household members should stay in another home or place of residence. If this is not possible, they should stay  in another room, or be separated from the patient as much as possible. Use a separate bathroom, if available.  Restrict visitors who do not have an essential need to be in the home.  Keep older adults, very young children, and other sick people away from the patient Keep older adults, very young children, and those who have compromised immune systems or chronic health conditions away from the patient. This includes people with chronic heart, lung, or kidney conditions, diabetes, and cancer.  Ensure good ventilation Make sure that shared spaces in the home have good air flow, such as from an air conditioner or an opened window, weather permitting.  Wash your hands often  Wash your hands often and thoroughly with soap and water for at least 20 seconds. You can use an alcohol based hand sanitizer if soap and water are not available and if your hands are not visibly dirty.  Avoid touching your eyes, nose, and mouth with unwashed hands.  Use disposable paper towels to dry your hands. If not available, use dedicated cloth towels and replace them when they become wet.  Wear a facemask and gloves  Wear a disposable facemask at all times in the room and gloves when you touch or have contact with the patient's blood, body fluids, and/or secretions or excretions, such as sweat, saliva, sputum, nasal mucus, vomit, urine, or feces.  Ensure the mask fits over your nose and mouth tightly, and do not touch it during use.  Throw out disposable facemasks and gloves after using them. Do not reuse.  Wash your hands immediately after removing your facemask and gloves.  If your personal clothing becomes contaminated, carefully remove clothing and launder. Wash your hands after handling contaminated clothing.  Place all used disposable facemasks, gloves, and other waste in a lined container before  disposing them with other household waste.  Remove gloves and wash your hands immediately after handling these items.  Do not share dishes, glasses, or other household items with the patient  Avoid sharing household items. You should not share dishes, drinking glasses, cups, eating utensils, towels, bedding, or other items with a patient who is confirmed to have, or being evaluated for, COVID-19 infection.  After the person uses these items, you should wash them thoroughly with soap and water.  Wash laundry thoroughly  Immediately remove and wash clothes or bedding that have blood, body fluids, and/or secretions or excretions, such as sweat, saliva, sputum, nasal mucus, vomit, urine, or feces, on them.  Wear gloves when handling laundry from the patient.  Read and follow directions on labels of laundry or clothing items and detergent. In general, wash and dry with the warmest temperatures recommended on the label.  Clean all areas the individual has used often  Clean all touchable surfaces, such as counters, tabletops, doorknobs, bathroom fixtures, toilets, phones, keyboards, tablets, and bedside tables, every day. Also, clean any surfaces that may have blood, body fluids, and/or secretions or excretions on them.  Wear gloves when cleaning surfaces the patient has come in contact with.  Use a diluted bleach solution (e.g., dilute bleach with 1 part  bleach and 10 parts water) or a household disinfectant with a label that says EPA-registered for coronaviruses. To make a bleach solution at home, add 1 tablespoon of bleach to 1 quart (4 cups) of water. For a larger supply, add  cup of bleach to 1 gallon (16 cups) of water.  Read labels of cleaning products and follow recommendations provided on product labels. Labels contain instructions for safe and effective use of the cleaning product including precautions you should take when applying the product, such as wearing gloves or eye protection  and making sure you have good ventilation during use of the product.  Remove gloves and wash hands immediately after cleaning.  Monitor yourself for signs and symptoms of illness Caregivers and household members are considered close contacts, should monitor their health, and will be asked to limit movement outside of the home to the extent possible. Follow the monitoring steps for close contacts listed on the symptom monitoring form.   ? If you have additional questions, contact your local health department or call the epidemiologist on call at [phone] (available 24/7). ? This guidance is subject to change. For the most up-to-date guidance from [facility], please refer to their website: [email or web address]

## 2018-10-04 ENCOUNTER — Ambulatory Visit: Payer: Medicare Other | Admitting: Internal Medicine

## 2018-11-20 ENCOUNTER — Telehealth: Payer: Self-pay | Admitting: Internal Medicine

## 2018-11-20 NOTE — Telephone Encounter (Signed)
Patient stated that he has been purchasing his Diabetic testing supplies over the counter from [facility]. He stated that since he has turned 25 his medicare will cover his testing supplies. He is needing a script sent into his pharmacy for the meter,strips and lancets.   Patient stated right now he is using the [facility] brand and 33 gauge lancets.    [facility] [place]   Patient's C/B # [phone]

## 2018-11-20 NOTE — Telephone Encounter (Signed)
Spoke to pt. Advised him that we need to know what brand of supplies his insurance covers. He said he will find out and call back.

## 2018-11-21 MED ORDER — ONETOUCH VERIO VI STRP: ORAL_STRIP | 12 refills | Status: DC

## 2018-11-21 MED ORDER — ONETOUCH DELICA LANCETS 33G MISC: 1.0000 | Freq: Every day | 3 refills | Status: DC

## 2018-11-21 MED ORDER — ONETOUCH DELICA LANCING DEV MISC: 0 refills | Status: DC

## 2018-11-21 MED ORDER — ONETOUCH VERIO W/DEVICE KIT: 1.0000 | PACK | Freq: Once | 0 refills | Status: AC

## 2018-11-21 NOTE — Telephone Encounter (Signed)
I did rx for One Touch Verio supples

## 2018-11-21 NOTE — Addendum Note (Signed)
Addended by: Pilar Grammes on: 11/21/2018 11:59 AM   Modules accepted: Orders

## 2018-11-21 NOTE — Telephone Encounter (Signed)
Patient called back and stated that he spoke with his insurance.  Medicare stated that there is no brand restrictions for his testing supplies,   Patient's C/B # [phone]   [facility] [place]

## 2019-01-02 ENCOUNTER — Telehealth: Payer: Self-pay

## 2019-01-02 NOTE — Telephone Encounter (Signed)
Spoke to patient's wife and scheduled high dose flu shot.

## 2019-01-02 NOTE — Telephone Encounter (Signed)
Mrs Marlett [facility] signed) left v/m requesting cb to schedule flu shot.(? High dose flu shot). Mrs Staggs said pt has not met his medicare part B deductible and Mrs Waggle wants to know if medicare will still cover the cost of flu shot and administration. Mrs Tripodi said medicare was called and the response was "probably".

## 2019-01-09 ENCOUNTER — Other Ambulatory Visit: Payer: Self-pay | Admitting: Internal Medicine

## 2019-01-09 ENCOUNTER — Ambulatory Visit: Payer: Medicare Other

## 2019-01-09 NOTE — Telephone Encounter (Signed)
Last filled 11-12-18 #30 Last OV 03-24-18 Next OV [facility]-23-19 [facility] [address] [address]

## 2019-01-14 ENCOUNTER — Other Ambulatory Visit: Payer: Self-pay | Admitting: Internal Medicine

## 2019-01-18 ENCOUNTER — Telehealth: Payer: Self-pay | Admitting: Cardiovascular Disease

## 2019-01-18 NOTE — Telephone Encounter (Signed)
New Message    Patient would like to speak to you about appointment.  Please call patient back.

## 2019-01-22 NOTE — Telephone Encounter (Signed)
I spoke with pt who is calling to schedule one year follow up with Dr Angelena Form.  I scheduled this for December 17,2020 at 9:40

## 2019-02-13 ENCOUNTER — Ambulatory Visit (INDEPENDENT_AMBULATORY_CARE_PROVIDER_SITE_OTHER): Payer: Medicare Other

## 2019-02-13 ENCOUNTER — Other Ambulatory Visit: Payer: Self-pay

## 2019-02-13 DIAGNOSIS — Z23 Encounter for immunization: Secondary | ICD-10-CM | POA: Diagnosis not present

## 2019-02-23 ENCOUNTER — Ambulatory Visit (INDEPENDENT_AMBULATORY_CARE_PROVIDER_SITE_OTHER): Payer: Medicare Other | Admitting: Internal Medicine

## 2019-02-23 ENCOUNTER — Encounter: Payer: Self-pay | Admitting: Internal Medicine

## 2019-02-23 ENCOUNTER — Other Ambulatory Visit: Payer: Self-pay

## 2019-02-23 VITALS — BP 134/80 | HR 68 | Temp 97.0°F | Ht 68.5 in | Wt 162.0 lb

## 2019-02-23 DIAGNOSIS — K219 Gastro-esophageal reflux disease without esophagitis: Secondary | ICD-10-CM | POA: Diagnosis not present

## 2019-02-23 DIAGNOSIS — Z1211 Encounter for screening for malignant neoplasm of colon: Secondary | ICD-10-CM | POA: Diagnosis not present

## 2019-02-23 DIAGNOSIS — G479 Sleep disorder, unspecified: Secondary | ICD-10-CM | POA: Diagnosis not present

## 2019-02-23 DIAGNOSIS — I10 Essential (primary) hypertension: Secondary | ICD-10-CM

## 2019-02-23 DIAGNOSIS — Z0000 Encounter for general adult medical examination without abnormal findings: Secondary | ICD-10-CM | POA: Diagnosis not present

## 2019-02-23 DIAGNOSIS — Z7189 Other specified counseling: Secondary | ICD-10-CM | POA: Diagnosis not present

## 2019-02-23 DIAGNOSIS — E1159 Type 2 diabetes mellitus with other circulatory complications: Secondary | ICD-10-CM

## 2019-02-23 DIAGNOSIS — I2510 Atherosclerotic heart disease of native coronary artery without angina pectoris: Secondary | ICD-10-CM

## 2019-02-23 LAB — CBC
HCT: 44 % (ref 39.0–52.0)
Hemoglobin: 15.4 g/dL (ref 13.0–17.0)
MCHC: 35 g/dL (ref 30.0–36.0)
MCV: 91.3 fl (ref 78.0–100.0)
Platelets: 155 10*3/uL (ref 150.0–400.0)
RBC: 4.82 Mil/uL (ref 4.22–5.81)
RDW: 13.7 % (ref 11.5–15.5)
WBC: 6.7 10*3/uL (ref 4.0–10.5)

## 2019-02-23 LAB — LIPID PANEL
Cholesterol: 122 mg/dL (ref 0–200)
HDL: 28.4 mg/dL — ABNORMAL LOW (ref >39.00)
NonHDL: 93.24
Total CHOL/HDL Ratio: 4
Triglycerides: 230 mg/dL — ABNORMAL HIGH (ref 0.0–149.0)
VLDL: 46 mg/dL — ABNORMAL HIGH (ref 0.0–40.0)

## 2019-02-23 LAB — COMPREHENSIVE METABOLIC PANEL
ALT: 57 U/L — ABNORMAL HIGH (ref 0–53)
AST: 18 U/L (ref 0–37)
Albumin: 4.8 g/dL (ref 3.5–5.2)
Alkaline Phosphatase: 58 U/L (ref 39–117)
BUN: 17 mg/dL (ref 6–23)
CO2: 27 mEq/L (ref 19–32)
Calcium: 9.3 mg/dL (ref 8.4–10.5)
Chloride: 104 mEq/L (ref 96–112)
Creatinine, Ser: 1.16 mg/dL (ref 0.40–1.50)
GFR: 63.12 mL/min (ref >60.00)
Glucose, Bld: 106 mg/dL — ABNORMAL HIGH (ref 70–99)
Potassium: 4.7 mEq/L (ref 3.5–5.1)
Sodium: 139 mEq/L (ref 135–145)
Total Bilirubin: 0.6 mg/dL (ref 0.2–1.2)
Total Protein: 7 g/dL (ref 6.0–8.3)

## 2019-02-23 LAB — LDL CHOLESTEROL, DIRECT: Direct LDL: 67 mg/dL

## 2019-02-23 LAB — HEMOGLOBIN A1C: Hgb A1c MFr Bld: 5.8 % (ref 4.6–6.5)

## 2019-02-23 NOTE — Assessment & Plan Note (Signed)
Quiet on the PPI 

## 2019-02-23 NOTE — Progress Notes (Signed)
Hearing Screening   Method: Audiometry   125Hz  250Hz  500Hz  1000Hz  2000Hz  3000Hz  4000Hz  6000Hz  8000Hz   Right ear:   20 20 20   0    Left ear:   20 20 25   0      Visual Acuity Screening   Right eye Left eye Both eyes  Without correction: 20/25 20/25 20/25   With correction:

## 2019-02-23 NOTE — Progress Notes (Signed)
Subjective:    Patient ID: Joseph [name], male    DOB: [birth year], 63 y.o.   MRN: [id number]  HPI Here for Welcome to Medicare visit and follow up of chronic health conditions Reviewed form and advanced directives Reviewed other doctors Very occasional drink of alcohol Past smoking but quit 10 years ago Things are back to very good in his marriage Vision and hearing are fine No falls Does walk regularly No depression or anhedonia Independent with instrumental ADLs No sig memory problems  Wife is using chia seeds in her smoothies Feels it may help her bowels He wonders about these with his known diverticulosis--discussed  Sugars running higher As high as 202 Usually 130 or below Careful about his eating No foot numbness but gets occasional throbbing at times Due for diabetic eye exam---delayed due to [place] aorta imaging Had CT scan in 2015---no aneurysm but does have known atherosclerosis  No chest pain or SOB Exercise tolerance is stable No dizziness or syncope No edema No palpitations  Rare heartburn on the omeprazole No dysphagia  Current Outpatient Medications on File Prior to Visit  Medication Sig Dispense Refill  . ALPRAZolam (XANAX) 0.5 MG tablet TAKE 0.5-1 TABLETS (0.25-0.5 MG TOTAL) BY MOUTH AT BEDTIME. 30 tablet 2  . aspirin (ASPIR-81) 81 MG EC tablet Take 81 mg by mouth daily. pm    . glucose blood (ONETOUCH VERIO) test strip Use to check blood sugar once a day. Dx Code E11.9 100 each 12  . ibuprofen (ADVIL,MOTRIN) 200 MG tablet Take 400 mg by mouth 2 (two) times daily as needed (pain).     Elmore Guise Devices (ONE TOUCH DELICA LANCING DEV) MISC Use to obtain blood sample for glucose Dx Code E11.9 1 each 0  . losartan (COZAAR) 50 MG tablet Take 1 tablet (50 mg total) by mouth daily. 90 tablet 3  . metFORMIN (GLUCOPHAGE-XR) 750 MG 24 hr tablet TAKE 1 TABLET (750 MG TOTAL) BY MOUTH DAILY WITH BREAKFAST. 90 tablet 3  . metoprolol succinate  (TOPROL-XL) 50 MG 24 hr tablet TAKE 1 TABLET (50 MG TOTAL) BY MOUTH DAILY. TAKE WITH OR IMMEDIATELY FOLLOWING A MEAL. 90 tablet 3  . nitroGLYCERIN (NITROSTAT) 0.4 MG SL tablet Place 1 tablet (0.4 mg total) under the tongue every 5 (five) minutes as needed for chest pain. 25 tablet 6  . Omega-3 Fatty Acids (FISH OIL) 1000 MG CAPS Take 1,000 mg by mouth daily.     Marland Kitchen omeprazole (PRILOSEC) 40 MG capsule TAKE 1 CAPSULE (40 MG TOTAL) BY MOUTH DAILY. 90 capsule 3  . OneTouch Delica Lancets [id number] MISC 1 each by Does not apply route daily. Use to obtain sample for glucose level Dx Code E11.9 100 each 3  . pravastatin (PRAVACHOL) 40 MG tablet TAKE 1 TABLET BY MOUTH DAILY. 90 tablet 2  . traZODone (DESYREL) 100 MG tablet TAKE 1.5 TABLETS (150 MG TOTAL) BY MOUTH AT BEDTIME AS NEEDED FOR SLEEP. 135 tablet 2  . cyclobenzaprine (FLEXERIL) 10 MG tablet Take 0.5-1 tablets (5-10 mg total) by mouth 3 (three) times daily as needed for muscle spasms. (Patient not taking: Reported on 02/23/2019) 30 tablet 1   No current facility-administered medications on file prior to visit.     Allergies  Allergen Reactions  . Zolpidem Other (See Comments)    hallucinate  . Cephalexin Hives  . Keflin [Cephalothin]     rash  . Latex Dermatitis    Bandages and adhesives/ poss to latex  .  Lisinopril Cough    REACTION: cough  . Zolpidem Tartrate     UNKNOWN  . Lamisil [Terbinafine] Rash    Rash/itchy    Past Medical History:  Diagnosis Date  . Arthritis    knees and hip  . CAD (coronary artery disease)    s/p emergent 4 V CABG (4/10) w 2/4 patent grafts by cath 08/11/09, patent LIMA to LAD & SVG to OM w moderate disease native RCA & occluded grafts to RCA and diagonal  . Chest pain    secondary to sternal non-union post CABg-now s/p sternotomy w repair 03/28/09 at [facility], dr. Lunette Stands  . Diverticulitis    with perforated colon  . Fatty liver    non-alcoholic  . GERD (gastroesophageal reflux disease)    with Schatzki  ring  . History of chicken pox   . HTN (hypertension)    controlled  . Hypercholesterolemia   . 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 Myocardial infarction ([place]) [id number]  . Nephrolithiasis   . Normal echocardiogram 5/10   EF 60, apical & apicallateral HK, PASP 81mmHg. Repeat at [facility] w reported ef of 40%.   . Pneumonia 2016   in past   . Restless leg syndrome   . Type 2 diabetes mellitus, controlled ([place]) 6/15   type 2    Past Surgical History:  Procedure Laterality Date  . CATARACT EXTRACTION W/PHACO Right 08/18/2016   Procedure: CATARACT EXTRACTION PHACO AND INTRAOCULAR LENS PLACEMENT ([place]) Right diabetic;  Surgeon: Leandrew Koyanagi, MD;  Location: [facility];  Service: Ophthalmology;  Laterality: Right;  Diabetic-oral meds poss Latex allergy  . CATARACT EXTRACTION W/PHACO Left 09/15/2016   Procedure: CATARACT EXTRACTION PHACO AND INTRAOCULAR LENS PLACEMENT ([place])  Left diabetic;  Surgeon: Leandrew Koyanagi, MD;  Location: [facility];  Service: Ophthalmology;  Laterality: Left;  Diabetic - oral meds  . CORONARY ANGIOPLASTY  2003   stentx2  . CORONARY ARTERY BYPASS GRAFT  08/16/2008   x4  . ESOPHAGEAL DILATION  ~2009   Dr Earlean Shawl  . ILEOSTOMY  2014   with reversal-- after diverticulitis. Sigmoid colectomy also. Dr Ely/Bird  . INGUINAL HERNIA REPAIR Left x2   Dr Ernst Spell  . pilonidal sinus tract excision    . STERNUM SEPARATION REPAIR  03/2009   [place]  . VASECTOMY      Family History  Problem Relation Age of Onset  . Heart failure Mother   . Diabetes Mother   . Cancer Mother        ovarian?  . Heart disease Mother   . Congestive Heart Failure Mother        cause of death  . Heart attack Father   . Heart attack Brother   . Diabetes Brother   . Heart attack Brother   . Diabetes Brother   . Heart attack Sister   . Diabetes Sister   . Cancer Maternal Aunt        died of breast cancer   . Cancer Maternal Grandmother        died of bone cancer    Social History   Socioeconomic History  . Marital status: Married    Spouse name: Not on file  . Number of children: 3  . Years of education: Not on file  . Highest education level: Not on file  Occupational History  . Occupation: Animator    Comment: Disabled  . Occupation: Florist delivery    Comment:  part time  Social Needs  . Financial resource strain: Not on file  . Food insecurity    Worry: Not on file    Inability: Not on file  . Transportation needs    Medical: Not on file    Non-medical: Not on file  Tobacco Use  . Smoking status: Former Smoker    Packs/day: 0.50    Years: 37.00    Pack years: 18.50    Types: Cigarettes    Quit date: 07/18/2008    Years since quitting: 10.6  . Smokeless tobacco: Never Used  . Tobacco comment: No smoking since bypass in 4/10  Substance and Sexual Activity  . Alcohol use: Yes    Alcohol/week: 0.0 standard drinks    Comment: rarely  . Drug use: No  . Sexual activity: Not on file  Lifestyle  . Physical activity    Days per week: Not on file    Minutes per session: Not on file  . Stress: Not on file  Relationships  . Social Herbalist on phone: Not on file    Gets together: Not on file    Attends religious service: Not on file    Active member of club or organization: Not on file    Attends meetings of clubs or organizations: Not on file    Relationship status: Not on file  . Intimate partner violence    Fear of current or ex partner: Not on file    Emotionally abused: Not on file    Physically abused: Not on file    Forced sexual activity: Not on file  Other Topics Concern  . Not on file  Social History Narrative   No living will   Wife is health care POA   Would accept resuscitation   Not sure about tube feeds--probably not prolonged   Review of Systems He asks about safety with the extra sternal wires---like if gets CPR.  No action needed Appetite is good Weight up a few pounds Some chronic sleep issues---uses trazodone and 1/2 of the xanax generally Teeth are fine--no recent dental visit No suspicious skin lesions--does bruise easily Occasional headaches--behind left eye or frontal. Relates to sinus Bowels are fine--no blood Chronic back pain continues. Near shoulder and neck--as well as low back. Fairly regular ibuprofen use    Objective:   Physical Exam  Constitutional: He appears well-developed. No distress.  HENT:  Mouth/Throat: Oropharynx is clear and moist. No oropharyngeal exudate.  Neck: No thyromegaly present.  Cardiovascular: Normal rate, regular rhythm, normal heart sounds and intact distal pulses. Exam reveals no gallop.  No murmur heard. Respiratory: Effort normal and breath sounds normal. No respiratory distress. He has no wheezes. He has no rales.  GI: Soft. There is no abdominal tenderness.  Musculoskeletal:        General: No tenderness or edema.  Lymphadenopathy:    He has no cervical adenopathy.  Neurological:  Leretha Dykes" [id number] D-l-[id number] Recall 3/3  Normal sensation in feet  Skin: No rash noted. No erythema.  No foot lesions  Psychiatric: He has a normal mood and affect. His behavior is normal.           Assessment & Plan:

## 2019-02-23 NOTE — Assessment & Plan Note (Signed)
Still seems to have good control If A1c much above 6%--will double metformin

## 2019-02-23 NOTE — Assessment & Plan Note (Signed)
BP Readings from Last 3 Encounters:  02/23/19 134/80  03/24/18 134/82  03/09/18 114/76   Good control

## 2019-02-23 NOTE — Assessment & Plan Note (Signed)
No problems  Is on ARB, beta blocker, ASA, statin

## 2019-02-23 NOTE — Assessment & Plan Note (Signed)
Continues on the medications

## 2019-02-23 NOTE — Assessment & Plan Note (Signed)
See social history 

## 2019-02-23 NOTE — Assessment & Plan Note (Signed)
I have personally reviewed the Medicare Annual Wellness questionnaire and have noted 1. The patient's medical and social history 2. Their use of alcohol, tobacco or illicit drugs 3. Their current medications and supplements 4. The patient's functional ability including ADL's, fall risks, home safety risks and hearing or visual             impairment. 5. Diet and physical activities 6. Evidence for depression or mood disorders  The patients weight, height, BMI and visual acuity have been recorded in the chart I have made referrals, counseling and provided education to the patient based review of the above and I have provided the pt with a written personalized care plan for preventive services.  I have provided you with a copy of your personalized plan for preventive services. Please take the time to review along with your updated medication list.  Will update pneumovax in 2 years Had flu vaccine Due for colon Defer PSA to next year Discussed resistance training Consider shingrix

## 2019-03-25 ENCOUNTER — Other Ambulatory Visit: Payer: Self-pay | Admitting: Cardiovascular Disease

## 2019-03-27 MED ORDER — LOSARTAN POTASSIUM 50 MG PO TABS: 50.0000 mg | ORAL_TABLET | Freq: Every day | ORAL | 0 refills | Status: DC

## 2019-03-28 ENCOUNTER — Encounter: Payer: Self-pay | Admitting: Internal Medicine

## 2019-04-05 ENCOUNTER — Ambulatory Visit: Payer: Medicare Other | Admitting: Cardiovascular Disease

## 2019-04-06 ENCOUNTER — Other Ambulatory Visit: Payer: Self-pay | Admitting: Internal Medicine

## 2019-04-09 ENCOUNTER — Telehealth: Payer: Self-pay | Admitting: Cardiovascular Disease

## 2019-04-09 ENCOUNTER — Telehealth: Payer: Self-pay | Admitting: Internal Medicine

## 2019-04-09 NOTE — Telephone Encounter (Signed)
Spoke to pt. Advised him that Dr Silvio Pate thinks everyone should get the vaccine. It is personal preference for anyone to get it. That includes Dr Silvio Pate. But , Im sure he will be getting it. I advised him to keep watch on his MyChart messages as something may go out that way as to how [facility] is handling getting the vaccines to the general public.

## 2019-04-09 NOTE — Telephone Encounter (Signed)
Patient returned call.  Wanted to "get the green light" on getting Covid vaccine since he is 73 and has diabetes and had pneumonia in 2015. He has a call to his PCP as well to get their opinion.  I adv he is at higher risk of complication if he gets the virus, whereas with vaccine he is less likely to get symptoms.

## 2019-04-09 NOTE — Telephone Encounter (Signed)
Patient would like to speak with Fraser Din, Dr. Camillia Herter nurse, in regards to the covid-19 vaccine.

## 2019-04-09 NOTE — Telephone Encounter (Signed)
Patient called asking if Dr.Letvak recommends getting the covid vaccine and if Dr.Letvak will be getting the covid vaccine.  Patient wants to know when vaccine will be available/

## 2019-04-09 NOTE — Telephone Encounter (Signed)
Left message for patient to call back  

## 2019-04-23 ENCOUNTER — Ambulatory Visit: Payer: Medicare Other | Admitting: Cardiovascular Disease

## 2019-04-26 NOTE — Progress Notes (Signed)
Please set up a recall for 2-3 months to be sure he gets schedule

## 2019-05-03 ENCOUNTER — Other Ambulatory Visit: Payer: Self-pay | Admitting: Internal Medicine

## 2019-05-18 ENCOUNTER — Encounter: Payer: Medicare Other | Admitting: Internal Medicine

## 2019-05-22 ENCOUNTER — Telehealth: Payer: Self-pay | Admitting: Internal Medicine

## 2019-05-22 NOTE — Telephone Encounter (Signed)
He can continue the baby aspirin daily before the vaccine. It does increase the risk of minor bleeding---but no reason to stop beforehand

## 2019-05-22 NOTE — Telephone Encounter (Signed)
Spoke to pt

## 2019-05-22 NOTE — Telephone Encounter (Signed)
Patient stated that he is on the waiting list to receive his covid vaccine. He did hear on the news about not taking Ibuprofen before receiving the vaccine. Patient said that he is taking baby Asprin everyday and wanted to know if this is something he should not take before getting the vaccine or if there is any other medications on his list that he should hold off taking until after receiving the vaccine  Please advise

## 2019-06-01 DIAGNOSIS — Z23 Encounter for immunization: Secondary | ICD-10-CM | POA: Diagnosis not present

## 2019-06-17 ENCOUNTER — Other Ambulatory Visit: Payer: Self-pay | Admitting: Cardiovascular Disease

## 2019-06-20 ENCOUNTER — Telehealth: Payer: Self-pay | Admitting: Internal Medicine

## 2019-06-20 NOTE — Telephone Encounter (Signed)
Patient called to let Dr.Letvak know he received his 1st Moderna vaccine on 06/01/19 and the second vaccine will be on 06/29/19.

## 2019-06-29 DIAGNOSIS — Z23 Encounter for immunization: Secondary | ICD-10-CM | POA: Diagnosis not present

## 2019-07-01 ENCOUNTER — Other Ambulatory Visit: Payer: Self-pay | Admitting: Internal Medicine

## 2019-07-02 NOTE — Telephone Encounter (Signed)
Last filled 05-06-19 #30 Last OV 02-23-19 [facility]-19 [facility] [address] [address]

## 2019-07-05 ENCOUNTER — Telehealth: Payer: Self-pay | Admitting: Internal Medicine
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
## 2019-07-05 NOTE — Telephone Encounter (Signed)
No need to give extra aspirin I am not particularly concerned if his BP is low and he has no symptoms. I am not sure if the SOB could be related. If the BP stays that low, I would probably cut back on his metoprolol or losartan. I would recommend an in office evaluation and they should bring their BP cuff so we can compare and decide if a change in therapy is appropriate

## 2019-07-06 NOTE — Telephone Encounter (Signed)
Spoke to pt. He said he feels fine and his numbers went back up. He thinks he just overexerted. He will schedule an appt if it happens again

## 2019-07-15 ENCOUNTER — Other Ambulatory Visit: Payer: Self-pay | Admitting: Cardiovascular Disease

## 2019-07-15 ENCOUNTER — Other Ambulatory Visit: Payer: Self-pay | Admitting: Internal Medicine

## 2019-07-28 ENCOUNTER — Other Ambulatory Visit: Payer: Self-pay | Admitting: Internal Medicine

## 2019-07-30 NOTE — Telephone Encounter (Signed)
Received request from pharmacy  Alternative Requested:PT PAYING $15 FOR PRAVASTATIN. PT REQUESTING LOWER-COST ALTERNATIVE: ATORVASTATIN. $0; LOVASTATIN. $0; SIMVASTATIN. $0;

## 2019-07-30 NOTE — Telephone Encounter (Signed)
The atorvastatin is fine but I would recommend 20mg  daily  Okay #90 x 3

## 2019-08-02 ENCOUNTER — Telehealth: Payer: Self-pay | Admitting: Cardiovascular Disease

## 2019-08-02 NOTE — Telephone Encounter (Signed)
Patient would like to know if there is any additional paperwork that needs to be filled out for his appointment on 08/09/19 with Dr. Angelena Form. He states that he would like to fill those out before the appointment that way he does not have to spend extra time in the waiting room.

## 2019-08-02 NOTE — Telephone Encounter (Signed)
Informed patient there should be extra paperwork to fill out prior to appointment.

## 2019-08-08 ENCOUNTER — Encounter: Payer: Self-pay | Admitting: Internal Medicine

## 2019-08-09 ENCOUNTER — Ambulatory Visit (INDEPENDENT_AMBULATORY_CARE_PROVIDER_SITE_OTHER): Payer: Medicare Other | Admitting: Cardiovascular Disease

## 2019-08-09 ENCOUNTER — Other Ambulatory Visit: Payer: Self-pay | Admitting: Cardiovascular Disease

## 2019-08-09 ENCOUNTER — Other Ambulatory Visit: Payer: Self-pay

## 2019-08-09 ENCOUNTER — Encounter: Payer: Self-pay | Admitting: Cardiovascular Disease

## 2019-08-09 VITALS — BP 140/90 | HR 60 | Ht 68.5 in | Wt 165.0 lb

## 2019-08-09 DIAGNOSIS — I2510 Atherosclerotic heart disease of native coronary artery without angina pectoris: Secondary | ICD-10-CM

## 2019-08-09 DIAGNOSIS — I10 Essential (primary) hypertension: Secondary | ICD-10-CM

## 2019-08-09 MED ORDER — NITROGLYCERIN 0.4 MG SL SUBL: 0.4000 mg | SUBLINGUAL_TABLET | SUBLINGUAL | 6 refills | Status: DC | PRN

## 2019-08-09 NOTE — Progress Notes (Signed)
Chief Complaint  Patient presents with  . Follow-up    CAD   History of Present Illness: 64 yo male with history of CAD s/p 4V CABG, DM, GERD and nephrolithiasis who is here today for cardiac follow up. He was admitted to [facility] in April 2010 with an anterior STEMI. Emergent cath demonstrated a patent prior stent in the ostial LAD extending into the mid LAD. There was a hazy 99% stenosis in the mid LAD that was felt to be the culprit. PCI failed after multiple attempts, followed by ventricular fibrillation and the pt was taken emergently to the operating room for bypass. Emergent CABG was performed by Dr. Roxan Hockey with 4 vessels bypassed (LIMA to LAD, SVG to D2, SVG to OM2, SVG to distal RCA). After surgery, he had problems with chest wall pain and went to [facility] for evaluation by CT surgery. He underwent sternotomy with removal of sternal wires and revision by Dr. Lunette Stands on 03/28/09. He was admitted to [facility] April 2011 with chest pain. Cardiac cath repeated and showed patent LIMA to LAD, patent SVG to OM with occluded SVG to Diagonal and occluded SVG to RCA. EF was 45%.    He is here today for follow up. The patient denies any dyspnea, palpitations, lower extremity edema, orthopnea, PND, dizziness, near syncope or syncope. BP good at home. He has occasional mild chest pain. Lasts for several seconds. No associated dyspnea or dizziness.   Primary Care Physician: Venia Carbon, MD  Past Medical History:  Diagnosis Date  . Arthritis    knees and hip  . CAD (coronary artery disease)    s/p emergent 4 V CABG (4/10) w 2/4 patent grafts by cath 08/11/09, patent LIMA to LAD & SVG to OM w moderate disease native RCA & occluded grafts to RCA and diagonal  . Chest pain    secondary to sternal non-union post CABg-now s/p sternotomy w repair 03/28/09 at [facility], dr. Lunette Stands  . Diverticulitis    with perforated colon  . Fatty liver    non-alcoholic  . GERD (gastroesophageal reflux  disease)    with Schatzki ring  . History of chicken pox   . HTN (hypertension)    controlled  . Hypercholesterolemia   . 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 Myocardial infarction ([place]) [id number]  . Nephrolithiasis   . Normal echocardiogram 5/10   EF 60, apical & apicallateral HK, PASP 24mmHg. Repeat at [facility] w reported ef of 40%.   . Pneumonia 2016   in past   . Restless leg syndrome   . Type 2 diabetes mellitus, controlled ([place]) 6/15   type 2    Past Surgical History:  Procedure Laterality Date  . CATARACT EXTRACTION W/PHACO Right 08/18/2016   Procedure: CATARACT EXTRACTION PHACO AND INTRAOCULAR LENS PLACEMENT ([place]) Right diabetic;  Surgeon: Leandrew Koyanagi, MD;  Location: [place];  Service: Ophthalmology;  Laterality: Right;  Diabetic-oral meds poss Latex allergy  . CATARACT EXTRACTION W/PHACO Left 09/15/2016   Procedure: CATARACT EXTRACTION PHACO AND INTRAOCULAR LENS PLACEMENT ([place])  Left diabetic;  Surgeon: Leandrew Koyanagi, MD;  Location: [place];  Service: Ophthalmology;  Laterality: Left;  Diabetic - oral meds  . CORONARY ANGIOPLASTY  2003   stentx2  . CORONARY ARTERY BYPASS GRAFT  08/16/2008   x4  . ESOPHAGEAL DILATION  ~2009   Dr Earlean Shawl  . ILEOSTOMY  2014   with reversal-- after diverticulitis. Sigmoid colectomy also. Dr Ely/Bird  . INGUINAL  HERNIA REPAIR Left x2   Dr Ernst Spell  . pilonidal sinus tract excision    . STERNUM SEPARATION REPAIR  03/2009   [place]  . VASECTOMY      Current Outpatient Medications  Medication Sig Dispense Refill  . ALPRAZolam (XANAX) 0.5 MG tablet TAKE 0.5-1 TABLETS (0.25-0.5 MG TOTAL) BY MOUTH AT BEDTIME. 30 tablet 0  . aspirin (ASPIR-81) 81 MG EC tablet Take 81 mg by mouth daily. pm    . cyclobenzaprine (FLEXERIL) 10 MG tablet Take 0.5-1 tablets (5-10 mg total) by mouth 3 (three) times daily as needed for muscle spasms. 30 tablet 1  . glucose  blood (ONETOUCH VERIO) test strip Use to check blood sugar once a day. Dx Code E11.9 100 each 12  . ibuprofen (ADVIL,MOTRIN) 200 MG tablet Take 400 mg by mouth 2 (two) times daily as needed (pain).     Elmore Guise Devices (ONE TOUCH DELICA LANCING DEV) MISC Use to obtain blood sample for glucose Dx Code E11.9 1 each 0  . losartan (COZAAR) 50 MG tablet Take 1 tablet (50 mg total) by mouth daily. Please keep upcoming appt with Dr. Angelena Form in April before anymore refills. Final Attempt 30 tablet 0  . metFORMIN (GLUCOPHAGE-XR) 750 MG 24 hr tablet TAKE 1 TABLET (750 MG TOTAL) BY MOUTH DAILY WITH BREAKFAST. 90 tablet 3  . metoprolol succinate (TOPROL-XL) 50 MG 24 hr tablet TAKE 1 TABLET (50 MG TOTAL) BY MOUTH DAILY. TAKE WITH OR IMMEDIATELY FOLLOWING A MEAL. 90 tablet 3  . nitroGLYCERIN (NITROSTAT) 0.4 MG SL tablet Place 1 tablet (0.4 mg total) under the tongue every 5 (five) minutes as needed for chest pain. 25 tablet 6  . Omega-3 Fatty Acids (FISH OIL) 1000 MG CAPS Take 1,000 mg by mouth daily.     Marland Kitchen omeprazole (PRILOSEC) 40 MG capsule TAKE 1 CAPSULE (40 MG TOTAL) BY MOUTH DAILY. 90 capsule 3  . OneTouch Delica Lancets 99991111 MISC 1 each by Does not apply route daily. Use to obtain sample for glucose level Dx Code E11.9 100 each 3  . traZODone (DESYREL) 100 MG tablet TAKE 1.5 TABLETS (150 MG TOTAL) BY MOUTH AT BEDTIME AS NEEDED FOR SLEEP. 135 tablet 3  . atorvastatin (LIPITOR) 20 MG tablet Take 1 tablet (20 mg total) by mouth daily. (Patient not taking: Reported on 08/09/2019) 90 tablet 3   No current facility-administered medications for this visit.    Allergies  Allergen Reactions  . Zolpidem Other (See Comments)    hallucinate  . Cephalexin Hives  . Keflin [Cephalothin]     rash  . Latex Dermatitis    Bandages and adhesives/ poss to latex  . Lisinopril Cough    REACTION: cough  . Zolpidem Tartrate     UNKNOWN  . Lamisil [Terbinafine] Rash    Rash/itchy    History   Social History  .  Marital Status: Married    Spouse Name: N/A    Number of Children: 3  . Years of Education: N/A   Occupational History  . Not on file.   Social History Main Topics  . Smoking status: Former Smoker    Quit date: 07/18/2008  . Smokeless tobacco: Not on file     Comment: No smoking since bypass in 4/10  . Alcohol Use: No       . Drug Use: No  . Sexually Active: Not on file   Other Topics Concern  . Not on file   Social  History Narrative       Family History  Problem Relation Age of Onset  . Heart failure Mother   . Diabetes Mother   . Cancer Mother        ovarian?  . Heart disease Mother   . Congestive Heart Failure Mother        cause of death  . Heart attack Father   . Heart attack Brother   . Diabetes Brother   . Heart attack Brother   . Diabetes Brother   . Heart attack Sister   . Diabetes Sister   . Cancer Maternal Aunt        died of breast cancer  . Cancer Maternal Grandmother        died of bone cancer    Review of Systems:  As stated in the HPI and otherwise negative.   BP 140/90   Pulse 60   Ht 5' 8.5" (1.74 m)   Wt 165 lb (74.8 kg)   SpO2 98%   BMI 24.72 kg/m   Physical Examination:  General: Well developed, well nourished, NAD  HEENT: OP clear, mucus membranes moist  SKIN: warm, dry. No rashes. Neuro: No focal deficits  Musculoskeletal: Muscle strength 5/5 all ext  Psychiatric: Mood and affect normal  Neck: No JVD, no carotid bruits, no thyromegaly, no lymphadenopathy.  Lungs:Clear bilaterally, no wheezes, rhonci, crackles Cardiovascular: Regular rate and rhythm. No murmurs, gallops or rubs. Abdomen:Soft. Bowel sounds present. Non-tender.  Extremities: No lower extremity edema. Pulses are 2 + in the bilateral DP/PT.  EKG:  EKG is ordered today. The ekg ordered today demonstrates  Sinus  Recent Labs: 02/23/2019: ALT 57; BUN 17; Creatinine, Ser 1.16; Hemoglobin 15.4; Platelets 155.0; Potassium 4.7; Sodium 139   Lipid Panel     Component Value Date/Time   CHOL 122 02/23/2019 1228   CHOL 92 09/14/2012 1521   TRIG 230.0 (H) 02/23/2019 1228   TRIG 230 (H) 09/14/2012 1521   HDL 28.40 (L) 02/23/2019 1228   HDL 22 (L) 09/14/2012 1521   CHOLHDL 4 02/23/2019 1228   VLDL 46.0 (H) 02/23/2019 1228   VLDL 46 (H) 09/14/2012 1521   LDLCALC 58 09/16/2017 0932   LDLCALC 24 09/14/2012 1521   LDLDIRECT 67.0 02/23/2019 1228     Wt Readings from Last 3 Encounters:  08/09/19 165 lb (74.8 kg)  02/23/19 162 lb (73.5 kg)  03/24/18 165 lb (74.8 kg)     Other studies Reviewed: Additional studies/ records that were reviewed today include: . Review of the above records demonstrates:   Assessment and Plan:   1. CAD s/p CABG without angina: He is resting chest pain. No exertional pain. Will arrange an exercise stress test. Will arrange echo to assess LV systolic function. Continue ASA, statin and beta blocker.   2. Tobacco abuse, in remission: No tobacco use since 2010.   3. HTN: BP is well controlled at home. Continue current therapy  Current medicines are reviewed at length with the patient today.  The patient does not have concerns regarding medicines.  The following changes have been made:  no change  Labs/ tests ordered today include:   Orders Placed This Encounter  Procedures  . EXERCISE TOLERANCE TEST (ETT)  . EKG 12-Lead  . ECHOCARDIOGRAM COMPLETE    Disposition:   FU with me in 12 months  Signed, [name], MD 08/09/2019 12:19 PM    [facility] [place], [place], [place]  [address] Phone: [phone])  [phone]; Fax: [phone]

## 2019-08-09 NOTE — Patient Instructions (Signed)
Medication Instructions:  No changes *If you need a refill on your cardiac medications before your next appointment, please call your pharmacy*   Lab Work: none If you have labs (blood work) drawn today and your tests are completely normal, you will receive your results only by: Marland Kitchen MyChart Message (if you have MyChart) OR . A paper copy in the mail If you have any lab test that is abnormal or we need to change your treatment, we will call you to review the results.   Testing/Procedures: Your physician has requested that you have an echocardiogram. Echocardiography is a painless test that uses sound waves to create images of your heart. It provides your doctor with information about the size and shape of your heart and how well your heart's chambers and valves are working. This procedure takes approximately one hour. There are no restrictions for this procedure.  Your physician has requested that you have an exercise tolerance test. For further information please visit [email or web address]. Please also follow instruction sheet, as given.   Follow-Up: At [facility], you and your health needs are our priority.  As part of our continuing mission to provide you with exceptional heart care, we have created designated Provider Care Teams.  These Care Teams include your primary Cardiologist (physician) and Advanced Practice Providers (APPs -  Physician Assistants and Nurse Practitioners) who all work together to provide you with the care you need, when you need it.  Your next appointment:   12 month(s)  The format for your next appointment:   In Person  Provider:   You may see [name], MD or one of the following Advanced Practice Providers on your designated Care Team:    Melina Copa, PA-C  Ermalinda Barrios, PA-C   Other Instructions

## 2019-08-19 ENCOUNTER — Other Ambulatory Visit: Payer: Self-pay | Admitting: Internal Medicine

## 2019-08-20 NOTE — Telephone Encounter (Signed)
Last filled 07-02-19 #30 Last OV 02-23-19 Next OV 08-29-19 [facility] [address] [address]

## 2019-08-29 ENCOUNTER — Other Ambulatory Visit: Payer: Self-pay

## 2019-08-29 ENCOUNTER — Ambulatory Visit (INDEPENDENT_AMBULATORY_CARE_PROVIDER_SITE_OTHER): Payer: Medicare Other | Admitting: Internal Medicine

## 2019-08-29 ENCOUNTER — Encounter: Payer: Self-pay | Admitting: Internal Medicine

## 2019-08-29 VITALS — BP 122/84 | HR 67 | Temp 97.1°F | Ht 69.0 in | Wt 165.0 lb

## 2019-08-29 DIAGNOSIS — E1159 Type 2 diabetes mellitus with other circulatory complications: Secondary | ICD-10-CM | POA: Diagnosis not present

## 2019-08-29 DIAGNOSIS — G479 Sleep disorder, unspecified: Secondary | ICD-10-CM | POA: Diagnosis not present

## 2019-08-29 DIAGNOSIS — I10 Essential (primary) hypertension: Secondary | ICD-10-CM | POA: Diagnosis not present

## 2019-08-29 DIAGNOSIS — I2510 Atherosclerotic heart disease of native coronary artery without angina pectoris: Secondary | ICD-10-CM

## 2019-08-29 LAB — POCT GLYCOSYLATED HEMOGLOBIN (HGB A1C): Hemoglobin A1C: 5.7 % — AB (ref 4.0–5.6)

## 2019-08-29 LAB — HM DIABETES FOOT EXAM

## 2019-08-29 NOTE — Assessment & Plan Note (Signed)
BP Readings from Last 3 Encounters:  08/29/19 122/84  08/09/19 140/90  02/23/19 134/80   Good control on losartan and metoprolol

## 2019-08-29 NOTE — Assessment & Plan Note (Signed)
Lab Results  Component Value Date   HGBA1C 5.7 (A) 08/29/2019   Good control on just metformin Known CAD and HTN as well

## 2019-08-29 NOTE — Assessment & Plan Note (Signed)
Now with some easy DOE (on steps especially) Awaiting stress test and echo now

## 2019-08-29 NOTE — Progress Notes (Signed)
Subjective:    Patient ID: Joseph [name], male    DOB: 03-Jul-1955, 64 y.o.   MRN: [id number]  HPI Here for follow up of diabetes and other chronic health conditions This visit occurred during the SARS-CoV-2 public health emergency.  Safety protocols were in place, including screening questions prior to the visit, additional usage of staff PPE, and extensive cleaning of exam room while observing appropriate contact time as indicated for disinfecting solutions.   Has minor issues with fatigue Thinks it is due to age Notes more DOE--like running up steps Will have echo and stress test---already ordered  Checks sugars daily --- per wife Okay to check once or twice a week  Usually 120-140 No sig hypoglycemia (had 63 once with mild symptoms)  Current Outpatient Medications on File Prior to Visit  Medication Sig Dispense Refill  . ALPRAZolam (XANAX) 0.5 MG tablet TAKE 1/2 -1 TABLETS (0.25-0.5 MG TOTAL) BY MOUTH AT BEDTIME. 30 tablet 0  . aspirin (ASPIR-81) 81 MG EC tablet Take 81 mg by mouth daily. pm    . atorvastatin (LIPITOR) 20 MG tablet Take 1 tablet (20 mg total) by mouth daily. 90 tablet 3  . cyclobenzaprine (FLEXERIL) 10 MG tablet Take 0.5-1 tablets (5-10 mg total) by mouth 3 (three) times daily as needed for muscle spasms. 30 tablet 1  . glucose blood (ONETOUCH VERIO) test strip Use to check blood sugar once a day. Dx Code E11.9 100 each 12  . ibuprofen (ADVIL,MOTRIN) 200 MG tablet Take 400 mg by mouth 2 (two) times daily as needed (pain).     Elmore Guise Devices (ONE TOUCH DELICA LANCING DEV) MISC Use to obtain blood sample for glucose Dx Code E11.9 1 each 0  . losartan (COZAAR) 50 MG tablet TAKE 1 TABLET BY MOUTH EVERY DAY 90 tablet 3  . metFORMIN (GLUCOPHAGE-XR) 750 MG 24 hr tablet TAKE 1 TABLET (750 MG TOTAL) BY MOUTH DAILY WITH BREAKFAST. 90 tablet 3  . metoprolol succinate (TOPROL-XL) 50 MG 24 hr tablet TAKE 1 TABLET (50 MG TOTAL) BY MOUTH DAILY. TAKE WITH OR IMMEDIATELY  FOLLOWING A MEAL. 90 tablet 3  . nitroGLYCERIN (NITROSTAT) 0.4 MG SL tablet Place 1 tablet (0.4 mg total) under the tongue every 5 (five) minutes as needed for chest pain. 25 tablet 6  . Omega-3 Fatty Acids (FISH OIL) 1000 MG CAPS Take 1,000 mg by mouth daily.     Marland Kitchen omeprazole (PRILOSEC) 40 MG capsule TAKE 1 CAPSULE (40 MG TOTAL) BY MOUTH DAILY. 90 capsule 3  . OneTouch Delica Lancets 99991111 MISC 1 each by Does not apply route daily. Use to obtain sample for glucose level Dx Code E11.9 100 each 3  . traZODone (DESYREL) 100 MG tablet TAKE 1.5 TABLETS (150 MG TOTAL) BY MOUTH AT BEDTIME AS NEEDED FOR SLEEP. 135 tablet 3  . [DISCONTINUED] pravastatin (PRAVACHOL) 40 MG tablet TAKE 1 TABLET BY MOUTH DAILY. 90 tablet 3   No current facility-administered medications on file prior to visit.    Allergies  Allergen Reactions  . Zolpidem Other (See Comments)    hallucinate  . Cephalexin Hives  . Keflin [Cephalothin]     rash  . Latex Dermatitis    Bandages and adhesives/ poss to latex  . Lisinopril Cough    REACTION: cough  . Zolpidem Tartrate     UNKNOWN  . Lamisil [Terbinafine] Rash    Rash/itchy    Past Medical History:  Diagnosis Date  . Arthritis    knees and  hip  . CAD (coronary artery disease)    s/p emergent 4 V CABG (4/10) w 2/4 patent grafts by cath 08/11/09, patent LIMA to LAD & SVG to OM w moderate disease native RCA & occluded grafts to RCA and diagonal  . Chest pain    secondary to sternal non-union post CABg-now s/p sternotomy w repair 03/28/09 at [facility], dr. Lunette Stands  . Diverticulitis    with perforated colon  . Fatty liver    non-alcoholic  . GERD (gastroesophageal reflux disease)    with Schatzki ring  . History of chicken pox   . HTN (hypertension)    controlled  . Hypercholesterolemia   . 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 Myocardial infarction ([place]) [id number]  . Nephrolithiasis   . Normal echocardiogram 5/10   EF 60, apical  & apicallateral HK, PASP 89mmHg. Repeat at [facility] w reported ef of 40%.   . Pneumonia 2016   in past   . Restless leg syndrome   . Type 2 diabetes mellitus, controlled ([place]) 6/15   type 2    Past Surgical History:  Procedure Laterality Date  . CATARACT EXTRACTION W/PHACO Right 08/18/2016   Procedure: CATARACT EXTRACTION PHACO AND INTRAOCULAR LENS PLACEMENT ([place]) Right diabetic;  Surgeon: Leandrew Koyanagi, MD;  Location: [place];  Service: Ophthalmology;  Laterality: Right;  Diabetic-oral meds poss Latex allergy  . CATARACT EXTRACTION W/PHACO Left 09/15/2016   Procedure: CATARACT EXTRACTION PHACO AND INTRAOCULAR LENS PLACEMENT ([place])  Left diabetic;  Surgeon: Leandrew Koyanagi, MD;  Location: [place];  Service: Ophthalmology;  Laterality: Left;  Diabetic - oral meds  . CORONARY ANGIOPLASTY  2003   stentx2  . CORONARY ARTERY BYPASS GRAFT  08/16/2008   x4  . ESOPHAGEAL DILATION  ~2009   Dr Earlean Shawl  . ILEOSTOMY  2014   with reversal-- after diverticulitis. Sigmoid colectomy also. Dr Ely/Bird  . INGUINAL HERNIA REPAIR Left x2   Dr Ernst Spell  . pilonidal sinus tract excision    . STERNUM SEPARATION REPAIR  03/2009   [place]  . VASECTOMY      Family History  Problem Relation Age of Onset  . Heart failure Mother   . Diabetes Mother   . Cancer Mother        ovarian?  . Heart disease Mother   . Congestive Heart Failure Mother        cause of death  . Heart attack Father   . Heart attack Brother   . Diabetes Brother   . Heart attack Brother   . Diabetes Brother   . Heart attack Sister   . Diabetes Sister   . Cancer Maternal Aunt        died of breast cancer  . Cancer Maternal Grandmother        died of bone cancer    Social History   Socioeconomic History  . Marital status: Married    Spouse name: Not on file  . Number of children: 3  . Years of education: Not on file  . Highest education level: Not on file  Occupational  History  . Occupation: Animator    Comment: Disabled  . Occupation: Florist delivery    Comment: part time  Tobacco Use  . Smoking status: Former Smoker    Packs/day: 0.50    Years: 37.00    Pack years: 18.50    Types: Cigarettes    Quit date: 07/18/2008    Years  since quitting: 11.1  . Smokeless tobacco: Never Used  . Tobacco comment: No smoking since bypass in 4/10  Substance and Sexual Activity  . Alcohol use: Yes    Alcohol/week: 0.0 standard drinks    Comment: rarely  . Drug use: No  . Sexual activity: Not on file  Other Topics Concern  . Not on file  Social History Narrative   Has living will   Wife is health care POA---alternate is daughter Ria Comment   Would accept resuscitation   Not sure about tube feeds--probably not prolonged   Social Determinants of Health   Financial Resource Strain:   . Difficulty of Paying Living Expenses:   Food Insecurity:   . Worried About Charity fundraiser in the Last Year:   . Arboriculturist in the Last Year:   Transportation Needs:   . Film/video editor (Medical):   Marland Kitchen Lack of Transportation (Non-Medical):   Physical Activity:   . Days of Exercise per Week:   . Minutes of Exercise per Session:   Stress:   . Feeling of Stress :   Social Connections:   . Frequency of Communication with Friends and Family:   . Frequency of Social Gatherings with Friends and Family:   . Attends Religious Services:   . Active Member of Clubs or Organizations:   . Attends Archivist Meetings:   Marland Kitchen Marital Status:   Intimate Partner Violence:   . Fear of Current or Ex-Partner:   . Emotionally Abused:   Marland Kitchen Physically Abused:   . Sexually Abused:    Review of Systems Has rescheduled his colonoscopy Ongoing insomnia--- takes 100mg  trazodone and 1/2 alprazolam Rarely uses the flexeril--if he puts his back out    Objective:   Physical Exam  Constitutional: He appears well-developed. No distress.  Neck: No  thyromegaly present.  Cardiovascular: Normal rate, regular rhythm, normal heart sounds and intact distal pulses. Exam reveals no gallop.  No murmur heard. Respiratory: Breath sounds normal. No respiratory distress. He has no wheezes. He has no rales.  Lymphadenopathy:    He has no cervical adenopathy.  Neurological:  Normal sensation in feet  Skin: No rash noted.  Not foot lesions--just slight plantar flaking           Assessment & Plan:

## 2019-08-29 NOTE — Assessment & Plan Note (Signed)
Does okay with trazodone and xanax

## 2019-08-31 ENCOUNTER — Other Ambulatory Visit (HOSPITAL_COMMUNITY)
Admission: RE | Admit: 2019-08-31 | Discharge: 2019-08-31 | Disposition: A | Discharge status: Home / Self Care | Payer: Medicare Other | Source: Ambulatory Visit | Attending: Cardiovascular Disease | Admitting: Cardiovascular Disease

## 2019-08-31 DIAGNOSIS — Z01812 Encounter for preprocedural laboratory examination: Secondary | ICD-10-CM | POA: Insufficient documentation

## 2019-08-31 DIAGNOSIS — Z20822 Contact with and (suspected) exposure to covid-19: Secondary | ICD-10-CM | POA: Diagnosis not present

## 2019-08-31 LAB — SARS CORONAVIRUS 2 (TAT 6-24 HRS): SARS Coronavirus 2: NEGATIVE

## 2019-09-04 ENCOUNTER — Other Ambulatory Visit: Payer: Self-pay

## 2019-09-04 ENCOUNTER — Ambulatory Visit (HOSPITAL_COMMUNITY): Payer: Medicare Other | Attending: Cardiovascular Disease

## 2019-09-04 ENCOUNTER — Ambulatory Visit (INDEPENDENT_AMBULATORY_CARE_PROVIDER_SITE_OTHER): Payer: Medicare Other

## 2019-09-04 DIAGNOSIS — I10 Essential (primary) hypertension: Secondary | ICD-10-CM

## 2019-09-04 DIAGNOSIS — I2510 Atherosclerotic heart disease of native coronary artery without angina pectoris: Secondary | ICD-10-CM

## 2019-09-05 ENCOUNTER — Telehealth: Payer: Self-pay | Admitting: Cardiovascular Disease

## 2019-09-05 DIAGNOSIS — I2510 Atherosclerotic heart disease of native coronary artery without angina pectoris: Secondary | ICD-10-CM

## 2019-09-05 DIAGNOSIS — R9430 Abnormal result of cardiovascular function study, unspecified: Secondary | ICD-10-CM

## 2019-09-05 DIAGNOSIS — I25810 Atherosclerosis of coronary artery bypass graft(s) without angina pectoris: Secondary | ICD-10-CM

## 2019-09-05 LAB — EXERCISE TOLERANCE TEST
Estimated workload: 10.1 METS
Exercise duration (min): 7 min
Exercise duration (sec): 4 s
MPHR: 155 {beats}/min
Peak HR: 155 {beats}/min
Percent HR: 100 %
RPE: 15
Rest HR: 74 {beats}/min

## 2019-09-05 NOTE — Telephone Encounter (Signed)
   Pt is calling to know echo and ETT results

## 2019-09-06 NOTE — Telephone Encounter (Signed)
-----   Message from Burnell Blanks, MD sent at 09/05/2019  8:15 AM EDT ----- Can we let Mr. Peabody know that his stress test showed slight EKG changes with exercise If he is willing, I would like to do a full nuclear exercise stress test to get images of his heart muscle during exercise. Gerald Stabs

## 2019-09-06 NOTE — Telephone Encounter (Signed)
Patient has been notified of results and recommendations for exercise nuclear stress test.  He is in agreement and aware he will be contacted to schedule.  Also aware of Covid screening and need to quarantine after screening.   Also spoke with patient's wife with his verbal permission and him in background.  Answered several questions/concerns re: results and testing.  Will call back if further questions.

## 2019-09-07 ENCOUNTER — Telehealth: Payer: Self-pay | Admitting: *Deleted

## 2019-09-07 ENCOUNTER — Ambulatory Visit (AMBULATORY_SURGERY_CENTER): Payer: Self-pay | Admitting: *Deleted

## 2019-09-07 ENCOUNTER — Other Ambulatory Visit: Payer: Self-pay

## 2019-09-07 VITALS — Ht 69.0 in | Wt 163.4 lb

## 2019-09-07 DIAGNOSIS — Z1211 Encounter for screening for malignant neoplasm of colon: Secondary | ICD-10-CM

## 2019-09-07 MED ORDER — SUTAB 1479-225-188 MG PO TABS: 24.0000 | ORAL_TABLET | ORAL | 0 refills | Status: DC

## 2019-09-07 NOTE — Telephone Encounter (Signed)
Yes. He will need to complete his cardiology evaluation and obtain cardiology clearance before rescheduling in the [place]. Please cancel his procedure and inform the patient. Thanks   Dr. Henrene Pastor

## 2019-09-07 NOTE — Progress Notes (Signed)
06-29-19 comp covid vaccines   No egg or soy allergy known to patient  No issues with past sedation with any surgeries  or procedures, no intubation problems  No diet pills per patient No home 02 use per patient  No blood thinners per patient  Pt denies issues with constipation  No A fib or A flutter  EMMI video sent to pt's e mail   09-04-19 Echo EF 55-60%, stress test + for ischemia- has a nuclear stress test scheduled for 09-28-2019- will send TE to [facility] and Dr Henrene Pastor - pt and wife aware may have to schedule for RS colon after testing   Due to the COVID-19 pandemic we are asking patients to follow these guidelines. Please only bring one care partner. Please be aware that your care partner may wait in the car in the parking lot or if they feel like they will be too hot to wait in the car, they may wait in the lobby on the 4th floor. All care partners are required to wear a mask the entire time (we do not have any that we can provide them), they need to practice social distancing, and we will do a Covid check for all patient's and care partners when you arrive. Also we will check their temperature and your temperature. If the care partner waits in their car they need to stay in the parking lot the entire time and we will call them on their cell phone when the patient is ready for discharge so they can bring the car to the front of the building. Also all patient's will need to wear a mask into building.

## 2019-09-07 NOTE — Telephone Encounter (Signed)
Dr Henrene Pastor and Jenny Reichmann,  I saw this patient in [place] today - he is scheduled for a screening colonosocpy 6-3- last colon 2010 normal, 2007 colon HPP x 1.  He has a hx of MI x 2, 1st 2003 and 2nd 2010-  He had a CABG x 4 in 2010- - he has ahx of diverticulitis with perforation in 2014 with a colectomy that has been reversed.   09-04-19 he had an  Echo- EF 55-60%,  Then a myoview stress test , + for ischemia at an adequate work load - has a nuclear stress test scheduled for 09-28-2019-  Pt states he had been having right side pain and with his MI hx, testing was ordered.  Do you want pt to RS his colon until after the Nuclear stress test? I explained to pt and wife in [place] this may need to happen and they both understood   He denies Chest pain, shortness of breath  Please advise, thanks so much, Lelan Pons

## 2019-09-07 NOTE — Telephone Encounter (Signed)
Called pt- LMTRC to Me

## 2019-09-07 NOTE — Telephone Encounter (Signed)
Pt informed will need to get cardiac clearance after nuclear stress and then he can CB to RS colon per dr Henrene Pastor - I canceled his 6-3 colon- pt and wife state will CB as soon as cardio states is safe to RS his colon- pt asked if he can pick up his sutab now- Instructed yes- go ahead and get sutab and once cleared we can RS , sutbas will be fine as have a long shelf life

## 2019-09-10 ENCOUNTER — Encounter (HOSPITAL_COMMUNITY): Payer: Self-pay | Admitting: *Deleted

## 2019-09-10 ENCOUNTER — Other Ambulatory Visit (HOSPITAL_COMMUNITY)
Admission: RE | Admit: 2019-09-10 | Discharge: 2019-09-10 | Disposition: A | Discharge status: Home / Self Care | Payer: Medicare Other | Source: Ambulatory Visit | Attending: Cardiovascular Disease | Admitting: Cardiovascular Disease

## 2019-09-10 DIAGNOSIS — Z01812 Encounter for preprocedural laboratory examination: Secondary | ICD-10-CM | POA: Diagnosis not present

## 2019-09-10 DIAGNOSIS — Z20822 Contact with and (suspected) exposure to covid-19: Secondary | ICD-10-CM | POA: Insufficient documentation

## 2019-09-10 LAB — SARS CORONAVIRUS 2 (TAT 6-24 HRS): SARS Coronavirus 2: NEGATIVE

## 2019-09-14 ENCOUNTER — Telehealth: Payer: Self-pay | Admitting: Internal Medicine

## 2019-09-14 ENCOUNTER — Ambulatory Visit (HOSPITAL_COMMUNITY): Payer: Medicare Other | Attending: Cardiovascular Disease

## 2019-09-14 ENCOUNTER — Other Ambulatory Visit: Payer: Self-pay

## 2019-09-14 ENCOUNTER — Ambulatory Visit (INDEPENDENT_AMBULATORY_CARE_PROVIDER_SITE_OTHER): Payer: Medicare Other | Admitting: Internal Medicine

## 2019-09-14 DIAGNOSIS — I472 Ventricular tachycardia, unspecified: Secondary | ICD-10-CM

## 2019-09-14 DIAGNOSIS — R9430 Abnormal result of cardiovascular function study, unspecified: Secondary | ICD-10-CM | POA: Insufficient documentation

## 2019-09-14 DIAGNOSIS — I2510 Atherosclerotic heart disease of native coronary artery without angina pectoris: Secondary | ICD-10-CM

## 2019-09-14 DIAGNOSIS — I25810 Atherosclerosis of coronary artery bypass graft(s) without angina pectoris: Secondary | ICD-10-CM | POA: Diagnosis not present

## 2019-09-14 LAB — MYOCARDIAL PERFUSION IMAGING
Estimated workload: 7 METS
Exercise duration (min): 6 min
Exercise duration (sec): 0 s
LV dias vol: 62 mL (ref 62–150)
LV sys vol: 28 mL
MPHR: 155 {beats}/min
Peak HR: 157 {beats}/min
Percent HR: 101 %
Rest HR: 100 {beats}/min
SDS: 3
SRS: 5
SSS: 9
TID: 0.89

## 2019-09-14 MED ORDER — TECHNETIUM TC 99M TETROFOSMIN IV KIT
31.6000 | PACK | Freq: Once | INTRAVENOUS | Status: AC | PRN
  Administered 2019-09-14: 31.6 via INTRAVENOUS
  Filled 2019-09-14: qty 32

## 2019-09-14 MED ORDER — TECHNETIUM TC 99M TETROFOSMIN IV KIT
10.3000 | PACK | Freq: Once | INTRAVENOUS | Status: AC | PRN
  Administered 2019-09-14: 10.3 via INTRAVENOUS
  Filled 2019-09-14: qty 11

## 2019-09-14 NOTE — Progress Notes (Signed)
Opened in error

## 2019-09-14 NOTE — H&P (View-Only) (Signed)
Improving as noted     Patient Care Team: Venia Carbon, MD as PCP - General (Internal Medicine) Burnell Blanks, MD as PCP - Cardiology (Cardiology)   HPI  Joseph [name] is a 64 y.o. male Seen as an add-on today after he presented to nuclear medicine for stress testing for exertional and nonexertional chest discomfort.  And had a nondiagnostic standard stress test previously while on beta-blockers.  Beta-blockers were held for this procedure.  He developed nonsustained polymorphic ventricular tachycardia in the first minute of recovery without symptoms.  Images pending  Records and Results Reviewed   Past Medical History:  Diagnosis Date  . Allergy   . Arthritis    knees and hip  . CAD (coronary artery disease)    s/p emergent 4 V CABG (4/10) w 2/4 patent grafts by cath 08/11/09, patent LIMA to LAD & SVG to OM w moderate disease native RCA & occluded grafts to RCA and diagonal  . Chest pain    secondary to sternal non-union post CABg-now s/p sternotomy w repair 03/28/09 at [facility], dr. Lunette Stands  . Diverticulitis    with perforated colon  . Diverticulitis    2014  . Fatty liver    non-alcoholic  . GERD (gastroesophageal reflux disease)    with Schatzki ring  . History of chicken pox   . HTN (hypertension)    controlled  . Hypercholesterolemia   . 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 Myocardial infarction ([place]) [id number]  . Nephrolithiasis   . Normal echocardiogram 5/10   EF 60, apical & apicallateral HK, PASP 17mmHg. Repeat at [facility] w reported ef of 40%.   . Pneumonia 2016   in past   . Restless leg syndrome   . Schatzki's ring   . Type 2 diabetes mellitus, controlled ([place]) 6/15   type 2    Past Surgical History:  Procedure Laterality Date  . CATARACT EXTRACTION W/PHACO Right 08/18/2016   Procedure: CATARACT EXTRACTION PHACO AND INTRAOCULAR LENS PLACEMENT ([place]) Right diabetic;  Surgeon: Leandrew Koyanagi, MD;   Location: [place];  Service: Ophthalmology;  Laterality: Right;  Diabetic-oral meds poss Latex allergy  . CATARACT EXTRACTION W/PHACO Left 09/15/2016   Procedure: CATARACT EXTRACTION PHACO AND INTRAOCULAR LENS PLACEMENT ([place])  Left diabetic;  Surgeon: Leandrew Koyanagi, MD;  Location: [place];  Service: Ophthalmology;  Laterality: Left;  Diabetic - oral meds  . COLON SURGERY  2014  . COLONOSCOPY  [id number]   2010 colon normal- 2003 HPP x 1   . CORONARY ANGIOPLASTY  2003   stentx2  . CORONARY ARTERY BYPASS GRAFT  08/16/2008   x4  . ESOPHAGEAL DILATION  ~2009   Dr Earlean Shawl  . ILEOSTOMY  2014   with reversal-- after diverticulitis. Sigmoid colectomy also. Dr Ely/Bird  . INGUINAL HERNIA REPAIR Left x2   Dr Ernst Spell  . pilonidal sinus tract excision    . POLYPECTOMY  2007   HPP x 1   . STERNUM SEPARATION REPAIR  03/2009   [place]  . UPPER GASTROINTESTINAL ENDOSCOPY    . VASECTOMY      No outpatient medications have been marked as taking for the 09/14/19 encounter (Appointment) with Deboraha Sprang, MD.    Allergies  Allergen Reactions  . Zolpidem Other (See Comments)    hallucinate  . Cephalexin Hives  . Keflin [Cephalothin]     rash  . Latex Dermatitis    Bandages and adhesives/ poss  to latex  . Lisinopril Cough    REACTION: cough  . Zolpidem Tartrate     UNKNOWN  . Lamisil [Terbinafine] Rash    Rash/itchy      Review of Systems negative except from HPI and PMH  Physical Exam For vital signs see the flow sheet  well developed and well nourished in no acute distress HENT normal E scleral and icterus clear Neck Supple JVP flat; carotids brisk and full Clear to ausculation Regular rate and rhythm, no murmurs gallops or rub Soft with active bowel sounds No clubbing cyanosis  Edema Alert and oriented, grossly normal motor and sensory function Skin Warm and Dry  ECG VT PM emerging during immediate recovery assoc with  increase in VEA with monomorphic PVC*  CrCl cannot be calculated (Patient's most recent lab result is older than the maximum 21 days allowed.).   Assessment and  Plan  Ischemic heart disease with prior bypass surgery and interval revascularization  Exercise-induced polymorphic ventricular tachycardia   Given pretest probability of CAD and exercise assoc VT PM  Have recommended cath   I have reviewed with DR CM who will await images and call pt  He is to resume his BB on which he had no VT when submitted for plain exercise test    Current medicines are reviewed at length with the patient today .  The patient does not  have concerns regarding medicines.

## 2019-09-14 NOTE — Telephone Encounter (Signed)
Patient's wife states she has questions in regards to appointment scheduled for today, 09/14/19 at 11:30 AM with Dr. Caryl Comes. She states the patient was not made aware that he had an appointment scheduled for today. Please call.

## 2019-09-14 NOTE — Progress Notes (Signed)
Improving as noted     Patient Care Team: Venia Carbon, MD as PCP - General (Internal Medicine) Burnell Blanks, MD as PCP - Cardiology (Cardiology)   HPI  Joseph [name] is a 64 y.o. male Seen as an add-on today after he presented to nuclear medicine for stress testing for exertional and nonexertional chest discomfort.  And had a nondiagnostic standard stress test previously while on beta-blockers.  Beta-blockers were held for this procedure.  He developed nonsustained polymorphic ventricular tachycardia in the first minute of recovery without symptoms.  Images pending  Records and Results Reviewed   Past Medical History:  Diagnosis Date  . Allergy   . Arthritis    knees and hip  . CAD (coronary artery disease)    s/p emergent 4 V CABG (4/10) w 2/4 patent grafts by cath 08/11/09, patent LIMA to LAD & SVG to OM w moderate disease native RCA & occluded grafts to RCA and diagonal  . Chest pain    secondary to sternal non-union post CABg-now s/p sternotomy w repair 03/28/09 at [facility], dr. Lunette Stands  . Diverticulitis    with perforated colon  . Diverticulitis    2014  . Fatty liver    non-alcoholic  . GERD (gastroesophageal reflux disease)    with Schatzki ring  . History of chicken pox   . HTN (hypertension)    controlled  . Hypercholesterolemia   . 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 Myocardial infarction ([place]) [id number]  . Nephrolithiasis   . Normal echocardiogram 5/10   EF 60, apical & apicallateral HK, PASP 42mmHg. Repeat at [facility] w reported ef of 40%.   . Pneumonia 2016   in past   . Restless leg syndrome   . Schatzki's ring   . Type 2 diabetes mellitus, controlled ([place]) 6/15   type 2    Past Surgical History:  Procedure Laterality Date  . CATARACT EXTRACTION W/PHACO Right 08/18/2016   Procedure: CATARACT EXTRACTION PHACO AND INTRAOCULAR LENS PLACEMENT ([place]) Right diabetic;  Surgeon: Leandrew Koyanagi, MD;   Location: [place];  Service: Ophthalmology;  Laterality: Right;  Diabetic-oral meds poss Latex allergy  . CATARACT EXTRACTION W/PHACO Left 09/15/2016   Procedure: CATARACT EXTRACTION PHACO AND INTRAOCULAR LENS PLACEMENT ([place])  Left diabetic;  Surgeon: Leandrew Koyanagi, MD;  Location: [place];  Service: Ophthalmology;  Laterality: Left;  Diabetic - oral meds  . COLON SURGERY  2014  . COLONOSCOPY  [id number]   2010 colon normal- 2003 HPP x 1   . CORONARY ANGIOPLASTY  2003   stentx2  . CORONARY ARTERY BYPASS GRAFT  08/16/2008   x4  . ESOPHAGEAL DILATION  ~2009   Dr Earlean Shawl  . ILEOSTOMY  2014   with reversal-- after diverticulitis. Sigmoid colectomy also. Dr Ely/Bird  . INGUINAL HERNIA REPAIR Left x2   Dr Ernst Spell  . pilonidal sinus tract excision    . POLYPECTOMY  2007   HPP x 1   . STERNUM SEPARATION REPAIR  03/2009   [place]  . UPPER GASTROINTESTINAL ENDOSCOPY    . VASECTOMY      No outpatient medications have been marked as taking for the 09/14/19 encounter (Appointment) with Deboraha Sprang, MD.    Allergies  Allergen Reactions  . Zolpidem Other (See Comments)    hallucinate  . Cephalexin Hives  . Keflin [Cephalothin]     rash  . Latex Dermatitis    Bandages and adhesives/ poss  to latex  . Lisinopril Cough    REACTION: cough  . Zolpidem Tartrate     UNKNOWN  . Lamisil [Terbinafine] Rash    Rash/itchy      Review of Systems negative except from HPI and PMH  Physical Exam For vital signs see the flow sheet  well developed and well nourished in no acute distress HENT normal E scleral and icterus clear Neck Supple JVP flat; carotids brisk and full Clear to ausculation Regular rate and rhythm, no murmurs gallops or rub Soft with active bowel sounds No clubbing cyanosis  Edema Alert and oriented, grossly normal motor and sensory function Skin Warm and Dry  ECG VT PM emerging during immediate recovery assoc with  increase in VEA with monomorphic PVC*  CrCl cannot be calculated (Patient's most recent lab result is older than the maximum 21 days allowed.).   Assessment and  Plan  Ischemic heart disease with prior bypass surgery and interval revascularization  Exercise-induced polymorphic ventricular tachycardia   Given pretest probability of CAD and exercise assoc VT PM  Have recommended cath   I have reviewed with DR CM who will await images and call pt  He is to resume his BB on which he had no VT when submitted for plain exercise test    Current medicines are reviewed at length with the patient today .  The patient does not  have concerns regarding medicines.

## 2019-09-14 NOTE — Telephone Encounter (Signed)
Spoke with pt's wife, Butch Penny [facility]) who states she saw on pt's MyChart he had appointment with Dr Caryl Comes today at 1130.  Explained to pt's wife because Dr Caryl Comes saw pt in nuclear lab this am and he was added to Dr Olin Pia schedule as Dr Caryl Comes is doctor of the day.  Pt's wife advised Dr Caryl Comes has spoken with Dr Julianne Handler and he will be calling pt to discuss next steps.  Pt's wife verbalizes understanding and agrees with current plan

## 2019-09-18 NOTE — Addendum Note (Signed)
Addended by: Harland German A on: 09/18/2019 12:30 PM   Modules accepted: Orders

## 2019-09-18 NOTE — Telephone Encounter (Signed)
Patient's wife is requesting to speak with Valetta Fuller to follow up in regards to current plan. She states she would like confirmation. Please call.

## 2019-09-18 NOTE — Telephone Encounter (Signed)
Spoke with the patient, who states his wife is extremely anxious about precert for the catheterization next week.  Checked with precert- no authorization needed.  Spoke with the Billing department who will reach out to the patient earlier than usual to discuss payment.

## 2019-09-20 ENCOUNTER — Encounter: Payer: Medicare Other | Admitting: Internal Medicine

## 2019-09-21 NOTE — Addendum Note (Signed)
Addended by: [name] D on: 09/21/2019 12:03 PM   Modules accepted: Orders, SmartSet

## 2019-09-24 ENCOUNTER — Other Ambulatory Visit (HOSPITAL_COMMUNITY): Payer: Medicare Other

## 2019-09-24 ENCOUNTER — Telehealth: Payer: Self-pay | Admitting: Internal Medicine

## 2019-09-24 ENCOUNTER — Telehealth: Payer: Self-pay | Admitting: Cardiovascular Disease

## 2019-09-24 NOTE — Telephone Encounter (Signed)
I spoke with the patient's wife.  He was in the background speaking as well. She wanted to make sure he was not going to get adenosine during the cath because her friend had it during a test and it was awful. I adv her adenosine is not given as part of routine cath.  Several other questions/concerns were addressed, including if he has to stay overnight can his wife stay with him.  I directed them to call the hospital directly for most current visitor policy.

## 2019-09-24 NOTE — Telephone Encounter (Signed)
Patient's wife is calling in regards to whether or not HIV testing can be denied. Please call.

## 2019-09-24 NOTE — Telephone Encounter (Signed)
See other phone encounter from today.

## 2019-09-24 NOTE — Telephone Encounter (Signed)
New Message   Pts wife is calling with questions about the upcoming procedure   Please call back

## 2019-09-25 ENCOUNTER — Other Ambulatory Visit (HOSPITAL_COMMUNITY): Payer: Medicare Other

## 2019-09-26 ENCOUNTER — Other Ambulatory Visit: Payer: Self-pay

## 2019-09-26 ENCOUNTER — Other Ambulatory Visit (HOSPITAL_COMMUNITY)
Admission: RE | Admit: 2019-09-26 | Discharge: 2019-09-26 | Disposition: A | Discharge status: Home / Self Care | Payer: Medicare Other | Source: Ambulatory Visit | Attending: Cardiovascular Disease | Admitting: Cardiovascular Disease

## 2019-09-26 ENCOUNTER — Other Ambulatory Visit: Payer: Medicare Other

## 2019-09-26 ENCOUNTER — Telehealth: Payer: Self-pay | Admitting: *Deleted

## 2019-09-26 DIAGNOSIS — Z01812 Encounter for preprocedural laboratory examination: Secondary | ICD-10-CM | POA: Insufficient documentation

## 2019-09-26 DIAGNOSIS — R9430 Abnormal result of cardiovascular function study, unspecified: Secondary | ICD-10-CM

## 2019-09-26 DIAGNOSIS — Z20822 Contact with and (suspected) exposure to covid-19: Secondary | ICD-10-CM | POA: Insufficient documentation

## 2019-09-26 LAB — CBC WITH DIFFERENTIAL/PLATELET
Basophils Absolute: 0 10*3/uL (ref 0.0–0.2)
Basos: 1 %
EOS (ABSOLUTE): 0.3 10*3/uL (ref 0.0–0.4)
Eos: 5 %
Hematocrit: 43.6 % (ref 37.5–51.0)
Hemoglobin: 15.4 g/dL (ref 13.0–17.7)
Lymphocytes Absolute: 1.9 10*3/uL (ref 0.7–3.1)
Lymphs: 31 %
MCH: 32.2 pg (ref 26.6–33.0)
MCHC: 35.3 g/dL (ref 31.5–35.7)
MCV: 91 fL (ref 79–97)
Monocytes Absolute: 0.5 10*3/uL (ref 0.1–0.9)
Monocytes: 8 %
Neutrophils Absolute: 3.3 10*3/uL (ref 1.4–7.0)
Neutrophils: 55 %
Platelets: 139 10*3/uL — ABNORMAL LOW (ref 150–450)
RBC: 4.79 x10E6/uL (ref 4.14–5.80)
RDW: 14.1 % (ref 11.6–15.4)
WBC: 6 10*3/uL (ref 3.4–10.8)

## 2019-09-26 LAB — BASIC METABOLIC PANEL
BUN/Creatinine Ratio: 13 (ref 10–24)
BUN: 16 mg/dL (ref 8–27)
CO2: 24 mmol/L (ref 20–29)
Calcium: 9.4 mg/dL (ref 8.6–10.2)
Chloride: 105 mmol/L (ref 96–106)
Creatinine, Ser: 1.23 mg/dL (ref 0.76–1.27)
GFR calc Af Amer: 71 mL/min/{1.73_m2} (ref >59)
GFR calc non Af Amer: 61 mL/min/{1.73_m2} (ref >59)
Glucose: 147 mg/dL — ABNORMAL HIGH (ref 65–99)
Potassium: 4.3 mmol/L (ref 3.5–5.2)
Sodium: 139 mmol/L (ref 134–144)

## 2019-09-26 LAB — SARS CORONAVIRUS 2 (TAT 6-24 HRS): SARS Coronavirus 2: NEGATIVE

## 2019-09-26 NOTE — Telephone Encounter (Signed)
Pt contacted pre-catheterization scheduled at [facility] for: Thursday September 27, 2019 12 noon Verified arrival time and place: [facility]) at: 10 AM   No solid food after midnight prior to cath, clear liquids until 5 AM day of procedure.  Hold: Metformin-day of procedure and 48 hours post procedure.  Except hold medications AM meds can be  taken pre-cath with sip of water including: ASA 81 mg   Confirmed patient has responsible adult to drive home post procedure and observe 24 hours after arriving home: yes  You are allowed ONE visitor in the waiting room during your procedure. Both you and your visitor must wear masks.      COVID-19 Pre-Screening Questions:  . In the past 7 to 10 days have you had a cough,  shortness of breath, headache, congestion, fever (100 or greater) body aches, chills, sore throat, or sudden loss of taste or sense of smell? no . Have you been around anyone with known Covid 19 in the past 7 to 10 days? No  Reviewed procedure/mask/visitor instructions, COVID-19 screening questions with patient.

## 2019-09-27 ENCOUNTER — Other Ambulatory Visit: Payer: Self-pay

## 2019-09-27 ENCOUNTER — Encounter (HOSPITAL_COMMUNITY)
Admission: RE | Disposition: A | Discharge status: Home / Self Care | Payer: Self-pay | Source: Home / Self Care | Attending: Cardiovascular Disease

## 2019-09-27 ENCOUNTER — Ambulatory Visit (HOSPITAL_COMMUNITY)
Admission: RE | Admit: 2019-09-27 | Discharge: 2019-09-27 | Disposition: A | Discharge status: Home / Self Care | Payer: Medicare Other | Attending: Cardiovascular Disease | Admitting: Cardiovascular Disease

## 2019-09-27 DIAGNOSIS — Z951 Presence of aortocoronary bypass graft: Secondary | ICD-10-CM | POA: Diagnosis not present

## 2019-09-27 DIAGNOSIS — Z91040 Latex allergy status: Secondary | ICD-10-CM | POA: Diagnosis not present

## 2019-09-27 DIAGNOSIS — E7800 Pure hypercholesterolemia, unspecified: Secondary | ICD-10-CM | POA: Diagnosis not present

## 2019-09-27 DIAGNOSIS — I2510 Atherosclerotic heart disease of native coronary artery without angina pectoris: Secondary | ICD-10-CM | POA: Insufficient documentation

## 2019-09-27 DIAGNOSIS — Z881 Allergy status to other antibiotic agents status: Secondary | ICD-10-CM | POA: Diagnosis not present

## 2019-09-27 DIAGNOSIS — Z888 Allergy status to other drugs, medicaments and biological substances status: Secondary | ICD-10-CM | POA: Diagnosis not present

## 2019-09-27 DIAGNOSIS — I252 Old myocardial infarction: Secondary | ICD-10-CM | POA: Diagnosis not present

## 2019-09-27 DIAGNOSIS — K760 Fatty (change of) liver, not elsewhere classified: Secondary | ICD-10-CM | POA: Diagnosis not present

## 2019-09-27 DIAGNOSIS — I25810 Atherosclerosis of coronary artery bypass graft(s) without angina pectoris: Secondary | ICD-10-CM | POA: Diagnosis not present

## 2019-09-27 DIAGNOSIS — K219 Gastro-esophageal reflux disease without esophagitis: Secondary | ICD-10-CM | POA: Insufficient documentation

## 2019-09-27 DIAGNOSIS — E119 Type 2 diabetes mellitus without complications: Secondary | ICD-10-CM | POA: Diagnosis not present

## 2019-09-27 DIAGNOSIS — I472 Ventricular tachycardia: Secondary | ICD-10-CM | POA: Insufficient documentation

## 2019-09-27 DIAGNOSIS — I10 Essential (primary) hypertension: Secondary | ICD-10-CM | POA: Insufficient documentation

## 2019-09-27 DIAGNOSIS — M1990 Unspecified osteoarthritis, unspecified site: Secondary | ICD-10-CM | POA: Insufficient documentation

## 2019-09-27 DIAGNOSIS — Z883 Allergy status to other anti-infective agents status: Secondary | ICD-10-CM | POA: Insufficient documentation

## 2019-09-27 DIAGNOSIS — G2581 Restless legs syndrome: Secondary | ICD-10-CM | POA: Diagnosis not present

## 2019-09-27 HISTORY — PX: LEFT HEART CATH AND CORS/GRAFTS ANGIOGRAPHY: CATH118250

## 2019-09-27 LAB — GLUCOSE, CAPILLARY: Glucose-Capillary: 114 mg/dL — ABNORMAL HIGH (ref 70–99)

## 2019-09-27 SURGERY — LEFT HEART CATH AND CORS/GRAFTS ANGIOGRAPHY
Anesthesia: LOCAL

## 2019-09-27 MED ORDER — FENTANYL CITRATE (PF) 100 MCG/2ML IJ SOLN
INTRAMUSCULAR | Status: DC | PRN
  Administered 2019-09-27: 50 ug via INTRAVENOUS

## 2019-09-27 MED ORDER — SODIUM CHLORIDE 0.9 % IV SOLN: 250.0000 mL | INTRAVENOUS | Status: DC | PRN

## 2019-09-27 MED ORDER — SODIUM CHLORIDE 0.9% FLUSH: 3.0000 mL | Freq: Two times a day (BID) | INTRAVENOUS | Status: DC

## 2019-09-27 MED ORDER — IOHEXOL 350 MG/ML SOLN
INTRAVENOUS | Status: DC | PRN
  Administered 2019-09-27: 60 mL via INTRA_ARTERIAL

## 2019-09-27 MED ORDER — LIDOCAINE HCL (PF) 1 % IJ SOLN
INTRAMUSCULAR | Status: AC
  Filled 2019-09-27: qty 30

## 2019-09-27 MED ORDER — LIDOCAINE HCL (PF) 1 % IJ SOLN
INTRAMUSCULAR | Status: DC | PRN
  Administered 2019-09-27: 2 mL via INTRADERMAL

## 2019-09-27 MED ORDER — VERAPAMIL HCL 2.5 MG/ML IV SOLN
INTRAVENOUS | Status: DC | PRN
  Administered 2019-09-27: 10 mL via INTRA_ARTERIAL

## 2019-09-27 MED ORDER — SODIUM CHLORIDE 0.9 % WEIGHT BASED INFUSION
3.0000 mL/kg/h | INTRAVENOUS | Status: AC
  Administered 2019-09-27: 3 mL/kg/h via INTRAVENOUS

## 2019-09-27 MED ORDER — VERAPAMIL HCL 2.5 MG/ML IV SOLN
INTRAVENOUS | Status: AC
  Filled 2019-09-27: qty 2

## 2019-09-27 MED ORDER — ASPIRIN 81 MG PO CHEW: 81.0000 mg | CHEWABLE_TABLET | ORAL | Status: DC

## 2019-09-27 MED ORDER — MIDAZOLAM HCL 2 MG/2ML IJ SOLN
INTRAMUSCULAR | Status: AC
  Filled 2019-09-27: qty 2

## 2019-09-27 MED ORDER — SODIUM CHLORIDE 0.9% FLUSH: 3.0000 mL | INTRAVENOUS | Status: DC | PRN

## 2019-09-27 MED ORDER — SODIUM CHLORIDE 0.9 % WEIGHT BASED INFUSION: 1.0000 mL/kg/h | INTRAVENOUS | Status: DC

## 2019-09-27 MED ORDER — HEPARIN (PORCINE) IN NACL 1000-0.9 UT/500ML-% IV SOLN
INTRAVENOUS | Status: AC
  Filled 2019-09-27: qty 1000

## 2019-09-27 MED ORDER — HEPARIN SODIUM (PORCINE) 1000 UNIT/ML IJ SOLN
INTRAMUSCULAR | Status: DC | PRN
  Administered 2019-09-27: 3500 [IU] via INTRAVENOUS

## 2019-09-27 MED ORDER — HEPARIN SODIUM (PORCINE) 1000 UNIT/ML IJ SOLN
INTRAMUSCULAR | Status: AC
  Filled 2019-09-27: qty 1

## 2019-09-27 MED ORDER — HEPARIN (PORCINE) IN NACL 1000-0.9 UT/500ML-% IV SOLN
INTRAVENOUS | Status: DC | PRN
  Administered 2019-09-27 (×2): 500 mL

## 2019-09-27 MED ORDER — FENTANYL CITRATE (PF) 100 MCG/2ML IJ SOLN
INTRAMUSCULAR | Status: AC
  Filled 2019-09-27: qty 2

## 2019-09-27 MED ORDER — MIDAZOLAM HCL 2 MG/2ML IJ SOLN
INTRAMUSCULAR | Status: DC | PRN
  Administered 2019-09-27: 2 mg via INTRAVENOUS

## 2019-09-27 SURGICAL SUPPLY — 10 items
CATH INFINITI 5FR MULTPACK ANG (CATHETERS) ×2 IMPLANT
DEVICE RAD COMP TR BAND LRG (VASCULAR PRODUCTS) ×2 IMPLANT
GLIDESHEATH SLEND SS 6F .021 (SHEATH) ×2 IMPLANT
GUIDEWIRE INQWIRE 1.5J.035X260 (WIRE) ×1 IMPLANT
INQWIRE 1.5J .035X260CM (WIRE) ×2
KIT HEART LEFT (KITS) ×2 IMPLANT
PACK CARDIAC CATHETERIZATION (CUSTOM PROCEDURE TRAY) ×2 IMPLANT
SHEATH PROBE COVER 6X72 (BAG) ×2 IMPLANT
TRANSDUCER W/STOPCOCK (MISCELLANEOUS) ×2 IMPLANT
TUBING CIL FLEX 10 FLL-RA (TUBING) ×2 IMPLANT

## 2019-09-27 NOTE — Discharge Instructions (Signed)
Radial Site Care  This sheet gives you information about how to care for yourself after your procedure. Your health care provider may also give you more specific instructions. If you have problems or questions, contact your health care provider. What can I expect after the procedure? After the procedure, it is common to have:  Bruising and tenderness at the catheter insertion area. Follow these instructions at home: Medicines  Take over-the-counter and prescription medicines only as told by your health care provider. Insertion site care  Follow instructions from your health care provider about how to take care of your insertion site. Make sure you: ? Wash your hands with soap and water before you change your bandage (dressing). If soap and water are not available, use hand sanitizer. ? Change your dressing as told by your health care provider. ? Leave stitches (sutures), skin glue, or adhesive strips in place. These skin closures may need to stay in place for 2 weeks or longer. If adhesive strip edges start to loosen and curl up, you may trim the loose edges. Do not remove adhesive strips completely unless your health care provider tells you to do that.  Check your insertion site every day for signs of infection. Check for: ? Redness, swelling, or pain. ? Fluid or blood. ? Pus or a bad smell. ? Warmth.  Do not take baths, swim, or use a hot tub until your health care provider approves.  You may shower 24-48 hours after the procedure, or as directed by your health care provider. ? Remove the dressing and gently wash the site with plain soap and water. ? Pat the area dry with a clean towel. ? Do not rub the site. That could cause bleeding.  Do not apply powder or lotion to the site. Activity   For 24 hours after the procedure, or as directed by your health care provider: ? Do not flex or bend the affected arm. ? Do not push or pull heavy objects with the affected arm. ? Do not  drive yourself home from the hospital or clinic. You may drive 24 hours after the procedure unless your health care provider tells you not to. ? Do not operate machinery or power tools.  Do not lift anything that is heavier than 10 lb (4.5 kg), or the limit that you are told, until your health care provider says that it is safe.  Ask your health care provider when it is okay to: ? Return to work or school. ? Resume usual physical activities or sports. ? Resume sexual activity. General instructions  If the catheter site starts to bleed, raise your arm and put firm pressure on the site. If the bleeding does not stop, get help right away. This is a medical emergency.  If you went home on the same day as your procedure, a responsible adult should be with you for the first 24 hours after you arrive home.  Keep all follow-up visits as told by your health care provider. This is important. Contact a health care provider if:  You have a fever.  You have redness, swelling, or yellow drainage around your insertion site. Get help right away if:  You have unusual pain at the radial site.  The catheter insertion area swells very fast.  The insertion area is bleeding, and the bleeding does not stop when you hold steady pressure on the area.  Your arm or hand becomes pale, cool, tingly, or numb. These symptoms may represent a serious problem   that is an emergency. Do not wait to see if the symptoms will go away. Get medical help right away. Call your local emergency services (911 in the [place] Do not drive yourself to the hospital. Summary  After the procedure, it is common to have bruising and tenderness at the site.  Follow instructions from your health care provider about how to take care of your radial site wound. Check the wound every day for signs of infection.  Do not lift anything that is heavier than 10 lb (4.5 kg), or the limit that you are told, until your health care provider says  that it is safe. This information is not intended to replace advice given to you by your health care provider. Make sure you discuss any questions you have with your health care provider. Document Revised: 05/11/2017 Document Reviewed: 05/11/2017 Elsevier Patient Education  2020 Elsevier Inc.  

## 2019-09-27 NOTE — Interval H&P Note (Signed)
History and Physical Interval Note:  09/27/2019 12:08 PM  Joseph [name]  has presented today for surgery, with the diagnosis of cad - angina.  The various methods of treatment have been discussed with the patient and family. After consideration of risks, benefits and other options for treatment, the patient has consented to  Procedure(s): LEFT HEART CATH AND CORS/GRAFTS ANGIOGRAPHY (N/A) as a surgical intervention.  The patient's history has been reviewed, patient examined, no change in status, stable for surgery.  I have reviewed the patient's chart and labs.  Questions were answered to the patient's satisfaction.    Cath Lab Visit (complete for each Cath Lab visit)  Clinical Evaluation Leading to the Procedure:   ACS: No.  Non-ACS:    Anginal Classification: No Symptoms  Anti-ischemic medical therapy: Minimal Therapy (1 class of medications)  Non-Invasive Test Results: High-risk stress test findings: cardiac mortality >3%/year  Prior CABG: Previous CABG        [name]

## 2019-09-28 ENCOUNTER — Encounter (HOSPITAL_COMMUNITY): Payer: Self-pay | Admitting: Cardiovascular Disease

## 2019-09-28 ENCOUNTER — Telehealth: Payer: Self-pay | Admitting: Cardiovascular Disease

## 2019-09-28 ENCOUNTER — Encounter (HOSPITAL_COMMUNITY): Payer: Medicare Other

## 2019-09-28 NOTE — Telephone Encounter (Signed)
Answered several questions from patient and his significant other, Butch Penny re: post cath care. - can he use ibuprofen - can he drink wine - cath site care - lifting restrictions - sex    Scheduled post cath follow up with APP.

## 2019-09-28 NOTE — Telephone Encounter (Signed)
New Message   Pt is calling and is asking for Joseph [name] to call him  He says he has questions regarding his discharge yesterday    Please call

## 2019-10-11 ENCOUNTER — Ambulatory Visit: Payer: Medicare Other | Admitting: Family Medicine

## 2019-10-19 ENCOUNTER — Other Ambulatory Visit: Payer: Self-pay | Admitting: Internal Medicine

## 2019-10-19 NOTE — Telephone Encounter (Signed)
Last filled 08-20-19 #30 Last OV 08-29-19 Next OV 03-19-20 [facility] [address]

## 2019-10-29 NOTE — Progress Notes (Signed)
Cardiology Office Note    Date:  10/31/2019   ID:  Joseph [name], DOB 1955/11/19, MRN [id number]  PCP:  Venia Carbon, MD  Cardiologist: [name], MD EPS: None  No chief complaint on file.   History of Present Illness:  Joseph [name] is a 64 y.o. male with history of CAD status post anterior STEMI in 2010 with patent stent in the ostial LAD with 99% stenosis in the mid LAD, failed PCI attempt followed by ventricular fibrillation taken to emergency CABG x4 chest wall pain underwent sternotomy and removal of sternal wires by Dr. Lunette Stands at [facility] 03/2009.  Chest pain in 2000 11 repeat cath showed patent LIMA to the LAD, patent SVG to the OM, occluded SVG to diagonal and occluded SVG to RCA, EF 45%.  Patient saw Dr. Angelena Form 07/2019 with and was having rest chest pain but no exertional symptoms.  NST he had exercise-induced polymorphic ventricular tachycardia and Dr. Caryl Comes recommended he undergo cardiac catheterization and to resume his beta-blocker.  Cardiac cath showed 2/4 patent bypass grafts, chronic occlusion of the mid LAD moderate proximal RCA stenosis unchanged from last cath vein graft to the RCA is known to be occluded.  Recommended continue medical management of CAD.  2D echo showed normal LVEF 55 to 60% with grade 1 DD.  Patient comes in for f/u. No chest pain, shortness of breath, dizziness or presyncope. Walks 30 min several days/week. Needs colonoscopy by Dr. Henrene Pastor and they want to make sure he can have propofal for procedure.    Past Medical History:  Diagnosis Date  . Allergy   . Arthritis    knees and hip  . CAD (coronary artery disease)    s/p emergent 4 V CABG (4/10) w 2/4 patent grafts by cath 08/11/09, patent LIMA to LAD & SVG to OM w moderate disease native RCA & occluded grafts to RCA and diagonal  . Chest pain    secondary to sternal non-union post CABg-now s/p sternotomy w repair 03/28/09 at [facility], dr. Lunette Stands  .  Diverticulitis    with perforated colon  . Diverticulitis    2014  . Fatty liver    non-alcoholic  . GERD (gastroesophageal reflux disease)    with Schatzki ring  . History of chicken pox   . HTN (hypertension)    controlled  . Hypercholesterolemia   . 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 Myocardial infarction ([place]) [id number]  . Nephrolithiasis   . Normal echocardiogram 5/10   EF 60, apical & apicallateral HK, PASP 68mmHg. Repeat at [facility] w reported ef of 40%.   . Pneumonia 2016   in past   . Restless leg syndrome   . Schatzki's ring   . Type 2 diabetes mellitus, controlled ([place]) 6/15   type 2    Past Surgical History:  Procedure Laterality Date  . CATARACT EXTRACTION W/PHACO Right 08/18/2016   Procedure: CATARACT EXTRACTION PHACO AND INTRAOCULAR LENS PLACEMENT ([place]) Right diabetic;  Surgeon: Leandrew Koyanagi, MD;  Location: [facility];  Service: Ophthalmology;  Laterality: Right;  Diabetic-oral meds poss Latex allergy  . CATARACT EXTRACTION W/PHACO Left 09/15/2016   Procedure: CATARACT EXTRACTION PHACO AND INTRAOCULAR LENS PLACEMENT ([place])  Left diabetic;  Surgeon: Leandrew Koyanagi, MD;  Location: [place];  Service: Ophthalmology;  Laterality: Left;  Diabetic - oral meds  . COLON SURGERY  2014  . COLONOSCOPY  [id number]   2010 colon normal- 2003 HPP x  1   . CORONARY ANGIOPLASTY  2003   stentx2  . CORONARY ARTERY BYPASS GRAFT  08/16/2008   x4  . ESOPHAGEAL DILATION  ~2009   Dr Earlean Shawl  . ILEOSTOMY  2014   with reversal-- after diverticulitis. Sigmoid colectomy also. Dr Ely/Bird  . INGUINAL HERNIA REPAIR Left x2   Dr Ernst Spell  . LEFT HEART CATH AND CORS/GRAFTS ANGIOGRAPHY N/A 09/27/2019   Procedure: LEFT HEART CATH AND CORS/GRAFTS ANGIOGRAPHY;  Surgeon: Burnell Blanks, MD;  Location: [place] [facility];  Service: Cardiovascular;  Laterality: N/A;  . pilonidal sinus tract excision    . POLYPECTOMY  2007    HPP x 1   . STERNUM SEPARATION REPAIR  03/2009   [place]  . UPPER GASTROINTESTINAL ENDOSCOPY    . VASECTOMY      Current Medications: Current Meds  Medication Sig  . ALPRAZolam (XANAX) 0.5 MG tablet Take 0.5 tablets (0.25 mg total) by mouth at bedtime.  Marland Kitchen aspirin (ASPIR-81) 81 MG EC tablet Take 81 mg by mouth daily.   Marland Kitchen atorvastatin (LIPITOR) 20 MG tablet Take 1 tablet (20 mg total) by mouth daily.  . cyclobenzaprine (FLEXERIL) 10 MG tablet Take 0.5-1 tablets (5-10 mg total) by mouth 3 (three) times daily as needed for muscle spasms.  Marland Kitchen glucose blood (ONETOUCH VERIO) test strip Use to check blood sugar once a day. Dx Code E11.9  . ibuprofen (ADVIL,MOTRIN) 200 MG tablet Take 400 mg by mouth 2 (two) times daily as needed (pain).   Elmore Guise Devices (ONE TOUCH DELICA LANCING DEV) MISC Use to obtain blood sample for glucose Dx Code E11.9  . losartan (COZAAR) 50 MG tablet TAKE 1 TABLET BY MOUTH EVERY DAY (Patient taking differently: Take 50 mg by mouth daily. )  . metFORMIN (GLUCOPHAGE-XR) 750 MG 24 hr tablet TAKE 1 TABLET (750 MG TOTAL) BY MOUTH DAILY WITH BREAKFAST. (Patient taking differently: Take 750 mg by mouth daily with breakfast. )  . metoprolol succinate (TOPROL-XL) 50 MG 24 hr tablet TAKE 1 TABLET (50 MG TOTAL) BY MOUTH DAILY. TAKE WITH OR IMMEDIATELY FOLLOWING A MEAL.  . nitroGLYCERIN (NITROSTAT) 0.4 MG SL tablet Place 1 tablet (0.4 mg total) under the tongue every 5 (five) minutes as needed for chest pain.  . Omega-3 Fatty Acids (FISH OIL) 1000 MG CAPS Take 1,000 mg by mouth daily.   Marland Kitchen omeprazole (PRILOSEC) 40 MG capsule TAKE 1 CAPSULE (40 MG TOTAL) BY MOUTH DAILY.  Glory Rosebush Delica Lancets 19J MISC 1 each by Does not apply route daily. Use to obtain sample for glucose level Dx Code E11.9  . traZODone (DESYREL) 100 MG tablet TAKE 1.5 TABLETS (150 MG TOTAL) BY MOUTH AT BEDTIME AS NEEDED FOR SLEEP. (Patient taking differently: Take 100 mg by mouth at bedtime.  Takes 1 tab nightly)     Allergies:   Zolpidem, Cephalexin, Keflin [cephalothin], Latex, Lisinopril, Zolpidem tartrate, and Lamisil [terbinafine]   Social History   Socioeconomic History  . Marital status: Married    Spouse name: Not on file  . Number of children: 3  . Years of education: Not on file  . Highest education level: Not on file  Occupational History  . Occupation: Animator    Comment: Disabled  . Occupation: Florist delivery    Comment: part time  Tobacco Use  . Smoking status: Former Smoker    Packs/day: 0.50    Years: 37.00    Pack years: 18.50    Types:  Cigarettes    Quit date: 07/18/2008    Years since quitting: 11.2  . Smokeless tobacco: Never Used  . Tobacco comment: No smoking since bypass in 4/10  Substance and Sexual Activity  . Alcohol use: Yes    Alcohol/week: 0.0 standard drinks    Comment: rarely  . Drug use: No  . Sexual activity: Not on file  Other Topics Concern  . Not on file  Social History Narrative   Has living will   Wife is health care POA---alternate is daughter Ria Comment   Would accept resuscitation   Not sure about tube feeds--probably not prolonged   Social Determinants of Health   Financial Resource Strain:   . Difficulty of Paying Living Expenses:   Food Insecurity:   . Worried About Charity fundraiser in the Last Year:   . Arboriculturist in the Last Year:   Transportation Needs:   . Film/video editor (Medical):   Marland Kitchen Lack of Transportation (Non-Medical):   Physical Activity:   . Days of Exercise per Week:   . Minutes of Exercise per Session:   Stress:   . Feeling of Stress :   Social Connections:   . Frequency of Communication with Friends and Family:   . Frequency of Social Gatherings with Friends and Family:   . Attends Religious Services:   . Active Member of Clubs or Organizations:   . Attends Archivist Meetings:   Marland Kitchen Marital Status:      Family History:  The patient's family  history includes Bone cancer in his maternal grandmother; Breast cancer in his maternal aunt; Cancer in his maternal aunt, maternal grandmother, and mother; Congestive Heart Failure in his mother; Diabetes in his brother, brother, mother, and sister; Heart attack in his brother, brother, father, and sister; Heart disease in his mother; Heart failure in his mother.   ROS:   Please see the history of present illness.    ROS All other systems reviewed and are negative.   PHYSICAL EXAM:   VS:  BP 122/82   Pulse 61   Ht 5\' 9"  (1.753 m)   Wt 163 lb (73.9 kg)   SpO2 98%   BMI 24.07 kg/m   Physical Exam  GEN: Well nourished, well developed, in no acute distress  Neck: no JVD, carotid bruits, or masses Cardiac:RRR; S4, 1/6 systolic murmur LSB Respiratory:  clear to auscultation bilaterally, normal work of breathing GI: soft, nontender, nondistended, + BS Ext: left arm at cath site without hematoma or hemorrhage, lower ext without cyanosis, clubbing, or edema,  Neuro:  Alert and Oriented x 3 Psych: euthymic mood, full affect  Wt Readings from Last 3 Encounters:  10/31/19 163 lb (73.9 kg)  09/27/19 161 lb (73 kg)  09/14/19 163 lb (73.9 kg)      Studies/Labs Reviewed:   EKG:  EKG is not ordered today.    Recent Labs: 02/23/2019: ALT 57 09/26/2019: BUN 16; Creatinine, Ser 1.23; Hemoglobin 15.4; Platelets 139; Potassium 4.3; Sodium 139   Lipid Panel    Component Value Date/Time   CHOL 122 02/23/2019 1228   CHOL 92 09/14/2012 1521   TRIG 230.0 (H) 02/23/2019 1228   TRIG 230 (H) 09/14/2012 1521   HDL 28.40 (L) 02/23/2019 1228   HDL 22 (L) 09/14/2012 1521   CHOLHDL 4 02/23/2019 1228   VLDL 46.0 (H) 02/23/2019 1228   VLDL 46 (H) 09/14/2012 1521   LDLCALC 58 09/16/2017 0932   LDLCALC 24 09/14/2012  1521   LDLDIRECT 67.0 02/23/2019 1228    Additional studies/ records that were reviewed today include:  Cardiac cath 09/27/2019  Prox RCA lesion is 50% stenosed.  Mid RCA lesion is 30%  stenosed.  Dist RCA lesion is 30% stenosed.  Mid LAD lesion is 100% stenosed.  LIMA graft was visualized by angiography and is normal in caliber.  Ost Cx to Prox Cx lesion is 100% stenosed.  SVG graft was visualized by angiography and is normal in caliber.  Origin to Prox Graft lesion is 100% stenosed.  SVG graft was not visualized.  Origin to Prox Graft lesion is 100% stenosed.  SVG graft was not visualized.   1. Triple vessel CAD s/p 4V CABG with 2/4 patent bypass grafts 2. Chronic occlusion of the mid LAD. The mid and distal LAD fills from the patent LIMA graft. The vein graft to the diagonal branch is known to be occluded and is not injected.  3. Chronic occlusion of the proximal Circumflex. The vein graft to the OM is patent.  4. Moderate proximal RCA stenosis, unchanged from the last cath in 2010. The vein graft to the RCA is known to be occluded.  5. Normal filling pressures   Recommendations: Continue medical management of CAD    NST 5/28/2021Study Highlights   Nuclear stress EF: 56%. The left ventricular ejection fraction is normal (55-65%).  There was no ST segment deviation noted during stress.  Defect 1: There is a medium defect of moderate severity present in the apical anterior, apical inferior and apex location.  Findings consistent with prior anterior apical myocardial infarction with peri-infarct ischemia.  The left ventricular ejection fraction is normal (55-65%).  This is an intermediate risk study.  The patient reportedly had polymorphic nonsustained VT at the end of the treadmill but it was not recorded on the ecg strips   2D echo 09/04/2019 IMPRESSIONS     1. Left ventricular ejection fraction, by estimation, is 55 to 60%. The  left ventricle has normal function. The left ventricle has no regional  wall motion abnormalities. There is mild concentric left ventricular  hypertrophy. Left ventricular diastolic  parameters are consistent with  Grade I diastolic dysfunction (impaired  relaxation).   2. Right ventricular systolic function is normal. The right ventricular  size is normal. There is normal pulmonary artery systolic pressure.   3. The mitral valve is normal in structure. No evidence of mitral valve  regurgitation. No evidence of mitral stenosis.   4. The aortic valve is tricuspid. Aortic valve regurgitation is not  visualized. No aortic stenosis is present.   5. The inferior vena cava is normal in size with greater than 50%  respiratory variability, suggesting right atrial pressure of 3 mmHg.   FINDINGS   Left Ventricle: Left ventricular ejection fraction, by estimation, is 55  to 60%. The left ventricle has normal function. The left ventricle has no  regional wall motion abnormalities. The left ventricular internal cavity  size was normal in size. There is   mild concentric left ventricular hypertrophy. Left ventricular diastolic  parameters are consistent with Grade I diastolic dysfunction (impaired  relaxation).   Right Ventricle: The right ventricular size is normal. No increase in  right ventricular wall thickness. Right ventricular systolic function is  normal. There is normal pulmonary artery systolic pressure. The tricuspid  regurgitant velocity is 2.28 m/s, and   with an assumed right atrial pressure of 3 mmHg, the estimated right  ventricular systolic pressure is 23.8  mmHg.   Left Atrium: Left atrial size was normal in size.   Right Atrium: Right atrial size was normal in size.   Pericardium: There is no evidence of pericardial effusion.   Mitral Valve: The mitral valve is normal in structure. Normal mobility of  the mitral valve leaflets. No evidence of mitral valve regurgitation. No  evidence of mitral valve stenosis.   Tricuspid Valve: The tricuspid valve is normal in structure. Tricuspid  valve regurgitation is trivial. No evidence of tricuspid stenosis.   Aortic Valve: The aortic valve is  tricuspid. Aortic valve regurgitation is  not visualized. No aortic stenosis is present.   Pulmonic Valve: The pulmonic valve was normal in structure. Pulmonic valve  regurgitation is trivial. No evidence of pulmonic stenosis.   Aorta: The aortic root is normal in size and structure.   Venous: The inferior vena cava is normal in size with greater than 50%  respiratory variability, suggesting right atrial pressure of 3 mmHg.   IAS/Shunts: No atrial level shunt detected by color flow Doppler.        ASSESSMENT:    1. Coronary artery disease involving coronary bypass graft of native heart without angina pectoris   2. VT (ventricular tachycardia) ([place])   3. Preoperative clearance   4. Essential hypertension   5. Hyperlipidemia, unspecified hyperlipidemia type      PLAN:  In order of problems listed above:  CAD status post emergency CABG times 07-2008, recent rest chest pain and exercise-induced polymorphic V. tach led to repeat cardiac catheterization 09/2019 medical management recommended. No further chest pain, palpitations, dizziness or presyncope  Preoperative cardiac clearance for colonoscopy by Dr. Henrene Pastor to make sure he can have propofal. I discussed with Dr. Lovena Le who says patient can proceed with colonoscopy with propofal as long as the patient is monitored on telemetry. According to the Revised Cardiac Risk Index (RCRI), his Perioperative Risk of Major Cardiac Event is (%): 0.9  His Functional Capacity in METs is: 7.99 according to the [facility] Activity Status Index (DASI).    Exercise-induced polymorphic ventricular tachycardia on beta-blocker which was held for the stress test  Hypertension controlled  Hyperlipidemia on lipitor LDL 67 02/2019    Medication Adjustments/Labs and Tests Ordered: Current medicines are reviewed at length with the patient today.  Concerns regarding medicines are outlined above.  Medication changes, Labs and Tests ordered today are listed in  the Patient Instructions below. Patient Instructions  Medication Instructions:  Your physician recommends that you continue on your current medications as directed. Please refer to the Current Medication list given to you today.  *If you need a refill on your cardiac medications before your next appointment, please call your pharmacy*   Lab Work: None If you have labs (blood work) drawn today and your tests are completely normal, you will receive your results only by: Marland Kitchen MyChart Message (if you have MyChart) OR . A paper copy in the mail If you have any lab test that is abnormal or we need to change your treatment, we will call you to review the results.   Testing/Procedures: None   Follow-Up: At [facility], you and your health needs are our priority.  As part of our continuing mission to provide you with exceptional heart care, we have created designated Provider Care Teams.  These Care Teams include your primary Cardiologist (physician) and Advanced Practice Providers (APPs -  Physician Assistants and Nurse Practitioners) who all work together to provide you with the care you need,  when you need it.  We recommend signing up for the patient portal called "MyChart".  Sign up information is provided on this After Visit Summary.  MyChart is used to connect with patients for Virtual Visits (Telemedicine).  Patients are able to view lab/test results, encounter notes, upcoming appointments, etc.  Non-urgent messages can be sent to your provider as well.   To learn more about what you can do with MyChart, go to [email or web address].    Your next appointment:   6 month(s)  The format for your next appointment:   In Person  Provider:   You may see [name], MD or one of the following Advanced Practice Providers on your designated Care Team:    Melina Copa, PA-C  Ermalinda Barrios, PA-C    Other Instructions None     Signed, Ermalinda Barrios, PA-C  10/31/2019 9:52 AM     [facility] [address], [place], [place]  [address] Phone: [phone]; Fax: [phone]

## 2019-10-31 ENCOUNTER — Other Ambulatory Visit: Payer: Self-pay

## 2019-10-31 ENCOUNTER — Encounter: Payer: Self-pay | Admitting: Physician Assistant

## 2019-10-31 ENCOUNTER — Ambulatory Visit (INDEPENDENT_AMBULATORY_CARE_PROVIDER_SITE_OTHER): Payer: Medicare Other | Admitting: Physician Assistant

## 2019-10-31 VITALS — BP 122/82 | HR 61 | Ht 69.0 in | Wt 163.0 lb

## 2019-10-31 DIAGNOSIS — I472 Ventricular tachycardia, unspecified: Secondary | ICD-10-CM

## 2019-10-31 DIAGNOSIS — I10 Essential (primary) hypertension: Secondary | ICD-10-CM

## 2019-10-31 DIAGNOSIS — I25810 Atherosclerosis of coronary artery bypass graft(s) without angina pectoris: Secondary | ICD-10-CM

## 2019-10-31 DIAGNOSIS — E785 Hyperlipidemia, unspecified: Secondary | ICD-10-CM

## 2019-10-31 DIAGNOSIS — Z01818 Encounter for other preprocedural examination: Secondary | ICD-10-CM

## 2019-10-31 NOTE — Patient Instructions (Signed)
Medication Instructions:  Your physician recommends that you continue on your current medications as directed. Please refer to the Current Medication list given to you today.  *If you need a refill on your cardiac medications before your next appointment, please call your pharmacy*   Lab Work: None If you have labs (blood work) drawn today and your tests are completely normal, you will receive your results only by: . MyChart Message (if you have MyChart) OR . A paper copy in the mail If you have any lab test that is abnormal or we need to change your treatment, we will call you to review the results.   Testing/Procedures: None   Follow-Up: At [facility], you and your health needs are our priority.  As part of our continuing mission to provide you with exceptional heart care, we have created designated Provider Care Teams.  These Care Teams include your primary Cardiologist (physician) and Advanced Practice Providers (APPs -  Physician Assistants and Nurse Practitioners) who all work together to provide you with the care you need, when you need it.  We recommend signing up for the patient portal called "MyChart".  Sign up information is provided on this After Visit Summary.  MyChart is used to connect with patients for Virtual Visits (Telemedicine).  Patients are able to view lab/test results, encounter notes, upcoming appointments, etc.  Non-urgent messages can be sent to your provider as well.   To learn more about what you can do with MyChart, go to [email or web address].    Your next appointment:   6 month(s)  The format for your next appointment:   In Person  Provider:   You may see Christopher McAlhany, MD or one of the following Advanced Practice Providers on your designated Care Team:    Dayna Dunn, PA-C  Michele Lenze, PA-C    Other Instructions None  

## 2019-11-25 ENCOUNTER — Other Ambulatory Visit: Payer: Self-pay | Admitting: Internal Medicine

## 2019-12-11 ENCOUNTER — Telehealth: Payer: Self-pay | Admitting: Internal Medicine

## 2019-12-11 NOTE — Telephone Encounter (Signed)
Called pt- verified we have cardiac clearance from Cardiologist- Verified with pt and wife this is a virtual PV- will do Sutab as prep- Pt has had both Cov vax- and verified Time for Thursday PV - encouraged to call with questions

## 2019-12-13 ENCOUNTER — Ambulatory Visit (AMBULATORY_SURGERY_CENTER): Payer: Medicare Other | Admitting: *Deleted

## 2019-12-13 ENCOUNTER — Other Ambulatory Visit: Payer: Self-pay

## 2019-12-13 VITALS — Ht 69.0 in | Wt 161.0 lb

## 2019-12-13 DIAGNOSIS — Z1211 Encounter for screening for malignant neoplasm of colon: Secondary | ICD-10-CM

## 2019-12-13 MED ORDER — SUTAB 1479-225-188 MG PO TABS: 1.0000 | ORAL_TABLET | Freq: Once | ORAL | 0 refills | Status: AC

## 2019-12-13 NOTE — Progress Notes (Signed)
covid vaccines completed 06-29-19  Pt is aware that care partner will wait in the car during procedure; if they feel like they will be too hot or cold to wait in the car; they may wait in the 4 th floor lobby. Patient is aware to bring only one care partner. We want them to wear a mask (we do not have any that we can provide them), practice social distancing, and we will check their temperatures when they get here.  I did remind the patient that their care partner needs to stay in the parking lot the entire time and have a cell phone available, we will call them when the pt is ready for discharge. Patient will wear mask into building.  Pt's previsit is done over the phone and all paperwork (prep instructions, blank consent form to just read over, pre-procedure acknowledgement form and stamped envelope) sent to patient  Pt's wife on the phone for his PV  No trouble with anesthesia, difficulty with intubation or hx/fam hx of malignant hyperthermia per pt   No egg or soy allergy  No home oxygen use   No medications for weight loss taken  Pt denies constipation issues   Sutab code put into RX and paper copy given to pt to show pharmacy

## 2019-12-16 ENCOUNTER — Other Ambulatory Visit: Payer: Self-pay | Admitting: Internal Medicine

## 2019-12-17 NOTE — Telephone Encounter (Signed)
Last office visit 08/29/2019 for 6 month follow up.  Last refilled 10/19/2019 for #30 with no refills.  CPE scheduled for 03/19/2020.

## 2019-12-21 NOTE — Telephone Encounter (Signed)
Patient called along with his wife in reference to everyone being in network with insurance prior to his procedure. Please call

## 2019-12-21 NOTE — Telephone Encounter (Signed)
Will Call Amy Tuesday 9-7  morning with pt's concerns and questions

## 2019-12-26 ENCOUNTER — Telehealth: Payer: Self-pay | Admitting: Gastroenterology

## 2019-12-26 NOTE — Telephone Encounter (Signed)
Received page to on call. Scheduled for Colonoscopy tomorrow w/ Dr. Henrene Pastor. Took first dose of Sutab bowel prep. Had nausea vomiting hour+ later and decided to check BP which was 150/100 which was elevated from baseline 130'2/70-80's. HR 50. BG 101. Continued with fluids, no further n/v. Repeat BP hour or so later was 141/106 so he paged the on call.   Otherwise feels fine. No CP, SOB, dizziness, syncope, headaches, blurry vision, etc. Hx of CAD with CABG in 2010. Just competed extensive cardiac w/u for pre-operative assessment prior to tomorrow's colonoscopy, including heart cath (no PCI needed).   Took all BP meds as prescribed this AM.   Discussed ddx for elevated DBP. Does not meet criteria for hypertensive urgency/emergency. Advised to continue fluids as currently instructed for bowel prep. Will recheck BP prior to schedule dose #2 at 0200. If still elevated DBP, patient and wife would like to stop bowel prep and cancel tomorrow's procedure in favor of cardiac eval d/t his cardiac hx. If DMP back to near baseline, will continue prep as scheduled for AM case. All questions answered and appreciative of call back.

## 2019-12-27 ENCOUNTER — Telehealth: Payer: Self-pay | Admitting: Nurse Practitioner

## 2019-12-27 ENCOUNTER — Encounter: Payer: Self-pay | Admitting: Internal Medicine

## 2019-12-27 ENCOUNTER — Ambulatory Visit (AMBULATORY_SURGERY_CENTER): Payer: Medicare Other | Admitting: Internal Medicine

## 2019-12-27 ENCOUNTER — Other Ambulatory Visit: Payer: Self-pay

## 2019-12-27 VITALS — BP 134/75 | HR 54 | Temp 97.8°F | Resp 9 | Ht 69.0 in | Wt 161.0 lb

## 2019-12-27 DIAGNOSIS — D122 Benign neoplasm of ascending colon: Secondary | ICD-10-CM | POA: Diagnosis not present

## 2019-12-27 DIAGNOSIS — I10 Essential (primary) hypertension: Secondary | ICD-10-CM | POA: Diagnosis not present

## 2019-12-27 DIAGNOSIS — Z86010 Personal history of colonic polyps: Secondary | ICD-10-CM

## 2019-12-27 DIAGNOSIS — I2510 Atherosclerotic heart disease of native coronary artery without angina pectoris: Secondary | ICD-10-CM | POA: Diagnosis not present

## 2019-12-27 DIAGNOSIS — Z1211 Encounter for screening for malignant neoplasm of colon: Secondary | ICD-10-CM

## 2019-12-27 DIAGNOSIS — E119 Type 2 diabetes mellitus without complications: Secondary | ICD-10-CM | POA: Diagnosis not present

## 2019-12-27 DIAGNOSIS — G2581 Restless legs syndrome: Secondary | ICD-10-CM | POA: Diagnosis not present

## 2019-12-27 DIAGNOSIS — D124 Benign neoplasm of descending colon: Secondary | ICD-10-CM | POA: Diagnosis not present

## 2019-12-27 MED ORDER — SODIUM CHLORIDE 0.9 % IV SOLN
500.0000 mL | INTRAVENOUS | Status: DC
Start: 2019-12-27 — End: 2019-12-27

## 2019-12-27 NOTE — Telephone Encounter (Signed)
Patient called last night. Had vomiting and elevated BP after taking 1st dose of bowel prep. By the time he called his BP had improved, vomiting had stopped and he was beginning to feel better. After a brief discussion we decided that patient would proceed with 2nd dose of prep as instructed. If unable to tolerate it then procedure would need to be cancelled and rescheduled, probably using a different bowel prep.

## 2019-12-27 NOTE — Progress Notes (Signed)
Vs CW ° °

## 2019-12-27 NOTE — Progress Notes (Signed)
Report to PACU, RN, vss, BBS= Clear.  

## 2019-12-27 NOTE — Op Note (Signed)
[place] Patient Name: Joseph [name] Procedure Date: 12/27/2019 8:17 AM MRN: [id number] Endoscopist: Docia Chuck. Henrene Pastor , MD Age: 64 Referring MD:  Date of Birth: 11-21-1955 Gender: Male Account #: [id number] Procedure:                Colonoscopy with cold snare polypectomy x 2 Indications:              Screening for colorectal malignant neoplasm. Prior                            colonoscopy with Dr. Earlean Shawl 2008 "giant right-sided                            polyp" (pathology uncertain) removed and marking                            tattoo placed. Follow-up with Dr. Earlean Shawl 2010                            negative for neoplasia. This is the first follow-up                            examination since. He is also status post sigmoid                            resection due to perforated diverticulitis. Medicines:                Monitored Anesthesia Care Procedure:                Pre-Anesthesia Assessment:                           - Prior to the procedure, a History and Physical                            was performed, and patient medications and                            allergies were reviewed. The patient's tolerance of                            previous anesthesia was also reviewed. The risks                            and benefits of the procedure and the sedation                            options and risks were discussed with the patient.                            All questions were answered, and informed consent                            was obtained. Prior Anticoagulants: The patient has  taken no previous anticoagulant or antiplatelet                            agents. ASA Grade Assessment: III - A patient with                            severe systemic disease. After reviewing the risks                            and benefits, the patient was deemed in                            satisfactory condition to undergo the procedure.                            After obtaining informed consent, the colonoscope                            was passed under direct vision. Throughout the                            procedure, the patient's blood pressure, pulse, and                            oxygen saturations were monitored continuously. The                            Colonoscope was introduced through the anus and                            advanced to the the cecum, identified by                            appendiceal orifice and ileocecal valve. The                            ileocecal valve, appendiceal orifice, and rectum                            were photographed. The quality of the bowel                            preparation was excellent. The colonoscopy was                            performed without difficulty. The patient tolerated                            the procedure well. The bowel preparation used was                            SUPREP via split dose instruction. Scope In: 8:46:21 AM Scope Out: 8:57:51 AM Scope Withdrawal Time: 0 hours 7 minutes 52 seconds  Total Procedure Duration: 0  hours 11 minutes 30 seconds  Findings:                 Two polyps were found in the descending colon and                            ascending colon. The polyps were 2 to 4 mm in size.                            These polyps were removed with a cold snare.                            Resection and retrieval were complete.                           Multiple diverticula were found in the left colon.                            There was evidence of prior sigmoid resection with                            an unremarkable appearing anastomosis at 20 cm from                            the anal verge. As well, prior marking tattoo in                            the right colon was observed. No evidence of                            neoplasia in that region.                           The exam was otherwise without abnormality on                             direct and retroflexion views. Complications:            No immediate complications. Estimated blood loss:                            None. Estimated Blood Loss:     Estimated blood loss: none. Impression:               - Two 2 to 4 mm polyps in the descending colon and                            in the ascending colon, removed with a cold snare.                            Resected and retrieved.                           - Diverticulosis in the left colon. Prior sigmoid  resection. Incidental marking tattoo right colon.                           - The examination was otherwise normal on direct                            and retroflexion views. Recommendation:           - Repeat colonoscopy in 5 years for surveillance.                           - Patient has a contact number available for                            emergencies. The signs and symptoms of potential                            delayed complications were discussed with the                            patient. Return to normal activities tomorrow.                            Written discharge instructions were provided to the                            patient.                           - Resume previous diet.                           - Continue present medications.                           - Await pathology results.                           - Try to obtain pathology report from 2008, if                            possible, to most accurately ascribe proper                            follow-up surveillance interval Docia Chuck. Henrene Pastor, MD 12/27/2019 9:14:06 AM This report has been signed electronically.

## 2019-12-27 NOTE — Patient Instructions (Signed)
YOU HAD AN ENDOSCOPIC PROCEDURE TODAY AT THE [facility]:   Refer to the procedure report that was given to you for any specific questions about what was found during the examination.  If the procedure report does not answer your questions, please call your gastroenterologist to clarify.  If you requested that your care partner not be given the details of your procedure findings, then the procedure report has been included in a sealed envelope for you to review at your convenience later.  YOU SHOULD EXPECT: Some feelings of bloating in the abdomen. Passage of more gas than usual.  Walking can help get rid of the air that was put into your GI tract during the procedure and reduce the bloating. If you had a lower endoscopy (such as a colonoscopy or flexible sigmoidoscopy) you may notice spotting of blood in your stool or on the toilet paper. If you underwent a bowel prep for your procedure, you may not have a normal bowel movement for a few days.  Please Note:  You might notice some irritation and congestion in your nose or some drainage.  This is from the oxygen used during your procedure.  There is no need for concern and it should clear up in a day or so.  SYMPTOMS TO REPORT IMMEDIATELY:   Following lower endoscopy (colonoscopy or flexible sigmoidoscopy):  Excessive amounts of blood in the stool  Significant tenderness or worsening of abdominal pains  Swelling of the abdomen that is new, acute  Fever of 100F or higher  For urgent or emergent issues, a gastroenterologist can be reached at any hour by calling [phone]. Do not use MyChart messaging for urgent concerns.    DIET:  We do recommend a small meal at first, but then you may proceed to your regular diet.  Drink plenty of fluids but you should avoid alcoholic beverages for 24 hours.  ACTIVITY:  You should plan to take it easy for the rest of today and you should NOT DRIVE or use heavy machinery until tomorrow (because  of the sedation medicines used during the test).    FOLLOW UP: Our staff will call the number listed on your records 48-72 hours following your procedure to check on you and address any questions or concerns that you may have regarding the information given to you following your procedure. If we do not reach you, we will leave a message.  We will attempt to reach you two times.  During this call, we will ask if you have developed any symptoms of COVID 19. If you develop any symptoms (ie: fever, flu-like symptoms, shortness of breath, cough etc.) before then, please call [phone].  If you test positive for Covid 19 in the 2 weeks post procedure, please call and report this information to us.    If any biopsies were taken you will be contacted by phone or by letter within the next 1-3 weeks.  Please call us at [phone] if you have not heard about the biopsies in 3 weeks.    SIGNATURES/CONFIDENTIALITY: You and/or your care partner have signed paperwork which will be entered into your electronic medical record.  These signatures attest to the fact that that the information above on your After Visit Summary has been reviewed and is understood.  Full responsibility of the confidentiality of this discharge information lies with you and/or your care-partner. 

## 2019-12-27 NOTE — Progress Notes (Signed)
Called to room to assist during endoscopic procedure.  Patient ID and intended procedure confirmed with present staff. Received instructions for my participation in the procedure from the performing physician.  

## 2019-12-28 ENCOUNTER — Telehealth: Payer: Self-pay

## 2019-12-28 NOTE — Telephone Encounter (Signed)
-----   Message from Irene Shipper, MD sent at 12/27/2019  9:14 AM EDT ----- Regarding: Prior pathology report Joseph [name],This patient had a colonoscopy with polypectomy in 2008 with Dr. Earlean Shawl.  Please see if you can find the pathology report from that examination.  Thank you.JP

## 2019-12-28 NOTE — Telephone Encounter (Signed)
Stat request faxed to HIM department to [facility] to request path from colon done by Dr. Earlean Shawl in 2008. Faxed to [phone].

## 2019-12-31 ENCOUNTER — Telehealth: Payer: Self-pay | Admitting: *Deleted

## 2019-12-31 NOTE — Telephone Encounter (Signed)
1. Have you developed a fever since your procedure? no  2.   Have you had an respiratory symptoms (SOB or cough) since your procedure? no  3.   Have you tested positive for COVID 19 since your procedure no  4.   Have you had any family members/close contacts diagnosed with the COVID 19 since your procedure?  no   If yes to any of these questions please route to Joylene John, RN and Joella Prince, RN Follow up Call-  Call back number 12/27/2019  Post procedure Call Back phone  # [phone]  Permission to leave phone message Yes  Some recent data might be hidden     Patient questions:  Do you have a fever, pain , or abdominal swelling? No. Pain Score  0 *  Have you tolerated food without any problems? Yes.    Have you been able to return to your normal activities? Yes.    Do you have any questions about your discharge instructions: Diet   No. Medications  No. Follow up visit  No.  Do you have questions or concerns about your Care? No.  Actions: * If pain score is 4 or above: No action needed, pain <4.

## 2020-01-01 ENCOUNTER — Encounter: Payer: Self-pay | Admitting: Internal Medicine

## 2020-01-13 ENCOUNTER — Other Ambulatory Visit: Payer: Self-pay | Admitting: Cardiovascular Disease

## 2020-01-16 ENCOUNTER — Ambulatory Visit (INDEPENDENT_AMBULATORY_CARE_PROVIDER_SITE_OTHER): Payer: Medicare Other

## 2020-01-16 ENCOUNTER — Other Ambulatory Visit: Payer: Self-pay

## 2020-01-16 DIAGNOSIS — Z23 Encounter for immunization: Secondary | ICD-10-CM | POA: Diagnosis not present

## 2020-01-22 DIAGNOSIS — H40053 Ocular hypertension, bilateral: Secondary | ICD-10-CM | POA: Diagnosis not present

## 2020-01-22 DIAGNOSIS — E119 Type 2 diabetes mellitus without complications: Secondary | ICD-10-CM | POA: Diagnosis not present

## 2020-01-22 LAB — HM DIABETES EYE EXAM

## 2020-02-08 DIAGNOSIS — Z23 Encounter for immunization: Secondary | ICD-10-CM | POA: Diagnosis not present

## 2020-02-10 ENCOUNTER — Other Ambulatory Visit: Payer: Self-pay | Admitting: Internal Medicine

## 2020-02-11 ENCOUNTER — Ambulatory Visit: Payer: Self-pay

## 2020-02-11 ENCOUNTER — Ambulatory Visit: Payer: Medicare Other

## 2020-02-11 NOTE — Telephone Encounter (Signed)
Last filled 12-17-19 #30 Last OV 08-29-19 Next OV 03-19-20 [facility] [address] [address]

## 2020-03-19 ENCOUNTER — Encounter: Payer: Self-pay | Admitting: Internal Medicine

## 2020-03-19 ENCOUNTER — Other Ambulatory Visit: Payer: Self-pay

## 2020-03-19 ENCOUNTER — Ambulatory Visit (INDEPENDENT_AMBULATORY_CARE_PROVIDER_SITE_OTHER): Payer: Medicare Other | Admitting: Internal Medicine

## 2020-03-19 VITALS — BP 134/86 | HR 64 | Temp 96.9°F | Ht 69.0 in | Wt 161.0 lb

## 2020-03-19 DIAGNOSIS — F39 Unspecified mood [affective] disorder: Secondary | ICD-10-CM

## 2020-03-19 DIAGNOSIS — Z0000 Encounter for general adult medical examination without abnormal findings: Secondary | ICD-10-CM

## 2020-03-19 DIAGNOSIS — Z7189 Other specified counseling: Secondary | ICD-10-CM | POA: Diagnosis not present

## 2020-03-19 DIAGNOSIS — Z125 Encounter for screening for malignant neoplasm of prostate: Secondary | ICD-10-CM

## 2020-03-19 DIAGNOSIS — I25810 Atherosclerosis of coronary artery bypass graft(s) without angina pectoris: Secondary | ICD-10-CM

## 2020-03-19 DIAGNOSIS — K219 Gastro-esophageal reflux disease without esophagitis: Secondary | ICD-10-CM | POA: Diagnosis not present

## 2020-03-19 DIAGNOSIS — D696 Thrombocytopenia, unspecified: Secondary | ICD-10-CM | POA: Diagnosis not present

## 2020-03-19 DIAGNOSIS — I25119 Atherosclerotic heart disease of native coronary artery with unspecified angina pectoris: Secondary | ICD-10-CM

## 2020-03-19 DIAGNOSIS — E1159 Type 2 diabetes mellitus with other circulatory complications: Secondary | ICD-10-CM | POA: Diagnosis not present

## 2020-03-19 LAB — LIPID PANEL
Cholesterol: 107 mg/dL (ref 0–200)
HDL: 29.8 mg/dL — ABNORMAL LOW (ref >39.00)
LDL Cholesterol: 55 mg/dL (ref 0–99)
NonHDL: 77.37
Total CHOL/HDL Ratio: 4
Triglycerides: 111 mg/dL (ref 0.0–149.0)
VLDL: 22.2 mg/dL (ref 0.0–40.0)

## 2020-03-19 LAB — COMPREHENSIVE METABOLIC PANEL
ALT: 48 U/L (ref 0–53)
AST: 24 U/L (ref 0–37)
Albumin: 4.5 g/dL (ref 3.5–5.2)
Alkaline Phosphatase: 53 U/L (ref 39–117)
BUN: 17 mg/dL (ref 6–23)
CO2: 29 mEq/L (ref 19–32)
Calcium: 9.1 mg/dL (ref 8.4–10.5)
Chloride: 103 mEq/L (ref 96–112)
Creatinine, Ser: 1.26 mg/dL (ref 0.40–1.50)
GFR: 59.47 mL/min — ABNORMAL LOW (ref >60.00)
Glucose, Bld: 126 mg/dL — ABNORMAL HIGH (ref 70–99)
Potassium: 4.5 mEq/L (ref 3.5–5.1)
Sodium: 138 mEq/L (ref 135–145)
Total Bilirubin: 0.6 mg/dL (ref 0.2–1.2)
Total Protein: 7 g/dL (ref 6.0–8.3)

## 2020-03-19 LAB — PSA, MEDICARE: PSA: 1.04 ng/ml (ref 0.10–4.00)

## 2020-03-19 LAB — HEMOGLOBIN A1C: Hgb A1c MFr Bld: 5.9 % (ref 4.6–6.5)

## 2020-03-19 LAB — CBC
HCT: 44 % (ref 39.0–52.0)
Hemoglobin: 15.3 g/dL (ref 13.0–17.0)
MCHC: 34.9 g/dL (ref 30.0–36.0)
MCV: 91.1 fl (ref 78.0–100.0)
Platelets: 135 10*3/uL — ABNORMAL LOW (ref 150.0–400.0)
RBC: 4.83 Mil/uL (ref 4.22–5.81)
RDW: 13.9 % (ref 11.5–15.5)
WBC: 5 10*3/uL (ref 4.0–10.5)

## 2020-03-19 NOTE — Progress Notes (Signed)
Hearing Screening   Method: Audiometry   125Hz 250Hz 500Hz 1000Hz 2000Hz 3000Hz 4000Hz 6000Hz 8000Hz  Right ear:   20 20 20  20    Left ear:   20 20 20  20    Vision Screening Comments: October 2021   

## 2020-03-19 NOTE — Assessment & Plan Note (Signed)
I have personally reviewed the Medicare Annual Wellness questionnaire and have noted 1. The patient's medical and social history 2. Their use of alcohol, tobacco or illicit drugs 3. Their current medications and supplements 4. The patient's functional ability including ADL's, fall risks, home safety risks and hearing or visual             impairment. 5. Diet and physical activities 6. Evidence for depression or mood disorders  The patients weight, height, BMI and visual acuity have been recorded in the chart I have made referrals, counseling and provided education to the patient based review of the above and I have provided the pt with a written personalized care plan for preventive services.  I have provided you with a copy of your personalized plan for preventive services. Please take the time to review along with your updated medication list.  Had COVID booster and flu vaccine Discussed PSA --will check Colon due 2021 Discussed exercise Consider shingirx at pharmacy

## 2020-03-19 NOTE — Assessment & Plan Note (Signed)
Quiet on omeprazole 

## 2020-03-19 NOTE — Assessment & Plan Note (Signed)
Seems to have good control on metformin If has declined---will increase back to bid

## 2020-03-19 NOTE — Progress Notes (Addendum)
Subjective:    Patient ID: TYRUS WILMS, male    DOB: [birth year], 64 y.o.   MRN: [id number]  HPI Here for Medicare wellness visit and follow up of chronic health conditions This visit occurred during the SARS-CoV-2 public health emergency.  Safety protocols were in place, including screening questions prior to the visit, additional usage of staff PPE, and extensive cleaning of exam room while observing appropriate contact time as indicated for disinfecting solutions.   Reviewed form and advanced directives Reviewed other doctors Occasional drink of alcohol No tobacco Tries to walk regularly Vision and hearing are fine No falls Independent with instrumental ADLs No memory issues--just some recall issues  No new issues Did have cardiac cath which looked okay Had some chest pain at times--very brief. Not really exertional No change in exercise tolerance Does have some stable DOE---walks 35-40 minutes (may need to slow down at times) No edema No palpitations No dizziness or syncope  Checks sugars most days Usually 120-150 fasting Just on metformin--daily No foot numbness or pain---just occ plantar fasciitis pain  No heartburn or dysphagia Takes the omeprazole daily  Uses trazodone for sleep Some anxiety but nothing striking Uses xanax at bedtime also No depression or anhedonia  No abnormal bruising or bleeding  Current Outpatient Medications on File Prior to Visit  Medication Sig Dispense Refill  . ALPRAZolam (XANAX) 0.5 MG tablet TAKE 1/2 TABLET BY MOUTH EVERY DAY AT BEDTIME 30 tablet 0  . aspirin (ASPIR-81) 81 MG EC tablet Take 81 mg by mouth daily.     Marland Kitchen atorvastatin (LIPITOR) 20 MG tablet Take 1 tablet (20 mg total) by mouth daily. 90 tablet 3  . cyclobenzaprine (FLEXERIL) 10 MG tablet Take 0.5-1 tablets (5-10 mg total) by mouth 3 (three) times daily as needed for muscle spasms. 30 tablet 1  . glucose blood (ONETOUCH VERIO) test strip Use to check blood sugar once  a day. Dx Code E11.9 100 each 12  . ibuprofen (ADVIL,MOTRIN) 200 MG tablet Take 400 mg by mouth 2 (two) times daily as needed (pain).     Elmore Guise Devices (ONE TOUCH DELICA LANCING DEV) MISC Use to obtain blood sample for glucose Dx Code E11.9 1 each 0  . losartan (COZAAR) 50 MG tablet TAKE 1 TABLET BY MOUTH EVERY DAY (Patient taking differently: Take 50 mg by mouth daily. ) 90 tablet 3  . metFORMIN (GLUCOPHAGE-XR) 750 MG 24 hr tablet TAKE 1 TABLET (750 MG TOTAL) BY MOUTH DAILY WITH BREAKFAST. 90 tablet 3  . metoprolol succinate (TOPROL-XL) 50 MG 24 hr tablet TAKE 1 TABLET (50 MG TOTAL) BY MOUTH DAILY. TAKE WITH OR IMMEDIATELY FOLLOWING A MEAL. 90 tablet 3  . nitroGLYCERIN (NITROSTAT) 0.4 MG SL tablet Place 1 tablet (0.4 mg total) under the tongue every 5 (five) minutes as needed for chest pain. 25 tablet 6  . Omega-3 Fatty Acids (FISH OIL) 1000 MG CAPS Take 1,000 mg by mouth daily.     Marland Kitchen omeprazole (PRILOSEC) 40 MG capsule TAKE 1 CAPSULE (40 MG TOTAL) BY MOUTH DAILY. 90 capsule 3  . OneTouch Delica Lancets 23J MISC 1 each by Does not apply route daily. Use to obtain sample for glucose level Dx Code E11.9 100 each 3  . traZODone (DESYREL) 100 MG tablet TAKE 1.5 TABLETS (150 MG TOTAL) BY MOUTH AT BEDTIME AS NEEDED FOR SLEEP. (Patient taking differently: Take 100 mg by mouth at bedtime. Takes 1 tab nightly) 135 tablet 3  . [DISCONTINUED] pravastatin (PRAVACHOL) 40  MG tablet TAKE 1 TABLET BY MOUTH DAILY. 90 tablet 3   No current facility-administered medications on file prior to visit.    Allergies  Allergen Reactions  . Zolpidem Other (See Comments)    hallucinate  . Cephalexin Hives  . Keflin [Cephalothin]     rash  . Latex Dermatitis    Bandages and adhesives/ poss to latex  . Lisinopril Cough    REACTION: cough  . Zolpidem Tartrate     UNKNOWN  . Lamisil [Terbinafine] Rash    Rash/itchy    Past Medical History:  Diagnosis Date  . Allergy   . Anxiety   . Arthritis    knees and  hip  . CAD (coronary artery disease)    s/p emergent 4 V CABG (4/10) w 2/4 patent grafts by cath 08/11/09, patent LIMA to LAD & SVG to OM w moderate disease native RCA & occluded grafts to RCA and diagonal  . Cataract   . Chest pain    secondary to sternal non-union post CABg-now s/p sternotomy w repair 03/28/09 at [facility], dr. Lunette Stands  . Diverticulitis    with perforated colon  . Diverticulitis    2014  . Fatty liver    non-alcoholic  . GERD (gastroesophageal reflux disease)    with Schatzki ring  . Glaucoma    "slight per pt"  . History of chicken pox   . HTN (hypertension)    controlled  . Hypercholesterolemia   . 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 Myocardial infarction ([place]) 9449,6759  . Nephrolithiasis   . Normal echocardiogram 5/10   EF 60, apical & apicallateral HK, PASP 46mmHg. Repeat at [facility] w reported ef of 40%.   . Pneumonia 2016   in past   . Restless leg syndrome   . Schatzki's ring   . Type 2 diabetes mellitus, controlled ([place]) 6/15   type 2    Past Surgical History:  Procedure Laterality Date  . CATARACT EXTRACTION W/PHACO Right 08/18/2016   Procedure: CATARACT EXTRACTION PHACO AND INTRAOCULAR LENS PLACEMENT ([place]) Right diabetic;  Surgeon: Leandrew Koyanagi, MD;  Location: [facility];  Service: Ophthalmology;  Laterality: Right;  Diabetic-oral meds poss Latex allergy  . CATARACT EXTRACTION W/PHACO Left 09/15/2016   Procedure: CATARACT EXTRACTION PHACO AND INTRAOCULAR LENS PLACEMENT ([place])  Left diabetic;  Surgeon: Leandrew Koyanagi, MD;  Location: [facility];  Service: Ophthalmology;  Laterality: Left;  Diabetic - oral meds  . COLON SURGERY  2014   for perforated colon- diverticulitis  . COLONOSCOPY  [id number]   2010 colon normal- 2003 HPP x 1   . CORONARY ANGIOPLASTY  2003   stentx2  . CORONARY ARTERY BYPASS GRAFT  08/16/2008   x4  . ESOPHAGEAL DILATION  ~2009   Dr Earlean Shawl  . ILEOSTOMY  2014    with reversal-- after diverticulitis. Sigmoid colectomy also. Dr Ely/Bird  . INGUINAL HERNIA REPAIR Left x2   Dr Ernst Spell  . LEFT HEART CATH AND CORS/GRAFTS ANGIOGRAPHY N/A 09/27/2019   Procedure: LEFT HEART CATH AND CORS/GRAFTS ANGIOGRAPHY;  Surgeon: Burnell Blanks, MD;  Location: [facility];  Service: Cardiovascular;  Laterality: N/A;  . PILONIDAL CYST EXCISION    . pilonidal sinus tract excision    . POLYPECTOMY  2007   HPP x 1   . STERNUM SEPARATION REPAIR  03/2009   [place]  . UPPER GASTROINTESTINAL ENDOSCOPY    . VASECTOMY      Family  History  Problem Relation Age of Onset  . Heart failure Mother   . Diabetes Mother   . Heart disease Mother   . Congestive Heart Failure Mother        cause of death  . Cancer Mother        ovarian?  . Heart attack Father   . Heart attack Brother   . Diabetes Brother   . Heart attack Brother   . Diabetes Brother   . Heart attack Sister   . Diabetes Sister   . Cancer Maternal Aunt        died of breast cancer  . Breast cancer Maternal Aunt   . Cancer Maternal Grandmother        died of bone cancer  . Bone cancer Maternal Grandmother   . Colon cancer Neg Hx   . Colon polyps Neg Hx   . Esophageal cancer Neg Hx   . Rectal cancer Neg Hx   . Stomach cancer Neg Hx     Social History   Socioeconomic History  . Marital status: Married    Spouse name: Not on file  . Number of children: 3  . Years of education: Not on file  . Highest education level: Not on file  Occupational History  . Occupation: Animator    Comment: Disabled  . Occupation: Florist delivery    Comment: part time  Tobacco Use  . Smoking status: Former Smoker    Packs/day: 0.50    Years: 37.00    Pack years: 18.50    Types: Cigarettes    Quit date: 07/18/2008    Years since quitting: 11.6  . Smokeless tobacco: Never Used  . Tobacco comment: No smoking since bypass in 4/10  Vaping Use  . Vaping Use:  Never used  Substance and Sexual Activity  . Alcohol use: Yes    Alcohol/week: 0.0 standard drinks    Comment: rarely  . Drug use: No  . Sexual activity: Not on file  Other Topics Concern  . Not on file  Social History Narrative   Has living will   Wife is health care POA---alternate is daughter Ria Comment   Would accept resuscitation   Not sure about tube feeds--probably not prolonged   Social Determinants of Health   Financial Resource Strain:   . Difficulty of Paying Living Expenses: Not on file  Food Insecurity:   . Worried About Charity fundraiser in the Last Year: Not on file  . Ran Out of Food in the Last Year: Not on file  Transportation Needs:   . Lack of Transportation (Medical): Not on file  . Lack of Transportation (Non-Medical): Not on file  Physical Activity:   . Days of Exercise per Week: Not on file  . Minutes of Exercise per Session: Not on file  Stress:   . Feeling of Stress : Not on file  Social Connections:   . Frequency of Communication with Friends and Family: Not on file  . Frequency of Social Gatherings with Friends and Family: Not on file  . Attends Religious Services: Not on file  . Active Member of Clubs or Organizations: Not on file  . Attends Archivist Meetings: Not on file  . Marital Status: Not on file  Intimate Partner Violence:   . Fear of Current or Ex-Partner: Not on file  . Emotionally Abused: Not on file  . Physically Abused: Not on file  . Sexually Abused: Not on file  Review of Systems Appetite is good--less than in the past Weight down a little with better eating Wears seat belt Teeth are okay---overdue for dentist Mild rosacea--and athlete's foot---Rx from Dr Nehemiah Massed Regular back and knee pain---and some hip pain. Ibuprofen helps--once or twice a week (400mg ) Bowels are fine---no blood Voids okay. Good stream. Nocturia is occasional only    Objective:   Physical Exam Constitutional:      Appearance: Normal  appearance.  HENT:     Mouth/Throat:     Comments: No lesions Eyes:     Conjunctiva/sclera: Conjunctivae normal.     Pupils: Pupils are equal, round, and reactive to light.  Cardiovascular:     Rate and Rhythm: Normal rate and regular rhythm.     Pulses: Normal pulses.     Heart sounds: No murmur heard.  No gallop.   Pulmonary:     Effort: Pulmonary effort is normal.     Breath sounds: Normal breath sounds. No wheezing or rales.  Abdominal:     Palpations: Abdomen is soft.     Tenderness: There is no abdominal tenderness.  Musculoskeletal:     Cervical back: Neck supple.     Right lower leg: No edema.     Left lower leg: No edema.  Lymphadenopathy:     Cervical: No cervical adenopathy.  Skin:    General: Skin is warm.     Findings: No rash.  Neurological:     Mental Status: He is alert and oriented to person, place, and time.     Comments: President--"Joseph R Balinda Quails Obama" [id number] D-l-r-[id number]-w Recall 3/3  Psychiatric:        Mood and Affect: Mood normal.        Behavior: Behavior normal.            Assessment & Plan:

## 2020-03-19 NOTE — Assessment & Plan Note (Signed)
Mild anxiety and sleep problems No depression Does okay with trazodone and xanax at night

## 2020-03-19 NOTE — Assessment & Plan Note (Signed)
Mild vague chest pain and stable DOE Recent cath okay Continues on statin, ASA, losartan and metoprolol

## 2020-03-19 NOTE — Assessment & Plan Note (Signed)
See social history 

## 2020-03-20 DIAGNOSIS — D696 Thrombocytopenia, unspecified: Secondary | ICD-10-CM | POA: Insufficient documentation

## 2020-03-20 NOTE — Assessment & Plan Note (Signed)
Mildly low without abnormal bleeding No action for now

## 2020-04-06 ENCOUNTER — Other Ambulatory Visit: Payer: Self-pay | Admitting: Internal Medicine

## 2020-04-07 NOTE — Telephone Encounter (Signed)
Last filled 02-14-20 #30 Last OV 03-19-20 Next OV [facility]-17-20 [facility] [address] [address]

## 2020-05-19 ENCOUNTER — Other Ambulatory Visit: Payer: Self-pay | Admitting: Internal Medicine

## 2020-06-08 ENCOUNTER — Other Ambulatory Visit: Payer: Self-pay | Admitting: Internal Medicine

## 2020-06-09 NOTE — Telephone Encounter (Signed)
Last filled 04-13-20 #30 Last OV 03-19-20 Next OV [facility]-17-20 [facility] [address] [address]

## 2020-07-16 NOTE — Progress Notes (Signed)
Chief Complaint  Patient presents with  . Follow-up    CAD    History of Present Illness: 65 yo male with history of CAD s/p 4V CABG, DM, GERD and nephrolithiasis who is here today for cardiac follow up. He was admitted to [facility] in April 2010 with an anterior STEMI. Emergent cath demonstrated a patent prior stent in the ostial LAD extending into the mid LAD. There was a hazy 99% stenosis in the mid LAD that was felt to be the culprit. PCI failed after multiple attempts, followed by ventricular fibrillation and the pt was taken emergently to the operating room for bypass. Emergent CABG was performed by Dr. Roxan Hockey with 4 vessels bypassed (LIMA to LAD, SVG to D2, SVG to OM2, SVG to distal RCA). After surgery, he had problems with chest wall pain and went to [facility] for evaluation by CT surgery. He underwent sternotomy with removal of sternal wires and revision by Dr. Lunette Stands on 03/28/09. He was admitted to [facility] April 2011 with chest pain. Cardiac cath repeated and showed patent LIMA to LAD, patent SVG to OM with occluded SVG to Diagonal and occluded SVG to RCA. EF was 45%. He did well over the next ten years. I saw him April 2021 and he c/o resting chest pain. Exercise stress test May 2021 with exercise induced polymorphic VT. He was seen by Dr. Caryl Comes who recommended cardiac cath and restarting his beta blocker. Cardiac cath May 2021 with 2/4 patent bypass grafts, chronic occlusion of the mid LAD and moderate proximal RCA stenosis that was unchanged from last cath. The vein graft to the RCA is known to be occluded. Echo May 2021 with LVEF=55-60%. No valve disease.   He is here today for follow up. The patient denies any chest pain, dyspnea, palpitations, lower extremity edema, orthopnea, PND, dizziness, near syncope or syncope.    Primary Care Physician: Venia Carbon, MD  Past Medical History:  Diagnosis Date  . Allergy   . Anxiety   . Arthritis    knees and hip  . CAD  (coronary artery disease)    s/p emergent 4 V CABG (4/10) w 2/4 patent grafts by cath 08/11/09, patent LIMA to LAD & SVG to OM w moderate disease native RCA & occluded grafts to RCA and diagonal  . Cataract   . Chest pain    secondary to sternal non-union post CABg-now s/p sternotomy w repair 03/28/09 at [facility], dr. Lunette Stands  . Diverticulitis    with perforated colon  . Diverticulitis    2014  . Fatty liver    non-alcoholic  . GERD (gastroesophageal reflux disease)    with Schatzki ring  . Glaucoma    "slight per pt"  . History of chicken pox   . HTN (hypertension)    controlled  . Hypercholesterolemia   . 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 Myocardial infarction ([place]) 4098,1191  . Nephrolithiasis   . Normal echocardiogram 5/10   EF 60, apical & apicallateral HK, PASP 53mmHg. Repeat at [facility] w reported ef of 40%.   . Pneumonia 2016   in past   . Restless leg syndrome   . Schatzki's ring   . Type 2 diabetes mellitus, controlled ([place]) 6/15   type 2    Past Surgical History:  Procedure Laterality Date  . CATARACT EXTRACTION W/PHACO Right 08/18/2016   Procedure: CATARACT EXTRACTION PHACO AND INTRAOCULAR LENS PLACEMENT (IOC) Right diabetic;  Surgeon: Leandrew Koyanagi, MD;  Location:  [place];  Service: Ophthalmology;  Laterality: Right;  Diabetic-oral meds poss Latex allergy  . CATARACT EXTRACTION W/PHACO Left 09/15/2016   Procedure: CATARACT EXTRACTION PHACO AND INTRAOCULAR LENS PLACEMENT ([place])  Left diabetic;  Surgeon: Leandrew Koyanagi, MD;  Location: [place];  Service: Ophthalmology;  Laterality: Left;  Diabetic - oral meds  . COLON SURGERY  2014   for perforated colon- diverticulitis  . COLONOSCOPY  4580,9983   2010 colon normal- 2003 HPP x 1   . CORONARY ANGIOPLASTY  2003   stentx2  . CORONARY ARTERY BYPASS GRAFT  08/16/2008   x4  . ESOPHAGEAL DILATION  ~2009   Dr Earlean Shawl  . ILEOSTOMY  2014   with  reversal-- after diverticulitis. Sigmoid colectomy also. Dr Ely/Bird  . INGUINAL HERNIA REPAIR Left x2   Dr Ernst Spell  . LEFT HEART CATH AND CORS/GRAFTS ANGIOGRAPHY N/A 09/27/2019   Procedure: LEFT HEART CATH AND CORS/GRAFTS ANGIOGRAPHY;  Surgeon: Burnell Blanks, MD;  Location: [place] [facility];  Service: Cardiovascular;  Laterality: N/A;  . PILONIDAL CYST EXCISION    . pilonidal sinus tract excision    . POLYPECTOMY  2007   HPP x 1   . STERNUM SEPARATION REPAIR  03/2009   [place]  . UPPER GASTROINTESTINAL ENDOSCOPY    . VASECTOMY      Current Outpatient Medications  Medication Sig Dispense Refill  . ALPRAZolam (XANAX) 0.5 MG tablet TAKE 1/2 TABLET BY MOUTH EVERY DAY AT BEDTIME 30 tablet 0  . aspirin 81 MG EC tablet Take 81 mg by mouth daily.     Marland Kitchen atorvastatin (LIPITOR) 20 MG tablet Take 1 tablet (20 mg total) by mouth daily. 90 tablet 3  . cyclobenzaprine (FLEXERIL) 10 MG tablet Take 0.5-1 tablets (5-10 mg total) by mouth 3 (three) times daily as needed for muscle spasms. 30 tablet 1  . glucose blood (ONETOUCH VERIO) test strip Use to check blood sugar once a day. Dx Code E11.9 100 each 12  . ibuprofen (ADVIL,MOTRIN) 200 MG tablet Take 400 mg by mouth 2 (two) times daily as needed (pain).     Elmore Guise Devices (ONE TOUCH DELICA LANCING DEV) MISC Use to obtain blood sample for glucose Dx Code E11.9 1 each 0  . losartan (COZAAR) 50 MG tablet TAKE 1 TABLET BY MOUTH EVERY DAY 90 tablet 3  . metFORMIN (GLUCOPHAGE-XR) 750 MG 24 hr tablet TAKE 1 TABLET (750 MG TOTAL) BY MOUTH DAILY WITH BREAKFAST. 90 tablet 3  . metoprolol succinate (TOPROL-XL) 50 MG 24 hr tablet TAKE 1 TABLET (50 MG TOTAL) BY MOUTH DAILY. TAKE WITH OR IMMEDIATELY FOLLOWING A MEAL. 90 tablet 3  . Omega-3 Fatty Acids (FISH OIL) 1000 MG CAPS Take 1,000 mg by mouth daily.    Marland Kitchen omeprazole (PRILOSEC) 40 MG capsule TAKE 1 CAPSULE (40 MG TOTAL) BY MOUTH DAILY. 90 capsule 3  . OneTouch Delica Lancets 38S  MISC 1 each by Does not apply route daily. Use to obtain sample for glucose level Dx Code E11.9 100 each 3  . traZODone (DESYREL) 100 MG tablet TAKE 1.5 TABLETS (150 MG TOTAL) BY MOUTH AT BEDTIME AS NEEDED FOR SLEEP. 135 tablet 3  . nitroGLYCERIN (NITROSTAT) 0.4 MG SL tablet Place 1 tablet (0.4 mg total) under the tongue every 5 (five) minutes as needed for chest pain. 25 tablet 6   No current facility-administered medications for this visit.    Allergies  Allergen Reactions  . Zolpidem Other (See  Comments)    hallucinate  . Cephalexin Hives  . Keflin [Cephalothin]     rash  . Latex Dermatitis    Bandages and adhesives/ poss to latex  . Lisinopril Cough    REACTION: cough  . Zolpidem Tartrate     UNKNOWN  . Lamisil [Terbinafine] Rash    Rash/itchy    History   Social History  . Marital Status: Married    Spouse Name: N/A    Number of Children: 3  . Years of Education: N/A   Occupational History  . Not on file.   Social History Main Topics  . Smoking status: Former Smoker    Quit date: 07/18/2008  . Smokeless tobacco: Not on file     Comment: No smoking since bypass in 4/10  . Alcohol Use: No       . Drug Use: No  . Sexually Active: Not on file   Other Topics Concern  . Not on file   Social History Narrative       Family History  Problem Relation Age of Onset  . Heart failure Mother   . Diabetes Mother   . Heart disease Mother   . Congestive Heart Failure Mother        cause of death  . Cancer Mother        ovarian?  . Heart attack Father   . Heart attack Brother   . Diabetes Brother   . Heart attack Brother   . Diabetes Brother   . Bladder Cancer Brother   . Heart attack Sister   . Diabetes Sister   . Cancer Maternal Aunt        died of breast cancer  . Breast cancer Maternal Aunt   . Cancer Maternal Grandmother        died of bone cancer  . Bone cancer Maternal Grandmother   . Colon cancer Neg Hx   . Colon polyps Neg Hx   . Esophageal  cancer Neg Hx   . Rectal cancer Neg Hx   . Stomach cancer Neg Hx     Review of Systems:  As stated in the HPI and otherwise negative.   BP 122/68   Pulse 67   Ht 5\' 9"  (1.753 m)   Wt 163 lb (73.9 kg)   SpO2 99%   BMI 24.07 kg/m   Physical Examination:  General: Well developed, well nourished, NAD  HEENT: OP clear, mucus membranes moist  SKIN: warm, dry. No rashes. Neuro: No focal deficits  Musculoskeletal: Muscle strength 5/5 all ext  Psychiatric: Mood and affect normal  Neck: No JVD, no carotid bruits, no thyromegaly, no lymphadenopathy.  Lungs:Clear bilaterally, no wheezes, rhonci, crackles Cardiovascular: Regular rate and rhythm. No murmurs, gallops or rubs. Abdomen:Soft. Bowel sounds present. Non-tender.  Extremities: No lower extremity edema. Pulses are 2 + in the bilateral DP/PT.  EKG:  EKG is not ordered today. The ekg ordered today demonstrates    Echo May 2021: 1. Left ventricular ejection fraction, by estimation, is 55 to 60%. The  left ventricle has normal function. The left ventricle has no regional  wall motion abnormalities. There is mild concentric left ventricular  hypertrophy. Left ventricular diastolic  parameters are consistent with Grade I diastolic dysfunction (impaired  relaxation).  2. Right ventricular systolic function is normal. The right ventricular  size is normal. There is normal pulmonary artery systolic pressure.  3. The mitral valve is normal in structure. No evidence of mitral valve  regurgitation.  No evidence of mitral stenosis.  4. The aortic valve is tricuspid. Aortic valve regurgitation is not  visualized. No aortic stenosis is present.  5. The inferior vena cava is normal in size with greater than 50%  respiratory variability, suggesting right atrial pressure of 3 mmHg.   Recent Labs: 03/19/2020: ALT 48; BUN 17; Creatinine, Ser 1.26; Hemoglobin 15.3; Platelets 135.0; Potassium 4.5; Sodium 138   Lipid Panel    Component  Value Date/Time   CHOL 107 03/19/2020 0923   CHOL 92 09/14/2012 1521   TRIG 111.0 03/19/2020 0923   TRIG 230 (H) 09/14/2012 1521   HDL 29.80 (L) 03/19/2020 0923   HDL 22 (L) 09/14/2012 1521   CHOLHDL 4 03/19/2020 0923   VLDL 22.2 03/19/2020 0923   VLDL 46 (H) 09/14/2012 1521   LDLCALC 55 03/19/2020 0923   LDLCALC 24 09/14/2012 1521   LDLDIRECT 67.0 02/23/2019 1228     Wt Readings from Last 3 Encounters:  07/17/20 163 lb (73.9 kg)  03/19/20 161 lb (73 kg)  12/27/19 161 lb (73 kg)     Other studies Reviewed: Additional studies/ records that were reviewed today include: . Review of the above records demonstrates:   Assessment and Plan:   1. CAD s/p CABG without angina: No exertional chest pain. CAD stable by cath in May 2021. Continue ASA, statin and beta blocker.    2. Tobacco abuse, in remission: No tobacco use since 2010.   3. HTN: BP is controlled. No changes.   4. Non-sustained ventricular tachycardia: No palpitations. LV function normal by echo May 2021. Continue beta blocker.   Current medicines are reviewed at length with the patient today.  The patient does not have concerns regarding medicines.  The following changes have been made:  no change  Labs/ tests ordered today include:   No orders of the defined types were placed in this encounter.   Disposition:   F/U with me in 12 months  Signed, [name], MD 07/17/2020 9:51 AM    [facility] [place], [place],   [address] Phone: [phone]; Fax: [phone]

## 2020-07-17 ENCOUNTER — Ambulatory Visit (INDEPENDENT_AMBULATORY_CARE_PROVIDER_SITE_OTHER): Payer: Medicare Other | Admitting: Cardiovascular Disease

## 2020-07-17 ENCOUNTER — Encounter: Payer: Self-pay | Admitting: Cardiovascular Disease

## 2020-07-17 ENCOUNTER — Other Ambulatory Visit: Payer: Self-pay

## 2020-07-17 VITALS — BP 122/68 | HR 67 | Ht 69.0 in | Wt 163.0 lb

## 2020-07-17 DIAGNOSIS — I10 Essential (primary) hypertension: Secondary | ICD-10-CM

## 2020-07-17 DIAGNOSIS — I472 Ventricular tachycardia, unspecified: Secondary | ICD-10-CM

## 2020-07-17 DIAGNOSIS — I25810 Atherosclerosis of coronary artery bypass graft(s) without angina pectoris: Secondary | ICD-10-CM | POA: Diagnosis not present

## 2020-07-17 MED ORDER — NITROGLYCERIN 0.4 MG SL SUBL: 0.4000 mg | SUBLINGUAL_TABLET | SUBLINGUAL | 6 refills | Status: DC | PRN

## 2020-07-17 NOTE — Patient Instructions (Signed)

## 2020-07-18 ENCOUNTER — Telehealth: Payer: Self-pay | Admitting: Internal Medicine

## 2020-07-18 ENCOUNTER — Other Ambulatory Visit: Payer: Self-pay | Admitting: Internal Medicine

## 2020-07-18 NOTE — Telephone Encounter (Signed)
Spoke to pt and wife. They agree with Dr Silvio Pate. They, too, keep up with the rates and will get it if the rates start going up.

## 2020-07-18 NOTE — Telephone Encounter (Signed)
Absolutely safe----but I recommend waiting till August or September unless if the rates start going up again (like community positive rates over 5% again)--then get it right away

## 2020-07-18 NOTE — Telephone Encounter (Signed)
Mr and Mrs. Mesta called in wanted to know if its okay to get the new [place] booster vaccine.  Please advise

## 2020-08-06 ENCOUNTER — Other Ambulatory Visit: Payer: Self-pay | Admitting: Internal Medicine

## 2020-08-06 NOTE — Telephone Encounter (Signed)
Last filled 06-09-20 #30 Last OV 03-19-20 [facility] [address] [address]

## 2020-08-21 DIAGNOSIS — Z23 Encounter for immunization: Secondary | ICD-10-CM | POA: Diagnosis not present

## 2020-09-11 ENCOUNTER — Other Ambulatory Visit: Payer: Self-pay | Admitting: Cardiovascular Disease

## 2020-09-17 ENCOUNTER — Encounter: Payer: Self-pay | Admitting: Internal Medicine

## 2020-09-17 ENCOUNTER — Other Ambulatory Visit: Payer: Self-pay

## 2020-09-17 ENCOUNTER — Ambulatory Visit (INDEPENDENT_AMBULATORY_CARE_PROVIDER_SITE_OTHER): Payer: Medicare Other | Admitting: Internal Medicine

## 2020-09-17 VITALS — BP 124/80 | HR 63 | Temp 96.9°F | Ht 69.0 in | Wt 160.0 lb

## 2020-09-17 DIAGNOSIS — E1159 Type 2 diabetes mellitus with other circulatory complications: Secondary | ICD-10-CM | POA: Diagnosis not present

## 2020-09-17 DIAGNOSIS — I25810 Atherosclerosis of coronary artery bypass graft(s) without angina pectoris: Secondary | ICD-10-CM

## 2020-09-17 DIAGNOSIS — I2510 Atherosclerotic heart disease of native coronary artery without angina pectoris: Secondary | ICD-10-CM

## 2020-09-17 LAB — POCT GLYCOSYLATED HEMOGLOBIN (HGB A1C): Hemoglobin A1C: 5.7 % — AB (ref 4.0–5.6)

## 2020-09-17 LAB — HM DIABETES FOOT EXAM

## 2020-09-17 NOTE — Progress Notes (Signed)
Subjective:    Patient ID: Joseph [name], male    DOB: 1955/11/01, 65 y.o.   MRN: [id number]  HPI Here for diabetes follow up This visit occurred during the SARS-CoV-2 public health emergency.  Safety protocols were in place, including screening questions prior to the visit, additional usage of staff PPE, and extensive cleaning of exam room while observing appropriate contact time as indicated for disinfecting solutions.   Tests sugars every day--in AM Has been higher lately--highest 151 Diet is stable Weight slightly down No foot burning, numbness or tingling  Regular exercise---walking No resistance exercise still No chest pain No SOB No edema  Current Outpatient Medications on File Prior to Visit  Medication Sig Dispense Refill  . ALPRAZolam (XANAX) 0.5 MG tablet TAKE 1/2 TABLET BY MOUTH EVERY DAY AT BEDTIME 30 tablet 0  . aspirin 81 MG EC tablet Take 81 mg by mouth daily.     Marland Kitchen atorvastatin (LIPITOR) 20 MG tablet TAKE 1 TABLET BY MOUTH EVERY DAY 90 tablet 3  . cyclobenzaprine (FLEXERIL) 10 MG tablet Take 0.5-1 tablets (5-10 mg total) by mouth 3 (three) times daily as needed for muscle spasms. 30 tablet 1  . glucose blood (ONETOUCH VERIO) test strip Use to check blood sugar once a day. Dx Code E11.9 100 each 12  . ibuprofen (ADVIL,MOTRIN) 200 MG tablet Take 400 mg by mouth 2 (two) times daily as needed (pain).     Elmore Guise Devices (ONE TOUCH DELICA LANCING DEV) MISC Use to obtain blood sample for glucose Dx Code E11.9 1 each 0  . losartan (COZAAR) 50 MG tablet TAKE 1 TABLET BY MOUTH EVERY DAY 90 tablet 3  . metFORMIN (GLUCOPHAGE-XR) 750 MG 24 hr tablet TAKE 1 TABLET (750 MG TOTAL) BY MOUTH DAILY WITH BREAKFAST. 90 tablet 3  . metoprolol succinate (TOPROL-XL) 50 MG 24 hr tablet TAKE 1 TABLET (50 MG TOTAL) BY MOUTH DAILY. TAKE WITH OR IMMEDIATELY FOLLOWING A MEAL. 90 tablet 3  . nitroGLYCERIN (NITROSTAT) 0.4 MG SL tablet Place 1 tablet (0.4 mg total) under the tongue every 5  (five) minutes as needed for chest pain. 25 tablet 6  . Omega-3 Fatty Acids (FISH OIL) 1000 MG CAPS Take 1,000 mg by mouth daily.    Marland Kitchen omeprazole (PRILOSEC) 40 MG capsule TAKE 1 CAPSULE (40 MG TOTAL) BY MOUTH DAILY. 90 capsule 3  . OneTouch Delica Lancets 28N MISC 1 each by Does not apply route daily. Use to obtain sample for glucose level Dx Code E11.9 100 each 3  . traZODone (DESYREL) 100 MG tablet TAKE 1.5 TABLETS (150 MG TOTAL) BY MOUTH AT BEDTIME AS NEEDED FOR SLEEP. 135 tablet 3  . [DISCONTINUED] pravastatin (PRAVACHOL) 40 MG tablet TAKE 1 TABLET BY MOUTH DAILY. 90 tablet 3   No current facility-administered medications on file prior to visit.    Allergies  Allergen Reactions  . Zolpidem Other (See Comments)    hallucinate  . Cephalexin Hives  . Keflin [Cephalothin]     rash  . Latex Dermatitis    Bandages and adhesives/ poss to latex  . Lisinopril Cough    REACTION: cough  . Zolpidem Tartrate     UNKNOWN  . Lamisil [Terbinafine] Rash    Rash/itchy    Past Medical History:  Diagnosis Date  . Allergy   . Anxiety   . Arthritis    knees and hip  . CAD (coronary artery disease)    s/p emergent 4 V CABG (4/10) w 2/4 patent  grafts by cath 08/11/09, patent LIMA to LAD & SVG to OM w moderate disease native RCA & occluded grafts to RCA and diagonal  . Cataract   . Chest pain    secondary to sternal non-union post CABg-now s/p sternotomy w repair 03/28/09 at [facility], dr. Lunette Stands  . Diverticulitis    with perforated colon  . Diverticulitis    2014  . Fatty liver    non-alcoholic  . GERD (gastroesophageal reflux disease)    with Schatzki ring  . Glaucoma    "slight per pt"  . History of chicken pox   . HTN (hypertension)    controlled  . Hypercholesterolemia   . 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 Myocardial infarction ([place]) 2992,4268  . Nephrolithiasis   . Normal echocardiogram 5/10   EF 60, apical & apicallateral HK, PASP 62mmHg.  Repeat at [facility] w reported ef of 40%.   . Pneumonia 2016   in past   . Restless leg syndrome   . Schatzki's ring   . Type 2 diabetes mellitus, controlled ([place]) 6/15   type 2    Past Surgical History:  Procedure Laterality Date  . CATARACT EXTRACTION W/PHACO Right 08/18/2016   Procedure: CATARACT EXTRACTION PHACO AND INTRAOCULAR LENS PLACEMENT ([place]) Right diabetic;  Surgeon: Leandrew Koyanagi, MD;  Location: [place];  Service: Ophthalmology;  Laterality: Right;  Diabetic-oral meds poss Latex allergy  . CATARACT EXTRACTION W/PHACO Left 09/15/2016   Procedure: CATARACT EXTRACTION PHACO AND INTRAOCULAR LENS PLACEMENT ([place])  Left diabetic;  Surgeon: Leandrew Koyanagi, MD;  Location: [place];  Service: Ophthalmology;  Laterality: Left;  Diabetic - oral meds  . COLON SURGERY  2014   for perforated colon- diverticulitis  . COLONOSCOPY  3419,6222   2010 colon normal- 2003 HPP x 1   . CORONARY ANGIOPLASTY  2003   stentx2  . CORONARY ARTERY BYPASS GRAFT  08/16/2008   x4  . ESOPHAGEAL DILATION  ~2009   Dr Earlean Shawl  . ILEOSTOMY  2014   with reversal-- after diverticulitis. Sigmoid colectomy also. Dr Ely/Bird  . INGUINAL HERNIA REPAIR Left x2   Dr Ernst Spell  . LEFT HEART CATH AND CORS/GRAFTS ANGIOGRAPHY N/A 09/27/2019   Procedure: LEFT HEART CATH AND CORS/GRAFTS ANGIOGRAPHY;  Surgeon: Burnell Blanks, MD;  Location: [place] [facility];  Service: Cardiovascular;  Laterality: N/A;  . PILONIDAL CYST EXCISION    . pilonidal sinus tract excision    . POLYPECTOMY  2007   HPP x 1   . STERNUM SEPARATION REPAIR  03/2009   [place]  . UPPER GASTROINTESTINAL ENDOSCOPY    . VASECTOMY      Family History  Problem Relation Age of Onset  . Heart failure Mother   . Diabetes Mother   . Heart disease Mother   . Congestive Heart Failure Mother        cause of death  . Cancer Mother        ovarian?  . Heart attack Father   . Heart attack Brother    . Diabetes Brother   . Heart attack Brother   . Diabetes Brother   . Bladder Cancer Brother   . Heart attack Sister   . Diabetes Sister   . Cancer Maternal Aunt        died of breast cancer  . Breast cancer Maternal Aunt   . Cancer Maternal Grandmother        died of bone cancer  .  Bone cancer Maternal Grandmother   . Colon cancer Neg Hx   . Colon polyps Neg Hx   . Esophageal cancer Neg Hx   . Rectal cancer Neg Hx   . Stomach cancer Neg Hx     Social History   Socioeconomic History  . Marital status: Married    Spouse name: Not on file  . Number of children: 3  . Years of education: Not on file  . Highest education level: Not on file  Occupational History  . Occupation: Animator    Comment: Disabled  . Occupation: Florist delivery    Comment: part time  Tobacco Use  . Smoking status: Former Smoker    Packs/day: 0.50    Years: 37.00    Pack years: 18.50    Types: Cigarettes    Quit date: 07/18/2008    Years since quitting: 12.1  . Smokeless tobacco: Never Used  . Tobacco comment: No smoking since bypass in 4/10  Vaping Use  . Vaping Use: Never used  Substance and Sexual Activity  . Alcohol use: Yes    Alcohol/week: 0.0 standard drinks    Comment: rarely  . Drug use: No  . Sexual activity: Not on file  Other Topics Concern  . Not on file  Social History Narrative   Has living will   Wife is health care POA---alternate is daughter Ria Comment   Would accept resuscitation   Not sure about tube feeds--probably not prolonged   Social Determinants of Health   Financial Resource Strain: Not on file  Food Insecurity: Not on file  Transportation Needs: Not on file  Physical Activity: Not on file  Stress: Not on file  Social Connections: Not on file  Intimate Partner Violence: Not on file   Review of Systems Feet feel a bit cool Sleeps well---1/2 alprazolam and a trazodone    Objective:   Physical Exam Constitutional:      Appearance:  Normal appearance.  Cardiovascular:     Rate and Rhythm: Normal rate and regular rhythm.     Pulses: Normal pulses.     Heart sounds: No murmur heard. No gallop.   Pulmonary:     Effort: Pulmonary effort is normal.     Breath sounds: Normal breath sounds. No wheezing or rales.  Musculoskeletal:     Cervical back: Neck supple.     Right lower leg: No edema.     Left lower leg: No edema.  Lymphadenopathy:     Cervical: No cervical adenopathy.  Skin:    Findings: No rash.     Comments: No foot pain  Neurological:     Mental Status: He is alert.     Comments: Normal sensation in feet            Assessment & Plan:

## 2020-09-17 NOTE — Assessment & Plan Note (Signed)
No recent symptoms On losartan, metoprolol, statin, ASA

## 2020-09-17 NOTE — Assessment & Plan Note (Signed)
Doing well Lab Results  Component Value Date   HGBA1C 5.7 (A) 09/17/2020   Excellent control still on metformin Discussed testing at other times Quiet CAD

## 2020-10-02 ENCOUNTER — Other Ambulatory Visit: Payer: Self-pay | Admitting: Internal Medicine

## 2020-10-02 NOTE — Telephone Encounter (Signed)
Last filled 08-08-20 #30 Last OV 09-17-20 Next [facility]21 [facility] [address]

## 2020-12-01 ENCOUNTER — Other Ambulatory Visit: Payer: Self-pay | Admitting: Internal Medicine

## 2020-12-01 NOTE — Telephone Encounter (Signed)
Last filled 10-05-20 #30 Last OV 09-17-20 Next [facility]-20-21 [facility] [address] [address]

## 2021-01-05 ENCOUNTER — Telehealth: Payer: Self-pay

## 2021-01-05 ENCOUNTER — Ambulatory Visit: Payer: Medicare Other | Admitting: Nurse Practitioner

## 2021-01-05 NOTE — Telephone Encounter (Signed)
Spoke with patient and his wife patient needs an antibiotic prior to dental procedure. Patient is having pain with his tooth that he has ignored over the summer. Patient states his tooth probably needs filling or been pulled. Patient has not called a dentist office yet but knows he has history of heart issues and would need medication prior to any work.  Patient would like to know if he needs to go ahead and start taking antibiotic now or wait till he speaks with a dentist office and gets scheduled for procedure and then start taking medication?  Patient does not have a dentist so he will be calling around to see who can see him. Patient made an appointment to see Dr Silvio Pate on first available on 01/08/21 to address this issue also just in case and if Dr Silvio Pate does not feel like patient needs to come in for that appointment please let patient know and cancel.  [facility] [address] in [facility]

## 2021-01-05 NOTE — Telephone Encounter (Signed)
Spoke to pt. He will come to the visit 01-07-21 and he is looking for a dentist.

## 2021-01-06 NOTE — Telephone Encounter (Signed)
Would be OK to take expired medication per nursing recommendation.

## 2021-01-06 NOTE — Telephone Encounter (Signed)
Pt called back; pt has a few Hydrocodone apap 5-325 mg from 2014 and pt wants to know if can take that or since so old could it harm pt or not be effective. I advised it could have lost potency in that length of time. Pt is presently taking ibuprofen 400 mg q 5 - 5 1/2 hrs for pain. Pt has appt on 01/07/21 but pt wants to know what can take until seen. Sending note to Dr Silvio Pate who is off this afternoon and Dr Einar Pheasant who is in office and [facility] CMA. Pt request cb after reviewed by provider [facility].

## 2021-01-06 NOTE — Telephone Encounter (Signed)
Spoke to pt. He still plans to come to OV tomorrow.

## 2021-01-07 ENCOUNTER — Other Ambulatory Visit: Payer: Self-pay

## 2021-01-07 ENCOUNTER — Ambulatory Visit (INDEPENDENT_AMBULATORY_CARE_PROVIDER_SITE_OTHER): Payer: Medicare Other | Admitting: Internal Medicine

## 2021-01-07 ENCOUNTER — Encounter: Payer: Self-pay | Admitting: Internal Medicine

## 2021-01-07 VITALS — BP 130/82 | HR 61 | Temp 97.0°F | Ht 69.0 in | Wt 163.0 lb

## 2021-01-07 DIAGNOSIS — R519 Headache, unspecified: Secondary | ICD-10-CM | POA: Insufficient documentation

## 2021-01-07 DIAGNOSIS — I25810 Atherosclerosis of coronary artery bypass graft(s) without angina pectoris: Secondary | ICD-10-CM

## 2021-01-07 LAB — CBC
HCT: 43.2 % (ref 39.0–52.0)
Hemoglobin: 14.9 g/dL (ref 13.0–17.0)
MCHC: 34.6 g/dL (ref 30.0–36.0)
MCV: 92.7 fl (ref 78.0–100.0)
Platelets: 134 10*3/uL — ABNORMAL LOW (ref 150.0–400.0)
RBC: 4.66 Mil/uL (ref 4.22–5.81)
RDW: 13.9 % (ref 11.5–15.5)
WBC: 4.6 10*3/uL (ref 4.0–10.5)

## 2021-01-07 LAB — SEDIMENTATION RATE: Sed Rate: 1 mm/hr (ref 0–20)

## 2021-01-07 MED ORDER — AMOXICILLIN 500 MG PO TABS
1000.0000 mg | ORAL_TABLET | Freq: Two times a day (BID) | ORAL | 0 refills | Status: DC
Start: 2021-01-07 — End: 2021-01-12

## 2021-01-07 MED ORDER — HYDROCODONE-ACETAMINOPHEN 5-325 MG PO TABS
1.0000 | ORAL_TABLET | ORAL | 0 refills | Status: DC | PRN
Start: 2021-01-07 — End: 2021-02-18

## 2021-01-07 NOTE — Telephone Encounter (Signed)
I am not sure about medication that is that old--though don't think it would be harmful.  He needs the appt since may need antibiotics pending visit with dentist.

## 2021-01-07 NOTE — Progress Notes (Signed)
Subjective:    Patient ID: Joseph [name], male    DOB: 1955-09-22, 65 y.o.   MRN: [id number]  HPI Here due to tooth pain--with wife This visit occurred during the SARS-CoV-2 public health emergency.  Safety protocols were in place, including screening questions prior to the visit, additional usage of staff PPE, and extensive cleaning of exam room while observing appropriate contact time as indicated for disinfecting solutions.   Started with problems all summer Left tooth pain, left temple, left eye---and in ear Worse in the past week  No vision loss Now has jaw claudication--chews on right side Has sensitivity to cold and heat at tooth site  Has used ibuprofen with some relief  Current Outpatient Medications on File Prior to Visit  Medication Sig Dispense Refill   ALPRAZolam (XANAX) 0.5 MG tablet TAKE 1/2 TABLET BY MOUTH EVERY DAY AT BEDTIME 30 tablet 0   aspirin 81 MG EC tablet Take 81 mg by mouth daily.      atorvastatin (LIPITOR) 20 MG tablet TAKE 1 TABLET BY MOUTH EVERY DAY 90 tablet 3   cyclobenzaprine (FLEXERIL) 10 MG tablet Take 0.5-1 tablets (5-10 mg total) by mouth 3 (three) times daily as needed for muscle spasms. 30 tablet 1   glucose blood (ONETOUCH VERIO) test strip Use to check blood sugar once a day. Dx Code E11.9 100 each 12   ibuprofen (ADVIL,MOTRIN) 200 MG tablet Take 400 mg by mouth 2 (two) times daily as needed (pain).      Lancet Devices (ONE TOUCH DELICA LANCING DEV) MISC Use to obtain blood sample for glucose Dx Code E11.9 1 each 0   losartan (COZAAR) 50 MG tablet TAKE 1 TABLET BY MOUTH EVERY DAY 90 tablet 3   metFORMIN (GLUCOPHAGE-XR) 750 MG 24 hr tablet TAKE 1 TABLET (750 MG TOTAL) BY MOUTH DAILY WITH BREAKFAST. 90 tablet 3   metoprolol succinate (TOPROL-XL) 50 MG 24 hr tablet TAKE 1 TABLET (50 MG TOTAL) BY MOUTH DAILY. TAKE WITH OR IMMEDIATELY FOLLOWING A MEAL. 90 tablet 3   nitroGLYCERIN (NITROSTAT) 0.4 MG SL tablet Place 1 tablet (0.4 mg total) under the  tongue every 5 (five) minutes as needed for chest pain. 25 tablet 6   Omega-3 Fatty Acids (FISH OIL) 1000 MG CAPS Take 1,000 mg by mouth daily.     omeprazole (PRILOSEC) 40 MG capsule TAKE 1 CAPSULE (40 MG TOTAL) BY MOUTH DAILY. 90 capsule 3   OneTouch Delica Lancets 15Q MISC 1 each by Does not apply route daily. Use to obtain sample for glucose level Dx Code E11.9 100 each 3   traZODone (DESYREL) 100 MG tablet TAKE 1.5 TABLETS (150 MG TOTAL) BY MOUTH AT BEDTIME AS NEEDED FOR SLEEP. 135 tablet 3   [DISCONTINUED] pravastatin (PRAVACHOL) 40 MG tablet TAKE 1 TABLET BY MOUTH DAILY. 90 tablet 3   No current facility-administered medications on file prior to visit.    Allergies  Allergen Reactions   Zolpidem Other (See Comments)    hallucinate   Cephalexin Hives   Keflin [Cephalothin]     rash   Latex Dermatitis    Bandages and adhesives/ poss to latex   Lisinopril Cough    REACTION: cough   Zolpidem Tartrate     UNKNOWN   Lamisil [Terbinafine] Rash    Rash/itchy    Past Medical History:  Diagnosis Date   Allergy    Anxiety    Arthritis    knees and hip   CAD (coronary artery disease)  s/p emergent 4 V CABG (4/10) w 2/4 patent grafts by cath 08/11/09, patent LIMA to LAD & SVG to OM w moderate disease native RCA & occluded grafts to RCA and diagonal   Cataract    Chest pain    secondary to sternal non-union post CABg-now s/p sternotomy w repair 03/28/09 at [facility], dr. 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 Repeat at  [facility] w reported ef of 40%.    Pneumonia 2016   in past    Restless leg syndrome    Schatzki's ring    Type 2 diabetes mellitus, controlled ([place]) 6/15   type 2    Past Surgical History:  Procedure Laterality Date   CATARACT EXTRACTION W/PHACO Right 08/18/2016   Procedure: CATARACT EXTRACTION PHACO AND INTRAOCULAR LENS PLACEMENT ([place]) Right diabetic;  Surgeon: Leandrew Koyanagi, MD;  Location: [place];  Service: Ophthalmology;  Laterality: Right;  Diabetic-oral meds poss Latex allergy   CATARACT EXTRACTION W/PHACO Left 09/15/2016   Procedure: CATARACT EXTRACTION PHACO AND INTRAOCULAR LENS PLACEMENT ([place])  Left diabetic;  Surgeon: Leandrew Koyanagi, MD;  Location: [place];  Service: Ophthalmology;  Laterality: Left;  Diabetic - oral meds   COLON SURGERY  2014   for perforated colon- diverticulitis   COLONOSCOPY  2003,2010   2010 colon normal- 2003 HPP x 1    CORONARY ANGIOPLASTY  2003   stentx2   CORONARY ARTERY BYPASS GRAFT  08/16/2008   x4   ESOPHAGEAL DILATION  ~2009   Dr Earlean Shawl   ILEOSTOMY  2014   with reversal-- after diverticulitis. Sigmoid colectomy also. Dr Ely/Bird   INGUINAL HERNIA REPAIR Left x2   Dr Ernst Spell   LEFT HEART CATH AND CORS/GRAFTS ANGIOGRAPHY N/A 09/27/2019   Procedure: LEFT HEART CATH AND CORS/GRAFTS ANGIOGRAPHY;  Surgeon: Burnell Blanks, MD;  Location: [place] [facility];  Service: Cardiovascular;  Laterality: N/A;   PILONIDAL CYST EXCISION     pilonidal sinus tract excision     POLYPECTOMY  2007   HPP x 1    STERNUM SEPARATION REPAIR  03/2009   [facility]   TONSILLECTOMY  1984   UPPER GASTROINTESTINAL ENDOSCOPY     VASECTOMY      Family History  Problem Relation Age of Onset   Heart failure Mother    Diabetes Mother    Heart disease Mother    Congestive Heart Failure Mother        cause of death   Cancer Mother        ovarian?   Heart attack Father    Heart attack Brother    Diabetes Brother    Heart attack  Brother    Diabetes Brother    Bladder Cancer Brother    Heart attack Sister    Diabetes Sister    Cancer Maternal Aunt        died of breast cancer   Breast cancer Maternal Aunt    Cancer Maternal Grandmother  died of bone cancer   Bone cancer Maternal Grandmother    Colon cancer Neg Hx    Colon polyps Neg Hx    Esophageal cancer Neg Hx    Rectal cancer Neg Hx    Stomach cancer Neg Hx     Social History   Socioeconomic History   Marital status: Married    Spouse name: Not on file   Number of children: 3   Years of education: Not on file   Highest education level: Not on file  Occupational History   Occupation: Animator    Comment: Disabled   Occupation: Florist delivery    Comment: part time  Tobacco Use   Smoking status: Former    Packs/day: 0.50    Years: 37.00    Pack years: 18.50    Types: Cigarettes    Quit date: 07/18/2008    Years since quitting: 12.4   Smokeless tobacco: Never   Tobacco comments:    No smoking since bypass in 4/10  Vaping Use   Vaping Use: Never used  Substance and Sexual Activity   Alcohol use: Yes    Alcohol/week: 0.0 standard drinks    Comment: rarely   Drug use: No   Sexual activity: Not on file  Other Topics Concern   Not on file  Social History Narrative   Has living will   Wife is health care POA---alternate is daughter Ria Comment   Would accept resuscitation   Not sure about tube feeds--probably not prolonged   Social Determinants of Health   Financial Resource Strain: Not on file  Food Insecurity: Not on file  Transportation Needs: Not on file  Physical Activity: Not on file  Stress: Not on file  Social Connections: Not on file  Intimate Partner Violence: Not on file   Review of Systems No fever Some headaches---left temple     Objective:   Physical Exam Constitutional:      Appearance: Normal appearance.  HENT:     Head:     Comments: No temporal bruit    Left Ear: Tympanic  membrane and ear canal normal.     Mouth/Throat:     Comments: ?carious left lower premolar Some swelling behind last molar No clear infection in gumline Musculoskeletal:     Cervical back: Neck supple.  Lymphadenopathy:     Cervical: No cervical adenopathy.  Neurological:     Mental Status: He is alert.           Assessment & Plan:

## 2021-01-07 NOTE — Assessment & Plan Note (Signed)
Mostly in teeth and temple Seems most likely to be a dental infection---but unclear if could be temporal arteritis (very unlikely) or atypical facial pain  Going to the dentist tomorrow Will give amoxil  Few hydrocodone Check sed rate

## 2021-01-08 ENCOUNTER — Telehealth: Payer: Self-pay | Admitting: Internal Medicine

## 2021-01-08 ENCOUNTER — Ambulatory Visit: Payer: Medicare Other | Admitting: Dermatology

## 2021-01-08 NOTE — Telephone Encounter (Signed)
Pt wife called stating that pt is having a root canal 9/28 and is requesting a call back from Dr Silvio Pate CMA. She has some concerns.

## 2021-01-08 NOTE — Telephone Encounter (Signed)
error 

## 2021-01-09 NOTE — Telephone Encounter (Signed)
Called spoke to patient. They were concerned with the platelets on his last lab work. Wanted to make sure you did not feel it would be a problem with getting a root canal on Wednesday. Would like to have call back before procedure to make sure you are ok with it and do not feel he needs to have any further labs to make sure not change that could cause issue with bleeding with procedure. Patient and wife informed that you are out of the office until next week. Fine with holding off until your return for response.

## 2021-01-12 MED ORDER — AMOXICILLIN 500 MG PO TABS: 1000.0000 mg | ORAL_TABLET | Freq: Two times a day (BID) | ORAL | 0 refills | Status: AC

## 2021-01-12 MED ORDER — AMOXICILLIN 500 MG PO TABS: 1000.0000 mg | ORAL_TABLET | Freq: Three times a day (TID) | ORAL | 0 refills | Status: DC

## 2021-01-12 NOTE — Addendum Note (Signed)
Addended by: Pilar Grammes on: 01/12/2021 04:33 PM   Modules accepted: Orders

## 2021-01-12 NOTE — Telephone Encounter (Signed)
Pt wife called in requesting a call back regarding medication concern # [phone]

## 2021-01-12 NOTE — Telephone Encounter (Signed)
Spoke to pt. Having a root canal 9-28. He has enough amoxicillin to go through to tomorrow evening. Asking if he needs more for Wednesday prior to the procedure and after.

## 2021-01-12 NOTE — Telephone Encounter (Signed)
Spoke to pt. Rx was sent in wrong. I have corrected it.

## 2021-01-12 NOTE — Addendum Note (Signed)
Addended by: Pilar Grammes on: 01/12/2021 02:10 PM   Modules accepted: Orders

## 2021-01-12 NOTE — Telephone Encounter (Signed)
Spoke to pt. [facility]

## 2021-01-22 DIAGNOSIS — E119 Type 2 diabetes mellitus without complications: Secondary | ICD-10-CM | POA: Diagnosis not present

## 2021-01-22 LAB — HM DIABETES EYE EXAM

## 2021-01-23 ENCOUNTER — Telehealth: Payer: Self-pay | Admitting: Internal Medicine

## 2021-01-23 NOTE — Telephone Encounter (Signed)
Spoke to pt

## 2021-01-23 NOTE — Telephone Encounter (Signed)
Pt called stating that he took his last Amoxicillin on 01/18/21, and was asking if it was ok for him to get his flu shot and covid booster. Pt states that he has an appt for his covid booster on 01/25/21.

## 2021-01-25 DIAGNOSIS — Z23 Encounter for immunization: Secondary | ICD-10-CM | POA: Diagnosis not present

## 2021-02-01 ENCOUNTER — Other Ambulatory Visit: Payer: Self-pay | Admitting: Internal Medicine

## 2021-02-02 NOTE — Telephone Encounter (Signed)
Last filled 12-04-20 #30 Last OV 01-07-21 Next OV 03-20-21 [facility] [address] [address]

## 2021-02-04 DIAGNOSIS — Z23 Encounter for immunization: Secondary | ICD-10-CM | POA: Diagnosis not present

## 2021-02-05 ENCOUNTER — Telehealth: Payer: Self-pay | Admitting: Internal Medicine

## 2021-02-05 NOTE — Telephone Encounter (Signed)
Updated chart.

## 2021-02-05 NOTE — Telephone Encounter (Signed)
Pt called in to report covid booster update 10/9 and flu shot 10/19

## 2021-02-06 ENCOUNTER — Encounter: Payer: Self-pay | Admitting: Internal Medicine

## 2021-02-18 ENCOUNTER — Other Ambulatory Visit: Payer: Self-pay

## 2021-02-18 ENCOUNTER — Ambulatory Visit (INDEPENDENT_AMBULATORY_CARE_PROVIDER_SITE_OTHER): Payer: Medicare Other | Admitting: Podiatry

## 2021-02-18 ENCOUNTER — Encounter: Payer: Self-pay | Admitting: Podiatry

## 2021-02-18 DIAGNOSIS — M722 Plantar fascial fibromatosis: Secondary | ICD-10-CM | POA: Diagnosis not present

## 2021-02-18 DIAGNOSIS — I25810 Atherosclerosis of coronary artery bypass graft(s) without angina pectoris: Secondary | ICD-10-CM | POA: Diagnosis not present

## 2021-02-18 DIAGNOSIS — E119 Type 2 diabetes mellitus without complications: Secondary | ICD-10-CM

## 2021-02-18 DIAGNOSIS — B351 Tinea unguium: Secondary | ICD-10-CM

## 2021-02-18 NOTE — Progress Notes (Signed)
Subjective:   Patient ID: Joseph [name], male   DOB: 65 y.o.   MRN: [id number]   HPI Patient presents stating his heel has started to hurt him recently and he does have diabetes and needs to have his feet checked in general.  Patient does not currently smoke has had heart issues in the past   Review of Systems  All other systems reviewed and are negative.      Objective:  Physical Exam Vitals and nursing note reviewed.  Constitutional:      Appearance: He is well-developed.  Pulmonary:     Effort: Pulmonary effort is normal.  Musculoskeletal:        General: Normal range of motion.  Skin:    General: Skin is warm.  Neurological:     Mental Status: He is alert.    Neurovascular status is found to be intact with no loss of vibratory sharp dull but I did note moderate skipping of beat and I did explain that to him and when he sees his physician next he will check but states he has not noted any changes in his health.  He does have mild discomfort heel but it is improved from where it had been and no other pathology was noted with good digital perfusion well oriented     Assessment:  Long-term diabetic history of heart issues which appears to be stable but does have slight irregular heart rate with mild Planter fasciitis left     Plan:  H&P reviewed all conditions recommended anti-inflammatories as needed support therapy and daily inspections of feet but at this point appears stable

## 2021-03-18 ENCOUNTER — Ambulatory Visit (INDEPENDENT_AMBULATORY_CARE_PROVIDER_SITE_OTHER): Payer: Medicare Other | Admitting: Dermatology

## 2021-03-18 ENCOUNTER — Other Ambulatory Visit: Payer: Self-pay

## 2021-03-18 DIAGNOSIS — L821 Other seborrheic keratosis: Secondary | ICD-10-CM

## 2021-03-18 DIAGNOSIS — B079 Viral wart, unspecified: Secondary | ICD-10-CM | POA: Diagnosis not present

## 2021-03-18 DIAGNOSIS — L719 Rosacea, unspecified: Secondary | ICD-10-CM

## 2021-03-18 DIAGNOSIS — D1800 Hemangioma unspecified site: Secondary | ICD-10-CM

## 2021-03-18 DIAGNOSIS — I25810 Atherosclerosis of coronary artery bypass graft(s) without angina pectoris: Secondary | ICD-10-CM | POA: Diagnosis not present

## 2021-03-18 DIAGNOSIS — L820 Inflamed seborrheic keratosis: Secondary | ICD-10-CM | POA: Diagnosis not present

## 2021-03-18 NOTE — Patient Instructions (Addendum)
Instructions for [facility] Medications  One or more of your medications was sent to the [facility] mail order compounding pharmacy. You will receive an email from them and can purchase the medicine through that link. It will then be mailed to your home at the address you confirmed. If for any reason you do not receive an email from them, please check your spam folder. If you still do not find the email, please let us know. [facility] phone number is [phone].    If You Need Anything After Your Visit  If you have any questions or concerns for your doctor, please call our main line at [phone] and press option 4 to reach your doctor's medical assistant. If no one answers, please leave a voicemail as directed and we will return your call as soon as possible. Messages left after 4 pm will be answered the following business day.   You may also send [place] a message via [place]. We typically respond to MyChart messages within 1-2 business days.  For prescription refills, please ask your pharmacy to contact our office. Our fax number is [phone].  If you have an urgent issue when the clinic is closed that cannot wait until the next business day, you can page your doctor at the number below.    Please note that while we do our best to be available for urgent issues outside of office hours, we are not available 24/7.   If you have an urgent issue and are unable to reach [place], you may choose to seek medical care at your doctor's office, retail clinic, urgent care center, or emergency room.  If you have a medical emergency, please immediately call 911 or go to the emergency department.  Pager Numbers  - Dr. Nehemiah Massed: [phone]  - Dr. Laurence Ferrari: [phone]  - Dr. Nicole Kindred: [phone]  In the event of inclement weather, please call our main line at [phone] for an update on the status of any delays or closures.  Dermatology Medication Tips: Please keep the boxes that  topical medications come in in order to help keep track of the instructions about where and how to use these. Pharmacies typically print the medication instructions only on the boxes and not directly on the medication tubes.   If your medication is too expensive, please contact our office at [phone] option 4 or send [place] a message through [place].   We are unable to tell what your co-pay for medications will be in advance as this is different depending on your insurance coverage. However, we may be able to find a substitute medication at lower cost or fill out paperwork to get insurance to cover a needed medication.   If a prior authorization is required to get your medication covered by your insurance company, please allow [place] 1-2 business days to complete this process.  Drug prices often vary depending on where the prescription is filled and some pharmacies may offer cheaper prices.  The website www.goodrx.com contains coupons for medications through different pharmacies. The prices here do not account for what the cost may be with help from insurance (it may be cheaper with your insurance), but the website can give you the price if you did not use any insurance.  - You can print the associated coupon and take it with your prescription to the pharmacy.  - You may also stop by our office during regular business hours and pick up a GoodRx coupon card.  - If you need your prescription  sent electronically to a different pharmacy, notify our office through [facility] or by phone at [phone] option 4.     Si Usted Necesita Algo Despus de Su Visita  Tambin puede enviarnos un mensaje a travs de Pharmacist, community. Por lo general respondemos a los mensajes de MyChart en el transcurso de 1 a 2 das hbiles.  Para renovar recetas, por favor pida a su farmacia que se ponga en contacto con nuestra oficina. Harland Dingwall de fax es [place] [phone].  Si tiene un asunto urgente cuando la clnica  est cerrada y que no puede esperar hasta el siguiente da hbil, puede llamar/localizar a su doctor(a) al nmero que aparece a continuacin.   Por favor, tenga en cuenta que aunque hacemos todo lo posible para estar disponibles para asuntos urgentes fuera del horario de [place], no estamos disponibles las 24 horas del da, los 7 das de la [place].   Si tiene un problema urgente y no puede comunicarse con nosotros, puede optar por buscar atencin mdica  en el consultorio de su doctor(a), en una clnica privada, en un centro de atencin urgente o en una sala de emergencias.  Si tiene Engineering geologist, por favor llame inmediatamente al 911 o vaya a la sala de emergencias.  Nmeros de bper  - Dr. Nehemiah Massed: [phone]  - Dra. Moye: [phone]  - Dra. Nicole Kindred: [phone]  En caso de inclemencias del [place], por favor llame a Johnsie Kindred principal al [phone] para una actualizacin sobre el [place] de cualquier retraso o cierre.  Consejos para la medicacin en dermatologa: Por favor, guarde las cajas en las que vienen los medicamentos de uso tpico para ayudarle a seguir las instrucciones sobre dnde y cmo usarlos. Las farmacias generalmente imprimen las instrucciones del medicamento slo en las cajas y no directamente en los tubos del [place].   Si su medicamento es muy caro, por favor, pngase en contacto con Zigmund Daniel llamando al [phone] y presione la opcin 4 o envenos un mensaje a travs de Pharmacist, community.   No podemos decirle cul ser su copago por los medicamentos por adelantado ya que esto es diferente dependiendo de la cobertura de su seguro. Sin embargo, es posible que podamos encontrar un medicamento sustituto a Electrical engineer un formulario para que el seguro cubra el medicamento que se considera necesario.   Si se requiere una autorizacin previa para que su compaa de seguros [place] su medicamento, por favor permtanos de 1 a 2 das hbiles para  completar este proceso.  Los precios de los medicamentos varan con frecuencia dependiendo del Environmental consultant de dnde se surte la receta y alguna farmacias pueden ofrecer precios ms baratos.  El sitio web www.goodrx.com tiene cupones para medicamentos de Airline pilot. Los precios aqu no tienen en cuenta lo que podra costar con la ayuda del seguro (puede ser ms barato con su seguro), pero el sitio web puede darle el precio si no utiliz Research scientist (physical sciences).  - Puede imprimir el cupn correspondiente y llevarlo con su receta a la farmacia.  - Tambin puede pasar por nuestra oficina durante el horario de atencin regular y Charity fundraiser una tarjeta de cupones de GoodRx.  - Si necesita que su receta se enve electrnicamente a una farmacia diferente, informe a nuestra oficina a travs de MyChart de [place] o por telfono llamando al [phone] y presione la opcin 4.

## 2021-03-18 NOTE — Progress Notes (Signed)
New Patient Visit  Subjective  Joseph [name] is a 65 y.o. male who presents for the following: irregular skin lesions (On the right forearm and R palm - patient is concerned and would like them checked today.). Hx rosacea - used topical treatment in the past which helped.  The patient has spots, moles and lesions to be evaluated, some may be new or changing and the patient has concerns that these could be cancer.  The following portions of the chart were reviewed this encounter and updated as appropriate:   Tobacco  Allergies  Meds  Problems  Med Hx  Surg Hx  Fam Hx     Review of Systems:  No other skin or systemic complaints except as noted in HPI or Assessment and Plan.  Objective  Well appearing patient in no apparent distress; mood and affect are within normal limits.  A focused examination was performed including the face, scalp, arms, and hands. Relevant physical exam findings are noted in the Assessment and Plan.  R forearm x 1 Erythematous keratotic or waxy stuck-on papule or plaque.   R palm x 1 Verrucous papules -- Discussed viral etiology and contagion.   Face Mid face erythema with telangiectasias +/- scattered inflammatory papules.    Assessment & Plan  Inflamed seborrheic keratosis R forearm x 1  New and growing   Destruction of lesion - R forearm x 1 Complexity: simple   Destruction method: cryotherapy   Informed consent: discussed and consent obtained   Timeout:  patient name, date of birth, surgical site, and procedure verified Lesion destroyed using liquid nitrogen: Yes   Region frozen until ice ball extended beyond lesion: Yes   Outcome: patient tolerated procedure well with no complications   Post-procedure details: wound care instructions given    Viral warts, unspecified type R palm x 1  Discussed viral etiology and risk of spread.  Discussed multiple treatments may be required to clear warts.  Discussed possible post-treatment  dyspigmentation and risk of recurrence.  Destruction of lesion - R palm x 1 Complexity: simple   Destruction method: cryotherapy   Informed consent: discussed and consent obtained   Timeout:  patient name, date of birth, surgical site, and procedure verified Lesion destroyed using liquid nitrogen: Yes   Region frozen until ice ball extended beyond lesion: Yes   Outcome: patient tolerated procedure well with no complications   Post-procedure details: wound care instructions given    Rosacea Face  Rosacea is a chronic progressive skin condition usually affecting the face of adults, causing redness and/or acne bumps. It is treatable but not curable. It sometimes affects the eyes (ocular rosacea) as well. It may respond to topical and/or systemic medication and can flare with stress, sun exposure, alcohol, exercise and some foods.  Daily application of broad spectrum spf 30+ sunscreen to face is recommended to reduce flares.  Start [facility] mix QHS.   Discussed the treatment option of BBL/laser.  Typically we recommend 1-3 treatment sessions about 5-8 weeks apart for best results.  The patient's condition may require "maintenance treatments" in the future.  The fee for BBL / laser treatments is $350 per treatment session for the whole face.  A fee can be quoted for other parts of the body. Insurance typically does not pay for BBL/laser treatments and therefore the fee is an out-of-pocket cost.   Seborrheic Keratoses - Stuck-on, waxy, tan-brown papules and/or plaques  - Benign-appearing - Discussed benign etiology and prognosis. - Observe -  Call for any changes  Hemangiomas - Red papules - Discussed benign nature - Observe - Call for any changes  Return if symptoms worsen or fail to improve.  Luther Redo, CMA, am acting as scribe for Sarina Ser, MD .  Documentation: I have reviewed the above documentation for accuracy and completeness, and I agree with the  above.  Sarina Ser, MD

## 2021-03-20 ENCOUNTER — Encounter: Payer: Self-pay | Admitting: Internal Medicine

## 2021-03-20 ENCOUNTER — Other Ambulatory Visit: Payer: Self-pay

## 2021-03-20 ENCOUNTER — Ambulatory Visit (INDEPENDENT_AMBULATORY_CARE_PROVIDER_SITE_OTHER): Payer: Medicare Other | Admitting: Internal Medicine

## 2021-03-20 VITALS — BP 112/78 | HR 74 | Temp 97.2°F | Ht 69.0 in | Wt 163.0 lb

## 2021-03-20 DIAGNOSIS — Z0000 Encounter for general adult medical examination without abnormal findings: Secondary | ICD-10-CM | POA: Diagnosis not present

## 2021-03-20 DIAGNOSIS — D696 Thrombocytopenia, unspecified: Secondary | ICD-10-CM | POA: Diagnosis not present

## 2021-03-20 DIAGNOSIS — I25118 Atherosclerotic heart disease of native coronary artery with other forms of angina pectoris: Secondary | ICD-10-CM | POA: Diagnosis not present

## 2021-03-20 DIAGNOSIS — E1159 Type 2 diabetes mellitus with other circulatory complications: Secondary | ICD-10-CM | POA: Diagnosis not present

## 2021-03-20 DIAGNOSIS — I25810 Atherosclerosis of coronary artery bypass graft(s) without angina pectoris: Secondary | ICD-10-CM | POA: Diagnosis not present

## 2021-03-20 DIAGNOSIS — F39 Unspecified mood [affective] disorder: Secondary | ICD-10-CM

## 2021-03-20 LAB — CBC
HCT: 44.4 % (ref 39.0–52.0)
Hemoglobin: 15.4 g/dL (ref 13.0–17.0)
MCHC: 34.6 g/dL (ref 30.0–36.0)
MCV: 92.2 fl (ref 78.0–100.0)
Platelets: 135 10*3/uL — ABNORMAL LOW (ref 150.0–400.0)
RBC: 4.82 Mil/uL (ref 4.22–5.81)
RDW: 13.4 % (ref 11.5–15.5)
WBC: 5.2 10*3/uL (ref 4.0–10.5)

## 2021-03-20 LAB — COMPREHENSIVE METABOLIC PANEL
ALT: 51 U/L (ref 0–53)
AST: 26 U/L (ref 0–37)
Albumin: 4.6 g/dL (ref 3.5–5.2)
Alkaline Phosphatase: 52 U/L (ref 39–117)
BUN: 20 mg/dL (ref 6–23)
CO2: 26 mEq/L (ref 19–32)
Calcium: 9.5 mg/dL (ref 8.4–10.5)
Chloride: 102 mEq/L (ref 96–112)
Creatinine, Ser: 1.14 mg/dL (ref 0.40–1.50)
GFR: 66.58 mL/min (ref >60.00)
Glucose, Bld: 130 mg/dL — ABNORMAL HIGH (ref 70–99)
Potassium: 4.2 mEq/L (ref 3.5–5.1)
Sodium: 135 mEq/L (ref 135–145)
Total Bilirubin: 0.6 mg/dL (ref 0.2–1.2)
Total Protein: 7.3 g/dL (ref 6.0–8.3)

## 2021-03-20 LAB — LIPID PANEL
Cholesterol: 104 mg/dL (ref 0–200)
HDL: 28.5 mg/dL — ABNORMAL LOW (ref >39.00)
LDL Cholesterol: 43 mg/dL (ref 0–99)
NonHDL: 75.84
Total CHOL/HDL Ratio: 4
Triglycerides: 163 mg/dL — ABNORMAL HIGH (ref 0.0–149.0)
VLDL: 32.6 mg/dL (ref 0.0–40.0)

## 2021-03-20 LAB — POCT GLYCOSYLATED HEMOGLOBIN (HGB A1C): Hemoglobin A1C: 5.9 % — AB (ref 4.0–5.6)

## 2021-03-20 LAB — HM DIABETES FOOT EXAM

## 2021-03-20 NOTE — Progress Notes (Signed)
Subjective:    Patient ID: Joseph [name], male    DOB: 01/24/1956, 65 y.o.   MRN: [id number]  HPI Here for Medicare wellness visit and follow up of chronic health conditions This visit occurred during the SARS-CoV-2 public health emergency.  Safety protocols were in place, including screening questions prior to the visit, additional usage of staff PPE, and extensive cleaning of exam room while observing appropriate contact time as indicated for disinfecting solutions.   Reviewed advanced directives Reviewed other doctors---Dr Sivils---oral surgeon, Dr McAlhany--cardiology, Dr Regal--podiatrist, Dr Maryellen Pile, Dr Archie Balboa No hospitalizations or surgeon in past year Vision and hearing are fine Will have a weekly glass of wine No tobacco Intermittent exercise--and stays active No falls No depression or anhedonia Independent with instrumental ADLs No memory problems  Checks sugars daily 130-140 range fasting Some foot pain---saw Dr Paulla Dolly. No burning or apparent neuropathy  No chest pain No SOB---but does have some increased DOE (like on stairs) No dizziness ro syncope Occasional headaches--ibuprofen helps Podiatrist noted skip in heart--no palpitations No edema  Does use the xanax---1/2 at bedtime Also uses trazodone at bedtime  Last platelet count 134K Does bruise easily--but no abnormal bleeding (is on aspirin)  Current Outpatient Medications on File Prior to Visit  Medication Sig Dispense Refill   ALPRAZolam (XANAX) 0.5 MG tablet TAKE 1/2 TABLET BY MOUTH EVERY DAY AT BEDTIME 30 tablet 0   aspirin 81 MG EC tablet Take 81 mg by mouth daily.      atorvastatin (LIPITOR) 20 MG tablet TAKE 1 TABLET BY MOUTH EVERY DAY 90 tablet 3   cyclobenzaprine (FLEXERIL) 10 MG tablet Take 0.5-1 tablets (5-10 mg total) by mouth 3 (three) times daily as needed for muscle spasms. 30 tablet 1   glucose blood (ONETOUCH VERIO) test strip Use to check blood sugar once a day. Dx Code  E11.9 100 each 12   ibuprofen (ADVIL,MOTRIN) 200 MG tablet Take 400 mg by mouth 2 (two) times daily as needed (pain).      Lancet Devices (ONE TOUCH DELICA LANCING DEV) MISC Use to obtain blood sample for glucose Dx Code E11.9 1 each 0   losartan (COZAAR) 50 MG tablet TAKE 1 TABLET BY MOUTH EVERY DAY 90 tablet 3   metFORMIN (GLUCOPHAGE-XR) 750 MG 24 hr tablet TAKE 1 TABLET (750 MG TOTAL) BY MOUTH DAILY WITH BREAKFAST. 90 tablet 3   metoprolol succinate (TOPROL-XL) 50 MG 24 hr tablet TAKE 1 TABLET (50 MG TOTAL) BY MOUTH DAILY. TAKE WITH OR IMMEDIATELY FOLLOWING A MEAL. 90 tablet 3   nitroGLYCERIN (NITROSTAT) 0.4 MG SL tablet Place 1 tablet (0.4 mg total) under the tongue every 5 (five) minutes as needed for chest pain. 25 tablet 6   Omega-3 Fatty Acids (FISH OIL) 1000 MG CAPS Take 1,000 mg by mouth daily.     omeprazole (PRILOSEC) 40 MG capsule TAKE 1 CAPSULE (40 MG TOTAL) BY MOUTH DAILY. 90 capsule 3   OneTouch Delica Lancets 90Z MISC 1 each by Does not apply route daily. Use to obtain sample for glucose level Dx Code E11.9 100 each 3   traZODone (DESYREL) 100 MG tablet TAKE 1.5 TABLETS (150 MG TOTAL) BY MOUTH AT BEDTIME AS NEEDED FOR SLEEP. 135 tablet 3   [DISCONTINUED] pravastatin (PRAVACHOL) 40 MG tablet TAKE 1 TABLET BY MOUTH DAILY. 90 tablet 3   No current facility-administered medications on file prior to visit.    Allergies  Allergen Reactions   Zolpidem Other (See Comments)    hallucinate  Cephalexin Hives   Keflin [Cephalothin]     rash   Latex Dermatitis    Bandages and adhesives/ poss to latex   Lisinopril Cough    REACTION: cough   Zolpidem Tartrate     UNKNOWN   Lamisil [Terbinafine] Rash    Rash/itchy    Past Medical History:  Diagnosis Date   Allergy    Anxiety    Arthritis    knees and hip   CAD (coronary artery disease)    s/p emergent 4 V CABG (4/10) w 2/4 patent grafts by cath 08/11/09, patent LIMA to LAD & SVG to OM w moderate disease native RCA & occluded  grafts to RCA and diagonal   Cataract    Chest pain    secondary to sternal non-union post CABg-now s/p sternotomy w repair 03/28/09 at [facility], dr. 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) [id number]   Nephrolithiasis    Normal echocardiogram 5/10   EF 60, apical & apicallateral HK, PASP 29mmHg. Repeat at [facility] w reported ef of 40%.    Pneumonia 2016   in past    Restless leg syndrome    Schatzki's ring    Type 2 diabetes mellitus, controlled ([place]) 6/15   type 2    Past Surgical History:  Procedure Laterality Date   CATARACT EXTRACTION W/PHACO Right 08/18/2016   Procedure: CATARACT EXTRACTION PHACO AND INTRAOCULAR LENS PLACEMENT ([place]) Right diabetic;  Surgeon: Leandrew Koyanagi, MD;  Location: [place];  Service: Ophthalmology;  Laterality: Right;  Diabetic-oral meds poss Latex allergy   CATARACT EXTRACTION W/PHACO Left 09/15/2016   Procedure: CATARACT EXTRACTION PHACO AND INTRAOCULAR LENS PLACEMENT ([place])  Left diabetic;  Surgeon: Leandrew Koyanagi, MD;  Location: [place];  Service: Ophthalmology;  Laterality: Left;  Diabetic - oral meds   COLON SURGERY  2014   for perforated colon- diverticulitis   COLONOSCOPY  2003,2010   2010 colon normal- 2003 HPP x 1    CORONARY ANGIOPLASTY  2003   stentx2   CORONARY ARTERY BYPASS GRAFT  08/16/2008   x4   ESOPHAGEAL DILATION  ~2009   Dr Earlean Shawl   ILEOSTOMY  2014   with reversal-- after diverticulitis. Sigmoid colectomy also. Dr Ely/Bird   INGUINAL HERNIA REPAIR Left x2   Dr Ernst Spell   LEFT HEART CATH AND CORS/GRAFTS ANGIOGRAPHY N/A 09/27/2019   Procedure: LEFT HEART  CATH AND CORS/GRAFTS ANGIOGRAPHY;  Surgeon: Burnell Blanks, MD;  Location: [place] [facility];  Service: Cardiovascular;  Laterality: N/A;   PILONIDAL CYST EXCISION     pilonidal sinus tract excision     POLYPECTOMY  2007   HPP x 1    STERNUM SEPARATION REPAIR  03/2009   [facility]   TONSILLECTOMY  1984   UPPER GASTROINTESTINAL ENDOSCOPY     VASECTOMY      Family History  Problem Relation Age of Onset   Heart failure Mother    Diabetes Mother    Heart disease Mother    Congestive Heart Failure Mother        cause of death  Cancer Mother        ovarian?   Heart attack Father    Heart attack Sister    Diabetes Sister    Heart attack Brother    Diabetes Brother    Cancer Brother        Bladder   Heart attack Brother    Diabetes Brother    Bladder Cancer Brother    Cancer Maternal Aunt        died of breast cancer   Breast cancer Maternal Aunt    Cancer Maternal Grandmother        died of bone cancer   Bone cancer Maternal Grandmother    Colon cancer Neg Hx    Colon polyps Neg Hx    Esophageal cancer Neg Hx    Rectal cancer Neg Hx    Stomach cancer Neg Hx     Social History   Socioeconomic History   Marital status: Married    Spouse name: Not on file   Number of children: 3   Years of education: Not on file   Highest education level: Not on file  Occupational History   Occupation: Animator    Comment: Disabled   Occupation: Florist delivery    Comment: part time  Tobacco Use   Smoking status: Former    Packs/day: 0.50    Years: 37.00    Pack years: 18.50    Types: Cigarettes    Quit date: 07/18/2008    Years since quitting: 12.6   Smokeless tobacco: Never   Tobacco comments:    No smoking since bypass in 4/10  Vaping Use   Vaping Use: Never used  Substance and Sexual Activity   Alcohol use: Yes    Alcohol/week: 0.0 standard drinks    Comment: rarely   Drug use: No   Sexual activity: Not on file  Other Topics Concern    Not on file  Social History Narrative   Has living will   Wife is health care POA---alternate is daughter Ria Comment   Would accept resuscitation   Not sure about tube feeds--probably not prolonged   Social Determinants of Health   Financial Resource Strain: Not on file  Food Insecurity: Not on file  Transportation Needs: Not on file  Physical Activity: Not on file  Stress: Not on file  Social Connections: Not on file  Intimate Partner Violence: Not on file   Review of Systems Appetite is good--tries to limit Weight stable Sleeps okay Wears seat belt No dental problems since root canal in summer Was just at dermatologist Takes omeprazole daily---will have heartburn if misses dose. No dysphagia Bowels moving fine Voids okay. No problems Some chronic low back pain but no other joint pains. Uses ibuprofen prn    Objective:   Physical Exam Constitutional:      Appearance: Normal appearance.  HENT:     Mouth/Throat:     Comments: No lesions Eyes:     Conjunctiva/sclera: Conjunctivae normal.     Pupils: Pupils are equal, round, and reactive to light.  Cardiovascular:     Rate and Rhythm: Normal rate and regular rhythm.     Pulses: Normal pulses.     Heart sounds: No murmur heard.   No gallop.  Pulmonary:     Effort: Pulmonary effort is normal.     Breath sounds: Normal breath sounds. No wheezing or rales.  Abdominal:     Palpations: Abdomen is soft.     Tenderness: There is no abdominal  tenderness.  Musculoskeletal:     Cervical back: Neck supple.  Lymphadenopathy:     Cervical: No cervical adenopathy.  Skin:    Findings: No lesion or rash.     Comments: No foot lesions  Neurological:     Mental Status: He is alert and oriented to person, place, and time.     Comments: President---"Joe Balinda Quails Obama" [phone] [id number] Recall 3/3  Normal sensation in feet  Psychiatric:        Mood and Affect: Mood normal.        Behavior:  Behavior normal.           Assessment & Plan:

## 2021-03-20 NOTE — Assessment & Plan Note (Signed)
Doing well Has DOE but seems to be stable On statin, losartan, ASA, metoprolol Never used nitro

## 2021-03-20 NOTE — Assessment & Plan Note (Signed)
I have personally reviewed the Medicare Annual Wellness questionnaire and have noted 1. The patient's medical and social history 2. Their use of alcohol, tobacco or illicit drugs 3. Their current medications and supplements 4. The patient's functional ability including ADL's, fall risks, home safety risks and hearing or visual             impairment. 5. Diet and physical activities 6. Evidence for depression or mood disorders  The patients weight, height, BMI and visual acuity have been recorded in the chart I have made referrals, counseling and provided education to the patient based review of the above and I have provided the pt with a written personalized care plan for preventive services.  I have provided you with a copy of your personalized plan for preventive services. Please take the time to review along with your updated medication list.  No more cancer screening--colon normal last year Defer PSA to next year Needs to increase exercise Had bivalent COVID and flu vaccines shingrix when covered

## 2021-03-20 NOTE — Assessment & Plan Note (Signed)
Lab Results  Component Value Date   HGBA1C 5.9 (A) 03/20/2021   Excellent control on just metformin

## 2021-03-20 NOTE — Assessment & Plan Note (Signed)
No depression Mild situational issues and anxiety Uses xanax at bedtime

## 2021-03-20 NOTE — Assessment & Plan Note (Signed)
Mild Easy bruising with asa

## 2021-03-26 ENCOUNTER — Encounter: Payer: Self-pay | Admitting: Dermatology

## 2021-04-01 ENCOUNTER — Other Ambulatory Visit: Payer: Self-pay | Admitting: Internal Medicine

## 2021-04-01 NOTE — Telephone Encounter (Signed)
Last filled 02-02-21 #30 Last OV 03-20-21 Next OV 09-18-21 [facility] [address] [address]

## 2021-04-02 ENCOUNTER — Other Ambulatory Visit: Payer: Self-pay | Admitting: Internal Medicine

## 2021-04-09 ENCOUNTER — Other Ambulatory Visit: Payer: Self-pay | Admitting: Internal Medicine

## 2021-05-21 ENCOUNTER — Other Ambulatory Visit: Payer: Self-pay | Admitting: Internal Medicine

## 2021-05-21 MED ORDER — TRAZODONE HCL 100 MG PO TABS: 150.0000 mg | ORAL_TABLET | Freq: Every evening | ORAL | 3 refills | Status: DC | PRN

## 2021-05-21 MED ORDER — ALPRAZOLAM 0.5 MG PO TABS: ORAL_TABLET | ORAL | 0 refills | Status: DC

## 2021-05-21 NOTE — Telephone Encounter (Signed)
Alprazolam Last filled 04-01-21 #30 Last OV 03-20-21 Next OV 09-18-21 Joseph [name]

## 2021-05-21 NOTE — Telephone Encounter (Signed)
°  Encourage patient to contact the pharmacy for refills or they can request refills through [place]:  Please schedule appointment if longer than 1 year  NEXT APPOINTMENT DATE:  MEDICATION:traZODone (DESYREL) 100 MG tablet,ALPRAZolam (XANAX) 0.5 MG tablet  Is the patient out of medication?   PHARMACY:[facility] [id number] [address], [place]  Let patient know to contact pharmacy at the end of the day to make sure medication is ready.  Please notify patient to allow 48-72 hours to process  CLINICAL FILLS OUT ALL BELOW:   LAST REFILL:  QTY:  REFILL DATE:    OTHER COMMENTS:    Okay for refill?  Please advise

## 2021-07-02 ENCOUNTER — Other Ambulatory Visit: Payer: Self-pay | Admitting: Internal Medicine

## 2021-07-08 ENCOUNTER — Telehealth: Payer: Self-pay

## 2021-07-08 MED ORDER — METFORMIN HCL ER 750 MG PO TB24: ORAL_TABLET | ORAL | 3 refills | Status: DC

## 2021-07-08 MED ORDER — ATORVASTATIN CALCIUM 20 MG PO TABS: 20.0000 mg | ORAL_TABLET | Freq: Every day | ORAL | 3 refills | Status: DC

## 2021-07-08 MED ORDER — OMEPRAZOLE 40 MG PO CPDR: 40.0000 mg | DELAYED_RELEASE_CAPSULE | Freq: Every day | ORAL | 3 refills | Status: DC

## 2021-07-08 MED ORDER — METOPROLOL SUCCINATE ER 50 MG PO TB24: 50.0000 mg | ORAL_TABLET | Freq: Every day | ORAL | 3 refills | Status: DC

## 2021-07-08 NOTE — Telephone Encounter (Signed)
Patient called office states that he changed to [facility]. His [facility]. He would like to have new script for for all listed medications sent to [facility] with 90 day supply.  ? ?Atorvastatin '20mg'$  ?Omeprazole '40mg'$   ?Metoprolol '50mg'$  ?Metformin xr '750mg'$  ?  ?And he would like to see if you would take over refills of the Losartan '50mg'$   ?

## 2021-07-08 NOTE — Addendum Note (Signed)
Addended by: Pilar Grammes on: 07/08/2021 04:18 PM ? ? Modules accepted: Orders ? ?

## 2021-07-08 NOTE — Telephone Encounter (Signed)
Rxs sent electronically. Forward to Dr Silvio Pate to see about taking over losartan rx. ? ?

## 2021-07-27 ENCOUNTER — Other Ambulatory Visit: Payer: Self-pay | Admitting: Internal Medicine

## 2021-07-27 NOTE — Telephone Encounter (Signed)
Last filled 05-21-21 #30 ?Last OV 03-20-21 ?Next OV 09-18-21 ?Joseph [name] ?

## 2021-09-11 ENCOUNTER — Other Ambulatory Visit: Payer: Self-pay | Admitting: Cardiovascular Disease

## 2021-09-15 NOTE — Progress Notes (Unsigned)
No chief complaint on file.  History of Present Illness: 66 yo male with history of CAD s/p 4V CABG, DM, GERD and nephrolithiasis who is here today for cardiac follow up. He was admitted to [facility] in April 2010 with an anterior STEMI. Emergent cath demonstrated a patent prior stent in the ostial LAD extending into the mid LAD. There was a hazy 99% stenosis in the mid LAD that was felt to be the culprit. PCI failed after multiple attempts, followed by ventricular fibrillation and the pt was taken emergently to the operating room for bypass. Emergent CABG was performed by Dr. Roxan Hockey with 4 vessels bypassed (LIMA to LAD, SVG to D2, SVG to OM2, SVG to distal RCA). After surgery, he had problems with chest wall pain and went to [facility] for evaluation by CT surgery. He underwent sternotomy with removal of sternal wires and revision by Dr. Lunette Stands on 03/28/09. He was admitted to [facility] April 2011 with chest pain. Cardiac cath repeated and showed patent LIMA to LAD, patent SVG to OM with occluded SVG to Diagonal and occluded SVG to RCA. EF was 45%. He did well over the next ten years. I saw him April 2021 and he c/o resting chest pain. Exercise stress test May 2021 with exercise induced polymorphic VT. He was seen by Dr. Caryl Comes who recommended cardiac cath and restarting his beta blocker. Cardiac cath May 2021 with 2/4 patent bypass grafts, chronic occlusion of the mid LAD and moderate proximal RCA stenosis that was unchanged from last cath. The vein graft to the RCA is known to be occluded. Echo May 2021 with LVEF=55-60%. No valve disease.   He is here today for follow up. The patient denies any chest pain, dyspnea, palpitations, lower extremity edema, orthopnea, PND, dizziness, near syncope or syncope.     Primary Care Physician: Venia Carbon, MD  Past Medical History:  Diagnosis Date   Allergy    Anxiety    Arthritis    knees and hip   CAD (coronary artery disease)    s/p emergent  4 V CABG (4/10) w 2/4 patent grafts by cath 08/11/09, patent LIMA to LAD & SVG to OM w moderate disease native RCA & occluded grafts to RCA and diagonal   Cataract    Chest pain    secondary to sternal non-union post CABg-now s/p sternotomy w repair 03/28/09 at [facility], dr. 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    Pneumonia 2016   in past    Restless leg syndrome    Schatzki's ring    [place] 2 diabetes mellitus, [place] 6/15   type 2    Past Surgical History:  Procedure Laterality Date   CATARACT EXTRACTION W/PHACO Right 08/18/2016   Procedure: CATARACT EXTRACTION [place]ACO AND INTRAOCULAR LENS PLACEMENT [place] diabetic;  Surgeon: CLeandrew Koyanagi MD;  [place]  Service: Ophthalmology;  Laterality: Right;  Diabetic-oral meds poss Latex allergy   CATARACT EXTRACTION W/PHACO Left 09/15/2016   Procedure: CATARACT EXTRACTION PHACO AND INTRAOCULAR LENS PLACEMENT ([place])  Left diabetic;  Surgeon: Leandrew Koyanagi, MD;  Location: [place];  Service: Ophthalmology;  Laterality: Left;  Diabetic - oral meds   COLON SURGERY  2014   for perforated colon- diverticulitis   COLONOSCOPY  2003,2010   2010 colon normal- 2003 HPP x 1    CORONARY ANGIOPLASTY  2003   stentx2   CORONARY ARTERY BYPASS GRAFT  08/16/2008   x4   ESOPHAGEAL DILATION  ~2009   Dr Earlean Shawl   ILEOSTOMY  2014   with reversal-- after diverticulitis. Sigmoid colectomy also. Dr Ely/Bird    INGUINAL HERNIA REPAIR Left x2   Dr Ernst Spell   LEFT HEART CATH AND CORS/GRAFTS ANGIOGRAPHY N/A 09/27/2019   Procedure: LEFT HEART CATH AND CORS/GRAFTS ANGIOGRAPHY;  Surgeon: Burnell Blanks, MD;  Location: [place] [facility];  Service: Cardiovascular;  Laterality: N/A;   PILONIDAL CYST EXCISION     pilonidal sinus tract excision     POLYPECTOMY  2007   HPP x 1    STERNUM SEPARATION REPAIR  03/2009   [facility]   TONSILLECTOMY  1984   UPPER GASTROINTESTINAL ENDOSCOPY     VASECTOMY      Current Outpatient Medications  Medication Sig Dispense Refill   ALPRAZolam (XANAX) 0.5 MG tablet TAKE 1/2 TABLET BY MOUTH AT BEDTIME 30 tablet 0   aspirin 81 MG EC tablet Take 81 mg by mouth daily.      atorvastatin (LIPITOR) 20 MG tablet Take 1 tablet (20 mg total) by mouth daily. 90 tablet 3   cyclobenzaprine (FLEXERIL) 10 MG tablet Take 0.5-1 tablets (5-10 mg total) by mouth 3 (three) times daily as needed for muscle spasms. 30 tablet 1   glucose blood (ONETOUCH VERIO) test strip Use to check blood sugar once a day. Dx Code E11.9 100 each 12   ibuprofen (ADVIL,MOTRIN) 200 MG tablet Take 400 mg by mouth 2 (two) times daily as needed (pain).      Lancet Devices (ONE TOUCH DELICA LANCING DEV) MISC Use to obtain blood sample for glucose Dx Code E11.9 1 each 0   losartan (COZAAR) 50 MG tablet TAKE ONE TABLET BY MOUTH DAILY 30 tablet 0   metFORMIN (GLUCOPHAGE-XR) 750 MG 24 hr tablet TAKE 1 TABLET (750 MG TOTAL) BY MOUTH DAILY WITH BREAKFAST. 90 tablet 3   metoprolol succinate (TOPROL-XL) 50 MG 24 hr tablet Take 1 tablet (50 mg total) by mouth daily. Take with or immediately following a meal. 90 tablet 3   nitroGLYCERIN (NITROSTAT) 0.4 MG SL tablet Place 1 tablet (0.4 mg total) under the tongue every 5 (five) minutes as needed for chest pain. 25 tablet 6   Omega-3 Fatty Acids (FISH OIL) 1000 MG CAPS Take 1,000 mg by mouth daily.     omeprazole (PRILOSEC) 40 MG capsule Take 1 capsule (40 mg total) by mouth  daily. 90 capsule 3   OneTouch Delica Lancets 16X MISC 1 each by Does not apply route daily. Use to obtain sample for glucose level Dx Code E11.9 100 each 3   traZODone (DESYREL) 100 MG tablet Take 1.5 tablets (150 mg total) by mouth at bedtime as needed for sleep. 135 tablet 3   No current facility-administered medications for this visit.    Allergies  Allergen Reactions   Zolpidem Other (See Comments)    hallucinate   Cephalexin Hives   Keflin [Cephalothin]  rash   Latex Dermatitis    Bandages and adhesives/ poss to latex   Lisinopril Cough    REACTION: cough   Zolpidem Tartrate     UNKNOWN   Lamisil [Terbinafine] Rash    Rash/itchy    History   Social History   Marital Status: Married    Spouse Name: N/A    Number of Children: 3   Years of Education: N/A   Occupational History   Not on file.   Social History Main Topics   Smoking status: Former Smoker    Quit date: 07/18/2008   Smokeless tobacco: Not on file     Comment: No smoking since bypass in 4/10   Alcohol Use: No        Drug Use: No   Sexually Active: Not on file   Other Topics Concern   Not on file   Social History Narrative       Family History  Problem Relation Age of Onset   Heart failure Mother    Diabetes Mother    Heart disease Mother    Congestive Heart Failure Mother        cause of death   Cancer Mother        ovarian?   Heart attack Father    Heart attack Sister    Diabetes Sister    Heart attack Brother    Diabetes Brother    Cancer Brother        Bladder   Heart attack Brother    Diabetes Brother    Bladder Cancer Brother    Cancer Maternal Aunt        died of breast cancer   Breast cancer Maternal Aunt    Cancer Maternal Grandmother        died of bone cancer   Bone cancer Maternal Grandmother    Colon cancer Neg Hx    Colon polyps Neg Hx    Esophageal cancer Neg Hx    Rectal cancer Neg Hx    Stomach cancer Neg Hx     Review of Systems:  As stated in the  HPI and otherwise negative.   There were no vitals taken for this visit.  Physical Examination:  General: Well developed, well nourished, NAD  HEENT: OP clear, mucus membranes moist  SKIN: warm, dry. No rashes. Neuro: No focal deficits  Musculoskeletal: Muscle strength 5/5 all ext  Psychiatric: Mood and affect normal  Neck: No JVD, no carotid bruits, no thyromegaly, no lymphadenopathy.  Lungs:Clear bilaterally, no wheezes, rhonci, crackles Cardiovascular: Regular rate and rhythm. No murmurs, gallops or rubs. Abdomen:Soft. Bowel sounds present. Non-tender.  Extremities: No lower extremity edema. Pulses are 2 + in the bilateral DP/PT.  EKG:  EKG is ***  ordered today. The ekg ordered today demonstrates    Echo May 2021:  1. Left ventricular ejection fraction, by estimation, is 55 to 60%. The  left ventricle has normal function. The left ventricle has no regional  wall motion abnormalities. There is mild concentric left ventricular  hypertrophy. Left ventricular diastolic  parameters are consistent with Grade I diastolic dysfunction (impaired  relaxation).   2. Right ventricular systolic function is normal. The right ventricular  size is normal. There is normal pulmonary artery systolic pressure.   3. The mitral valve is normal in structure. No evidence of mitral valve  regurgitation. No evidence of mitral stenosis.   4. The aortic valve is tricuspid. Aortic valve regurgitation is not  visualized. No aortic stenosis  is present.   5. The inferior vena cava is normal in size with greater than 50%  respiratory variability, suggesting right atrial pressure of 3 mmHg.   Recent Labs: 03/20/2021: ALT 51; BUN 20; Creatinine, Ser 1.14; Hemoglobin 15.4; Platelets 135.0; Potassium 4.2; Sodium 135   Lipid Panel    Component Value Date/Time   CHOL 104 03/20/2021 0927   CHOL 92 09/14/2012 1521   TRIG 163.0 (H) 03/20/2021 0927   TRIG 230 (H) 09/14/2012 1521   HDL 28.50 (L) 03/20/2021 0927    HDL 22 (L) 09/14/2012 1521   CHOLHDL 4 03/20/2021 0927   VLDL 32.6 03/20/2021 0927   VLDL 46 (H) 09/14/2012 1521   LDLCALC 43 03/20/2021 0927   LDLCALC 24 09/14/2012 1521   LDLDIRECT 67.0 02/23/2019 1228     Wt Readings from Last 3 Encounters:  03/20/21 163 lb (73.9 kg)  01/07/21 163 lb (73.9 kg)  09/17/20 160 lb (72.6 kg)     Other studies Reviewed: Additional studies/ records that were reviewed today include: . Review of the above records demonstrates:   Assessment and Plan:   1. CAD s/p CABG without angina: He has no chest pain. CAD stable by cath in May 2021. Will continue ASA, beta blocker and statin  2. Tobacco abuse, in remission: No tobacco use since 2010.   3. HTN: BP is well controlled. No changes  4. Non-sustained ventricular tachycardia: LV function normal by echo May 2021. Will continue beta blocker.   Current medicines are reviewed at length with the patient today.  The patient does not have concerns regarding medicines.  The following changes have been made:  no change  Labs/ tests ordered today include:   No orders of the defined types were placed in this encounter.   Disposition:   F/U with me in 12 months  Signed, [name], MD 09/15/2021 7:42 PM    [facility] [place], [place], [place]  [address] Phone: [phone]; Fax: [phone]

## 2021-09-16 ENCOUNTER — Ambulatory Visit (INDEPENDENT_AMBULATORY_CARE_PROVIDER_SITE_OTHER): Payer: Medicare Other | Admitting: Cardiovascular Disease

## 2021-09-16 ENCOUNTER — Encounter: Payer: Self-pay | Admitting: Cardiovascular Disease

## 2021-09-16 VITALS — BP 116/70 | HR 56 | Ht 69.0 in | Wt 160.4 lb

## 2021-09-16 DIAGNOSIS — I10 Essential (primary) hypertension: Secondary | ICD-10-CM | POA: Diagnosis not present

## 2021-09-16 DIAGNOSIS — I472 Ventricular tachycardia, unspecified: Secondary | ICD-10-CM

## 2021-09-16 DIAGNOSIS — I25810 Atherosclerosis of coronary artery bypass graft(s) without angina pectoris: Secondary | ICD-10-CM | POA: Diagnosis not present

## 2021-09-16 NOTE — Patient Instructions (Signed)
Medication Instructions:  No changes *If you need a refill on your cardiac medications before your next appointment, please call your pharmacy*   Lab Work: none If you have labs (blood work) drawn today and your tests are completely normal, you will receive your results only by: MyChart Message (if you have MyChart) OR A paper copy in the mail If you have any lab test that is abnormal or we need to change your treatment, we will call you to review the results.   Testing/Procedures: none   Follow-Up: At [facility], you and your health needs are our priority.  As part of our continuing mission to provide you with exceptional heart care, we have created designated Provider Care Teams.  These Care Teams include your primary Cardiologist (physician) and Advanced Practice Providers (APPs -  Physician Assistants and Nurse Practitioners) who all work together to provide you with the care you need, when you need it.   Your next appointment:   12 month(s)  The format for your next appointment:   In Person  Provider:   Christopher McAlhany, MD     Important Information About Sugar       

## 2021-09-18 ENCOUNTER — Ambulatory Visit (INDEPENDENT_AMBULATORY_CARE_PROVIDER_SITE_OTHER): Payer: Medicare Other | Admitting: Internal Medicine

## 2021-09-18 ENCOUNTER — Encounter: Payer: Self-pay | Admitting: Internal Medicine

## 2021-09-18 VITALS — BP 116/84 | HR 68 | Temp 97.3°F | Ht 69.0 in | Wt 159.0 lb

## 2021-09-18 DIAGNOSIS — I25810 Atherosclerosis of coronary artery bypass graft(s) without angina pectoris: Secondary | ICD-10-CM | POA: Diagnosis not present

## 2021-09-18 DIAGNOSIS — M7712 Lateral epicondylitis, left elbow: Secondary | ICD-10-CM | POA: Insufficient documentation

## 2021-09-18 DIAGNOSIS — E1159 Type 2 diabetes mellitus with other circulatory complications: Secondary | ICD-10-CM | POA: Diagnosis not present

## 2021-09-18 DIAGNOSIS — I10 Essential (primary) hypertension: Secondary | ICD-10-CM

## 2021-09-18 DIAGNOSIS — M170 Bilateral primary osteoarthritis of knee: Secondary | ICD-10-CM | POA: Diagnosis not present

## 2021-09-18 LAB — POCT GLYCOSYLATED HEMOGLOBIN (HGB A1C): Hemoglobin A1C: 6.1 % — AB (ref 4.0–5.6)

## 2021-09-18 NOTE — Assessment & Plan Note (Signed)
Lab Results  Component Value Date   HGBA1C 6.1 (A) 09/18/2021   Still excellent control on metformin 750 daily

## 2021-09-18 NOTE — Assessment & Plan Note (Signed)
Discussed tendon strap and topical diclofenac

## 2021-09-18 NOTE — Assessment & Plan Note (Signed)
Mild Discussed tylenol prn

## 2021-09-18 NOTE — Patient Instructions (Signed)
You can try topical diclofenac for your elbow--or even your knee

## 2021-09-18 NOTE — Assessment & Plan Note (Signed)
BP Readings from Last 3 Encounters:  09/18/21 116/84  09/16/21 116/70  03/20/21 112/78   Good control on metoprolol50, losartan 50

## 2021-09-18 NOTE — Progress Notes (Signed)
Subjective:    Patient ID: Joseph [name], male    DOB: May 20, 1955, 66 y.o.   MRN: [id number]  HPI Here for follow up of diabetes and other medical conditions  Has had increased headaches--mostly allergy season Left frontal mostly Using ibuprofen 2-3 times a week Better now with decreased tree pollen Hasn't used antihistamines  Has dilated vein over biceps in left arm Occasional throbbing   Some trouble with left elbow After picking up heavy chairs about 6 weeks ago--pain since then Mostly with lifting  Discussed tennis elbow  Also has spot under left ribs--just medial to anterior axillary line With stretching or getting into bed--really hurts No cough or SOB  Still has knee pain---if going up and down stairs a lot No leg strengthening  Had 4 days of left heel pain---intermittent Gone now  Sugars okay--checks every morning Average 125 No foot numbness or pain  No chest pain No SOB No dizziness  Current Outpatient Medications on File Prior to Visit  Medication Sig Dispense Refill   ALPRAZolam (XANAX) 0.5 MG tablet TAKE 1/2 TABLET BY MOUTH AT BEDTIME 30 tablet 0   aspirin 81 MG EC tablet Take 81 mg by mouth daily.      atorvastatin (LIPITOR) 20 MG tablet Take 1 tablet (20 mg total) by mouth daily. 90 tablet 3   cyclobenzaprine (FLEXERIL) 10 MG tablet Take 0.5-1 tablets (5-10 mg total) by mouth 3 (three) times daily as needed for muscle spasms. 30 tablet 1   glucose blood (ONETOUCH VERIO) test strip Use to check blood sugar once a day. Dx Code E11.9 100 each 12   ibuprofen (ADVIL,MOTRIN) 200 MG tablet Take 400 mg by mouth 2 (two) times daily as needed (pain).      Lancet Devices (ONE TOUCH DELICA LANCING DEV) MISC Use to obtain blood sample for glucose Dx Code E11.9 1 each 0   losartan (COZAAR) 50 MG tablet TAKE ONE TABLET BY MOUTH DAILY 30 tablet 0   metFORMIN (GLUCOPHAGE-XR) 750 MG 24 hr tablet TAKE 1 TABLET (750 MG TOTAL) BY MOUTH DAILY WITH BREAKFAST. 90 tablet 3    metoprolol succinate (TOPROL-XL) 50 MG 24 hr tablet Take 1 tablet (50 mg total) by mouth daily. Take with or immediately following a meal. 90 tablet 3   nitroGLYCERIN (NITROSTAT) 0.4 MG SL tablet Place 1 tablet (0.4 mg total) under the tongue every 5 (five) minutes as needed for chest pain. 25 tablet 6   Omega-3 Fatty Acids (FISH OIL) 1000 MG CAPS Take 1,000 mg by mouth daily.     omeprazole (PRILOSEC) 40 MG capsule Take 1 capsule (40 mg total) by mouth daily. 90 capsule 3   OneTouch Delica Lancets 02O MISC 1 each by Does not apply route daily. Use to obtain sample for glucose level Dx Code E11.9 100 each 3   traZODone (DESYREL) 100 MG tablet Take 1.5 tablets (150 mg total) by mouth at bedtime as needed for sleep. 135 tablet 3   [DISCONTINUED] pravastatin (PRAVACHOL) 40 MG tablet TAKE 1 TABLET BY MOUTH DAILY. 90 tablet 3   No current facility-administered medications on file prior to visit.    Allergies  Allergen Reactions   Zolpidem Other (See Comments)    hallucinate   Cephalexin Hives   Keflin [Cephalothin]     rash   Latex Dermatitis    Bandages and adhesives/ poss to latex   Lisinopril Cough    REACTION: cough   Zolpidem Tartrate     UNKNOWN  Lamisil [Terbinafine] Rash    Rash/itchy    Past Medical History:  Diagnosis Date   Allergy    Anxiety    Arthritis    knees and hip   CAD (coronary artery disease)    s/p emergent 4 V CABG (4/10) w 2/4 patent grafts by cath 08/11/09, patent LIMA to LAD & SVG to OM w moderate disease native RCA & occluded grafts to RCA and diagonal   Cataract    Chest pain    secondary to sternal non-union post CABg-now s/p sternotomy w repair 03/28/09 at [facility], dr. 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 [facility]    Pneumonia 2016   in past    Restless leg syndrome    Schatzki's ring    Type 2 diabetes mellitus, controlled ([place] 6/15   type 2    Past Surgical History:  Procedure Laterality Date   CATARACT EXTRACTION W/PHACO Right 08/18/2016   Procedure: CATARACT EXTRACTION PHACO AND INTRAOCULAR LENS PLACEMENT ([place] Right diabetic;  Surgeon: CLeandrew Koyanagi MD;  [place]  Service: Ophthalmology;  Laterality: Right;  Diabetic-oral meds poss Latex allergy   CATARACT EXTRACTION W/PHACO Left 09/15/2016   Procedure: CATARACT EXTRACTION PHACO AND INTRAOCULAR LENS PLACEMENT ([place]  Left diabetic;  Surgeon: BLeandrew Koyanagi MD;  [place]  Service: Ophthalmology;  Laterality: Left;  Diabetic - oral meds   COLON SURGERY  2014   for perforated colon- diverticulitis   COLONOSCOPY  2003,2010   2010 colon normal- 2003 HPP x 1    CORONARY ANGIOPLASTY  2003   stentx2   CORONARY ARTERY BYPASS GRAFT  08/16/2008   x4   ESOPHAGEAL DILATION  ~2009   Dr MEarlean Shawl  ILEOSTOMY  2014   with reversal-- after diverticulitis. Sigmoid colectomy also. Dr Ely/Bird   INGUINAL HERNIA REPAIR Left x2   Dr HErnst Spell  LEFT HEART CATH AND CORS/GRAFTS ANGIOGRAPHY N/A 09/27/2019   Procedure: LEFT HEART CATH AND CORS/GRAFTS ANGIOGRAPHY;  Surgeon: MBurnell Blanks MD;  [facility];  Service: Cardiovascular;  Laterality: N/A;   PILONIDAL CYST EXCISION     pilonidal sinus tract excision     POLYPECTOMY  2007   HPP x 1    STERNUM SEPARATION REPAIR  03/2009   [facility]   TONSILLECTOMY  1984   UPPER GASTROINTESTINAL ENDOSCOPY     VASECTOMY      Family History  Problem Relation Age of Onset    Heart failure Mother    Diabetes Mother    Heart disease Mother    Congestive Heart Failure Mother        cause of death   Cancer Mother        ovarian?   Heart attack Father    Heart attack Sister    Diabetes Sister    Heart attack Brother    Diabetes Brother    Cancer Brother  Bladder   Heart attack Brother    Diabetes Brother    Bladder Cancer Brother    Cancer Maternal Aunt        died of breast cancer   Breast cancer Maternal Aunt    Cancer Maternal Grandmother        died of bone cancer   Bone cancer Maternal Grandmother    Colon cancer Neg Hx    Colon polyps Neg Hx    Esophageal cancer Neg Hx    Rectal cancer Neg Hx    Stomach cancer Neg Hx     Social History   Socioeconomic History   Marital status: Married    Spouse name: Not on file   Number of children: 3   Years of education: Not on file   Highest education level: Not on file  Occupational History   Occupation: Animator    Comment: Disabled   Occupation: Florist delivery    Comment: part time  Tobacco Use   Smoking status: Former    Packs/day: 0.50    Years: 37.00    Pack years: 18.50    Types: Cigarettes    Quit date: 07/18/2008    Years since quitting: 13.1   Smokeless tobacco: Never   Tobacco comments:    No smoking since bypass in 4/10  Vaping Use   Vaping Use: Never used  Substance and Sexual Activity   Alcohol use: Yes    Alcohol/week: 0.0 standard drinks    Comment: rarely   Drug use: No   Sexual activity: Not on file  Other Topics Concern   Not on file  Social History Narrative   Has living will   Wife is health care POA---alternate is daughter Ria Comment   Would accept resuscitation   Not sure about tube feeds--probably not prolonged   Social Determinants of Health   Financial Resource Strain: Not on file  Food Insecurity: Not on file  Transportation Needs: Not on file  Physical Activity: Not on file  Stress: Not on file  Social Connections: Not on  file  Intimate Partner Violence: Not on file   Review of Systems Weight is stable Appetite is good Sleeps well trazodone and alprazolam     Objective:   Physical Exam Constitutional:      Appearance: Normal appearance.  Cardiovascular:     Rate and Rhythm: Normal rate and regular rhythm.     Pulses: Normal pulses.     Heart sounds: No murmur heard.   No gallop.  Pulmonary:     Effort: Pulmonary effort is normal.     Breath sounds: Normal breath sounds. No wheezing or rales.  Musculoskeletal:     Cervical back: Neck supple.     Right lower leg: No edema.     Left lower leg: No edema.     Comments: Mild tenderness lateral epicondyle on left Slightly compressible vein over left biceps Left knee ---no swelling or ligament/meniscus findings  Lymphadenopathy:     Cervical: No cervical adenopathy.  Neurological:     Mental Status: He is alert.  Psychiatric:        Mood and Affect: Mood normal.        Behavior: Behavior normal.           Assessment & Plan:

## 2021-09-23 ENCOUNTER — Other Ambulatory Visit: Payer: Self-pay | Admitting: Internal Medicine

## 2021-09-24 NOTE — Telephone Encounter (Signed)
Last office visit 09/18/21 for 6 month follow up.  Last refilled 07/28/21 for #30 with no refills.  CPE scheduled 04/02/22.

## 2021-09-30 ENCOUNTER — Telehealth: Payer: Self-pay | Admitting: Internal Medicine

## 2021-09-30 NOTE — Telephone Encounter (Signed)
Spoke to patient by telephone and was advised that he feels just fine. Patient stated that his blood sugar this morning fasting was 169, then he ate a bowl of cereal. Patient stated that he checked his blood sugar at 11:30 am and it was 218, then at 12:15 149. Patient stated that he was taking two Metformin daily but that was changed to once every am.Patient wants to know what Dr. Silvio Pate thinks that he should do since his blood sugar is running high now.

## 2021-09-30 NOTE — Telephone Encounter (Signed)
Pt called and said his blood sugar readings have been higher than normal for the last week. Last one was 218 which was on 09/30/21 around 11. 169,158 and others similar readings this week, he said his normal around 125. He would like a call back at  [phone]

## 2021-09-30 NOTE — Telephone Encounter (Signed)
Patient notified as instructed by telephone and verbalized understanding. Patient stated that he will continue to monitor his blood sugar and will call Dr. Silvio Pate back and let him know if he feels that he should go back on two Metformin daily.

## 2021-10-09 ENCOUNTER — Other Ambulatory Visit: Payer: Self-pay | Admitting: Cardiovascular Disease

## 2021-10-17 ENCOUNTER — Ambulatory Visit
Admission: EM | Admit: 2021-10-17 | Discharge: 2021-10-17 | Disposition: A | Discharge status: Home / Self Care | Payer: Medicare Other

## 2021-10-17 DIAGNOSIS — B349 Viral infection, unspecified: Secondary | ICD-10-CM | POA: Diagnosis not present

## 2021-10-17 NOTE — ED Provider Notes (Signed)
Joseph [name]    CSN: [id number] Arrival date & time: 10/17/21  1553      History   Chief Complaint Chief Complaint  Patient presents with   Fever    Entered by patient   Generalized Body Aches   Gastroesophageal Reflux    HPI Joseph [name] is a 66 y.o. male.  Accompanied by Joseph [name], patient presents with fever and body aches since last night.  Tmax 100.4.  Treatment at home with ibuprofen.  Patient had reflux symptoms during the night; he has a history of pneumonia and is concerned for aspiration.  He denies ear pain, sore throat, chest pain, cough, shortness of breath, vomiting, diarrhea, or other symptoms.  Joseph medical history includes pneumonia, diabetes, hypertension, MI, CAD, diverticulitis, GERD, kidney stones, fatty liver, mood disorder.  The history is provided by the patient, the spouse and medical records.    Past Medical History:  Diagnosis Date   Allergy    Anxiety    Arthritis    knees and hip   CAD (coronary artery disease)    s/p emergent 4 V CABG (4/10) w 2/4 patent grafts by cath 08/11/09, patent LIMA to LAD & SVG to OM w moderate disease native RCA & occluded grafts to RCA and diagonal   Cataract    Chest pain    secondary to sternal non-union post CABg-now s/p sternotomy w repair 03/28/09 at [facility], dr. 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 [facility]    Pneumonia 2016   in past    Restless leg syndrome    Schatzki's ring    Type 2 diabetes mellitus,  controlled ([place] 6/15   type 2    Patient Active Problem List   Diagnosis Date Noted   Lateral epicondylitis of left elbow 09/18/2021   Osteoarthritis of both knees 09/18/2021   [place]rombocyt[place] ([place] 03/20/2020   Advance directive discussed with patient 02/23/2019   Chronic low back pain 07/30/2015   Preventative health care 07/24/2014   Type 2 diabetes mellitus with other circulatory complications (HCC)    GERD (gastroesophageal reflux disease)    CAD (coronary artery disease) 12/08/2010   HYPERTENSION, BENIGN 04/28/2009   CARDIOMYOPATHY, ISCHEMIC 04/28/2009   Mood disorder ([place] 12/10/2008   Hyperlipidemia 09/03/2008   ERECTILE DYSFUNCTION 09/03/2008   Coronary atherosclerosis of native coronary artery 09/03/2008   CAD, ARTERY BYPASS GRAFT 09/03/2008    Past Surgical History:  Procedure Laterality Date   CATARACT EXTRACTION W/PHACO Right 08/18/2016   Procedure: CATARACT EXTRACTION PHACO AND INTRAOCULAR LENS PLACEMENT ([place] Right diabetic;  Surgeon: CLeandrew Koyanagi MD;  [place]  Service: Ophthalmology;  Laterality: Right;  Diabetic-oral meds poss Latex allergy   CATARACT EXTRACTION W/PHACO Left 09/15/2016   Procedure: CATARACT EXTRACTION PHACO AND INTRAOCULAR LENS PLACEMENT ([place]  Left diabetic;  Surgeon: BLeandrew Koyanagi MD;  [place]  Service: Ophthalmology;  Laterality: Left;  Diabetic -  oral meds   COLON SURGERY  2014   for perforated colon- diverticulitis   COLONOSCOPY  2003,2010   2010 colon normal- 2003 HPP x 1    CORONARY ANGIOPLASTY  2003   stentx2   CORONARY ARTERY BYPASS GRAFT  08/16/2008   x4   ESOPHAGEAL DILATION  ~2009   Dr Earlean Shawl   ILEOSTOMY  2014   with reversal-- after diverticulitis. Sigmoid colectomy also. Dr Ely/Bird   INGUINAL HERNIA REPAIR Left x2   Dr Ernst Spell   LEFT HEART CATH AND CORS/GRAFTS ANGIOGRAPHY N/A 09/27/2019   Procedure: LEFT HEART CATH AND CORS/GRAFTS ANGIOGRAPHY;  Surgeon: Burnell Blanks, MD;  Location: [place] [facility];  Service: Cardiovascular;  Laterality: N/A;   PILONIDAL CYST EXCISION     pilonidal sinus tract excision     POLYPECTOMY  2007   HPP x 1    STERNUM SEPARATION REPAIR  03/2009   [facility]   TONSILLECTOMY  1984   UPPER GASTROINTESTINAL ENDOSCOPY     VASECTOMY         Home Medications    Prior to Admission medications   Medication Sig Start Date End Date Taking? Authorizing Provider  ALPRAZolam Duanne Moron) 0.5 MG tablet TAKE 1/2 TABLET BY MOUTH AT BEDTIME 09/24/21   Dutch Quint B, FNP  aspirin 81 MG EC tablet Take 81 mg by mouth daily.     [provider]  atorvastatin (LIPITOR) 20 MG tablet Take 1 tablet (20 mg total) by mouth daily. 07/08/21   Venia Carbon, MD  cyclobenzaprine (FLEXERIL) 10 MG tablet Take 0.5-1 tablets (5-10 mg total) by mouth 3 (three) times daily as needed for muscle spasms. 07/30/15   Viviana Simpler I, MD  glucose blood (ONETOUCH VERIO) test strip Use to check blood sugar once a day. Dx Code E11.9 11/21/18   Venia Carbon, MD  ibuprofen (ADVIL,MOTRIN) 200 MG tablet Take 400 mg by mouth 2 (two) times daily as needed (pain).     [provider]  Lancet Devices (ONE TOUCH DELICA LANCING DEV) MISC Use to obtain blood sample for glucose Dx Code E11.9 11/21/18   Venia Carbon, MD  losartan (COZAAR) 50 MG tablet TAKE 1 TABLET BY MOUTH DAILY 10/09/21   Burnell Blanks, MD  metFORMIN (GLUCOPHAGE-XR) 750 MG 24 hr tablet TAKE 1 TABLET (750 MG TOTAL) BY MOUTH DAILY WITH BREAKFAST. 07/08/21   Venia Carbon, MD  metoprolol succinate (TOPROL-XL) 50 MG 24 hr tablet Take 1 tablet (50 mg total) by mouth daily. Take with or immediately following a meal. 07/08/21   Venia Carbon, MD  nitroGLYCERIN (NITROSTAT) 0.4 MG SL tablet Place 1 tablet (0.4 mg total) under the tongue every 5 (five) minutes as needed for chest pain. 07/17/20   Burnell Blanks, MD  Omega-3 Fatty Acids (FISH OIL) 1000 MG CAPS Take  1,000 mg by mouth daily.    [provider]  omeprazole (PRILOSEC) 40 MG capsule Take 1 capsule (40 mg total) by mouth daily. 07/08/21   Venia Carbon, MD  OneTouch Delica Lancets 16X MISC 1 each by Does not apply route daily. Use to obtain sample for glucose level Dx Code E11.9 11/21/18   Venia Carbon, MD  traZODone (DESYREL) 100 MG tablet Take 1.5 tablets (150 mg total) by mouth at bedtime as needed for sleep. 05/21/21   Venia Carbon, MD  pravastatin (PRAVACHOL) 40 MG tablet TAKE 1 TABLET BY MOUTH DAILY. 05/03/19 07/30/19  Venia Carbon, MD  Family History Family History  Problem Relation Age of Onset   Heart failure Mother    Diabetes Mother    Heart disease Mother    Congestive Heart Failure Mother        cause of death   Cancer Mother        ovarian?   Heart attack Father    Heart attack Sister    Diabetes Sister    Heart attack Brother    Diabetes Brother    Cancer Brother        Bladder   Heart attack Brother    Diabetes Brother    Bladder Cancer Brother    Cancer Maternal Aunt        died of breast cancer   Breast cancer Maternal Aunt    Cancer Maternal Grandmother        died of bone cancer   Bone cancer Maternal Grandmother    Colon cancer Neg Hx    Colon polyps Neg Hx    Esophageal cancer Neg Hx    Rectal cancer Neg Hx    Stomach cancer Neg Hx     Social History Social History   Tobacco Use   Smoking status: Former    Packs/day: 0.50    Years: 37.00    Total pack years: 18.50    Types: Cigarettes    Quit date: 07/18/2008    Years since quitting: 13.2   Smokeless tobacco: Never   Tobacco comments:    No smoking since bypass in 4/10  Vaping Use   Vaping Use: Never used  Substance Use Topics   Alcohol use: Yes    Alcohol/week: 0.0 standard drinks of alcohol    Comment: rarely   Drug use: No     Allergies   Zolpidem, Cephalexin, Keflin [cephalothin], Latex, Lisinopril, Zolpidem tartrate, and Lamisil [terbinafine]   Review  of Systems Review of Systems  Constitutional:  Positive for fever. Negative for chills.  HENT:  Negative for ear pain and sore throat.   Respiratory:  Negative for cough and shortness of breath.   Cardiovascular:  Negative for chest pain and palpitations.  Gastrointestinal:  Negative for diarrhea and vomiting.  Musculoskeletal:        Body aches  Skin:  Negative for color change and rash.  All other systems reviewed and are negative.    Physical Exam Triage Vital Signs ED Triage Vitals  Enc Vitals Group     BP      Pulse      Resp      Temp      Temp src      SpO2      Weight      Height      Head Circumference      Peak Flow      Pain Score      Pain Loc      Pain Edu?      Excl. in [place]?    No data found.  Updated Vital Signs BP (!) 143/83   Pulse 73   Temp 99 F (37.2 C)   Resp 18   SpO2 96%   Visual Acuity Right Eye Distance:   Left Eye Distance:   Bilateral Distance:    Right Eye Near:   Left Eye Near:    Bilateral Near:     Physical Exam Vitals and nursing note reviewed.  Constitutional:      General: He is not in acute distress.  Appearance: Normal appearance. He is well-developed. He is not ill-appearing.  HENT:     Right Ear: Tympanic membrane normal.     Left Ear: Tympanic membrane normal.     Nose: Nose normal.     Mouth/Throat:     Mouth: Mucous membranes are moist.     Pharynx: Oropharynx is clear.  Cardiovascular:     Rate and Rhythm: Normal rate and regular rhythm.     Heart sounds: Normal heart sounds.  Pulmonary:     Effort: Pulmonary effort is normal. No respiratory distress.     Breath sounds: Normal breath sounds. No wheezing, rhonchi or rales.  Musculoskeletal:     Cervical back: Neck supple.  Skin:    General: Skin is warm and dry.  Neurological:     Mental Status: He is alert.  Psychiatric:        Mood and Affect: Mood normal.        Behavior: Behavior normal.      UC Treatments / Results  Labs (all labs  ordered are listed, but only abnormal results are displayed) Labs Reviewed - No data to display  EKG   Radiology No results found.  Procedures Procedures (including critical care time)  Medications Ordered in UC Medications - No data to display  Initial Impression / Assessment and Plan / UC Course  I have reviewed the triage vital signs and the nursing notes.  Pertinent labs & imaging results that were available during my care of the patient were reviewed by me and considered in my medical decision making (see chart for details).    Viral illness.  Patient and Joseph [name] decline chest x-ray at this time.  They will return tomorrow if Joseph symptoms worsen.  Otherwise they will follow-up with Joseph PCP on Monday if needed.  ED precautions discussed.  Suggested at home COVID test today; they decline PCR test here.  Discussed symptomatic treatment with Tylenol or ibuprofen as needed.  Education provided on viral illness.  They agree to plan of care.  Final Clinical Impressions(s) / UC Diagnoses   Final diagnoses:  Viral illness     Discharge Instructions      Take Tylenol or ibuprofen as needed for fever or discomfort.  Follow-up with your primary care provider if your symptoms are not improving.     ED Prescriptions   None    PDMP not reviewed this encounter.   Sharion Balloon, NP 10/17/21 [phone]

## 2021-10-17 NOTE — Discharge Instructions (Addendum)
Take Tylenol or ibuprofen as needed for fever or discomfort.  Follow-up with your primary care provider if your symptoms are not improving.

## 2021-10-17 NOTE — ED Triage Notes (Signed)
Patient presents to Urgent Care with complaints of a fever,body aches, and acid reflux since 0200. Took 2 doses of ibuprofen. Pt has a hx of pneumonia.

## 2021-11-24 ENCOUNTER — Other Ambulatory Visit: Payer: Self-pay | Admitting: Internal Medicine

## 2021-11-24 MED ORDER — ALPRAZOLAM 0.5 MG PO TABS
0.2500 mg | ORAL_TABLET | Freq: Every day | ORAL | 0 refills | Status: DC
Start: 2021-11-24 — End: 2022-01-25

## 2021-11-24 NOTE — Telephone Encounter (Signed)
  Encourage patient to contact the pharmacy for refills or they can request refills through [facility]  Did the patient contact the pharmacy: Yes   LAST APPOINTMENT DATE: 09/18/21  NEXT APPOINTMENT DATE: 04/02/22  MEDICATION: ALPRAZolam (XANAX) 0.5 MG tablet  Is the patient out of medication? No  If not, how much is left? Enough until Friday   PHARMACY: [facility] [phone] - [address], [place]  Let patient know to contact pharmacy at the end of the day to make sure medication is ready.  Please notify patient to allow 48-72 hours to process

## 2022-01-08 ENCOUNTER — Ambulatory Visit (INDEPENDENT_AMBULATORY_CARE_PROVIDER_SITE_OTHER): Payer: Medicare Other | Admitting: Podiatry

## 2022-01-08 ENCOUNTER — Encounter: Payer: Self-pay | Admitting: Podiatry

## 2022-01-08 DIAGNOSIS — L309 Dermatitis, unspecified: Secondary | ICD-10-CM | POA: Diagnosis not present

## 2022-01-08 DIAGNOSIS — I25810 Atherosclerosis of coronary artery bypass graft(s) without angina pectoris: Secondary | ICD-10-CM

## 2022-01-10 NOTE — Progress Notes (Signed)
Subjective:   Patient ID: Joseph [name], male   DOB: 66 y.o.   MRN: [id number]   HPI Patient presents with caregiver can concerned about the possibility for some slight change in skin structure left and right plantar foot with no current pain   ROS      Objective:  Physical Exam  Neurovascular status unchanged with patient found to be relatively healthy with the patient noted to have slight discoloration plantar aspect left over right foot and no specific pattern     Assessment:  Probability for some small skin irritations lesions nothing that appears to be significant     Plan:  Reviewed inspections of this and if it were to change colors become darker or grow or become irregular I would like to see this again

## 2022-01-12 ENCOUNTER — Ambulatory Visit: Payer: Medicare Other

## 2022-01-19 ENCOUNTER — Ambulatory Visit (INDEPENDENT_AMBULATORY_CARE_PROVIDER_SITE_OTHER): Payer: Medicare Other

## 2022-01-19 DIAGNOSIS — Z23 Encounter for immunization: Secondary | ICD-10-CM | POA: Diagnosis not present

## 2022-01-25 ENCOUNTER — Other Ambulatory Visit: Payer: Self-pay | Admitting: Internal Medicine

## 2022-01-25 NOTE — Telephone Encounter (Signed)
Last filled 11-24-21 #30 Last OV 09-18-21 Next OV 04-02-22 [facility]

## 2022-01-26 DIAGNOSIS — E119 Type 2 diabetes mellitus without complications: Secondary | ICD-10-CM | POA: Diagnosis not present

## 2022-01-26 LAB — HM DIABETES EYE EXAM

## 2022-02-17 DIAGNOSIS — Z23 Encounter for immunization: Secondary | ICD-10-CM | POA: Diagnosis not present

## 2022-02-22 ENCOUNTER — Encounter: Payer: Self-pay | Admitting: Internal Medicine

## 2022-02-22 ENCOUNTER — Ambulatory Visit (INDEPENDENT_AMBULATORY_CARE_PROVIDER_SITE_OTHER): Payer: Medicare Other | Admitting: Internal Medicine

## 2022-02-22 VITALS — BP 130/84 | HR 61 | Temp 97.2°F | Ht 69.0 in | Wt 161.0 lb

## 2022-02-22 DIAGNOSIS — I25810 Atherosclerosis of coronary artery bypass graft(s) without angina pectoris: Secondary | ICD-10-CM | POA: Diagnosis not present

## 2022-02-22 DIAGNOSIS — S161XXA Strain of muscle, fascia and tendon at neck level, initial encounter: Secondary | ICD-10-CM | POA: Insufficient documentation

## 2022-02-22 MED ORDER — CYCLOBENZAPRINE HCL 10 MG PO TABS: 5.0000 mg | ORAL_TABLET | Freq: Three times a day (TID) | ORAL | 1 refills | Status: DC | PRN

## 2022-02-22 NOTE — Progress Notes (Signed)
Subjective:    Patient ID: Joseph [name], male    DOB: 09-Dec-1955, 66 y.o.   MRN: [id number]  HPI Here due to neck pain With wife  Started about 10 days ago Worsened day by day--had improved till 2 days ago--then worsened Tried cyclobenzaprine a week ago---helped some at bedtime  Doesn't remember any injury--awoke with it Wife thinks he sleeps wrong sometimes Uses 2 pillows Pain on right side--points to occiput (SCM vs trapezius) Had radiated to clavicle Still has normal ROM--but limiting it due to pain Pain worse when he moves up Tried heating pad last night--felt good No radiation past shoulder  Has tried ibuprofen '400mg'$  twice a day generally----it helps Also used diclofenac gel--and salon pas patch---all some help  Current Outpatient Medications on File Prior to Visit  Medication Sig Dispense Refill   ALPRAZolam (XANAX) 0.5 MG tablet TAKE 1/2 TABLETS BY MOUTH AT BEDTIME 30 tablet 0   aspirin 81 MG EC tablet Take 81 mg by mouth daily.      atorvastatin (LIPITOR) 20 MG tablet Take 1 tablet (20 mg total) by mouth daily. 90 tablet 3   cyclobenzaprine (FLEXERIL) 10 MG tablet Take 0.5-1 tablets (5-10 mg total) by mouth 3 (three) times daily as needed for muscle spasms. 30 tablet 1   glucose blood (ONETOUCH VERIO) test strip Use to check blood sugar once a day. Dx Code E11.9 100 each 12   ibuprofen (ADVIL,MOTRIN) 200 MG tablet Take 400 mg by mouth 2 (two) times daily as needed (pain).      Lancet Devices (ONE TOUCH DELICA LANCING DEV) MISC Use to obtain blood sample for glucose Dx Code E11.9 1 each 0   losartan (COZAAR) 50 MG tablet TAKE 1 TABLET BY MOUTH DAILY 90 tablet 3   metFORMIN (GLUCOPHAGE-XR) 750 MG 24 hr tablet TAKE 1 TABLET (750 MG TOTAL) BY MOUTH DAILY WITH BREAKFAST. 90 tablet 3   metoprolol succinate (TOPROL-XL) 50 MG 24 hr tablet Take 1 tablet (50 mg total) by mouth daily. Take with or immediately following a meal. 90 tablet 3   nitroGLYCERIN (NITROSTAT) 0.4 MG SL  tablet Place 1 tablet (0.4 mg total) under the tongue every 5 (five) minutes as needed for chest pain. 25 tablet 6   Omega-3 Fatty Acids (FISH OIL) 1000 MG CAPS Take 1,000 mg by mouth daily.     omeprazole (PRILOSEC) 40 MG capsule Take 1 capsule (40 mg total) by mouth daily. 90 capsule 3   OneTouch Delica Lancets 08M MISC 1 each by Does not apply route daily. Use to obtain sample for glucose level Dx Code E11.9 100 each 3   traZODone (DESYREL) 100 MG tablet Take 1.5 tablets (150 mg total) by mouth at bedtime as needed for sleep. 135 tablet 3   [DISCONTINUED] pravastatin (PRAVACHOL) 40 MG tablet TAKE 1 TABLET BY MOUTH DAILY. 90 tablet 3   No current facility-administered medications on file prior to visit.    Allergies  Allergen Reactions   Zolpidem Other (See Comments)    hallucinate   Cephalexin Hives   Keflin [Cephalothin]     rash   Latex Dermatitis    Bandages and adhesives/ poss to latex   Lisinopril Cough    REACTION: cough   Zolpidem Tartrate     UNKNOWN   Lamisil [Terbinafine] Rash    Rash/itchy    Past Medical History:  Diagnosis Date   Allergy    Anxiety    Arthritis    knees and hip  CAD (coronary artery disease)    s/p emergent 4 V CABG (4/10) w 2/4 patent grafts by cath 08/11/09, patent LIMA to LAD & SVG to OM w moderate disease native RCA & occluded grafts to RCA and diagonal   Cataract    Chest pain    secondary to sternal non-union post CABg-now s/p sternotomy w repair 03/28/09 at [facility], dr. 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    Pneumonia 2016   in past    Restless leg syndrome    Schatzki's ring    Type 2 diabetes mellitus, [place] 6[place]   type 2    Past Surgical History:  Procedure Laterality Date   CATARACT EXTRACTION W/PHACO Right 08/18/2016   Procedure: CATARACT EXTRACTION PHACO AND INTRAOCULAR LENS PLACEMENT ([place] Right diabetic;  Surgeon: CLeandrew Koyanagi MD;  [place]  Service: Ophthalmology;  Laterality: Right;  Diabetic-oral meds poss Latex allergy   CATARACT EXTRACTION W/PHACO Left 09/15/2016   Procedure: CATARACT EXTRACTION PHACO AND INTRAOCULAR LENS PLACEMENT ([place]  Left diabetic;  Surgeon: BLeandrew Koyanagi MD;  [place]  Service: Ophthalmology;  Laterality: Left;  Diabetic - oral meds   COLON SURGERY  2014   for perforated colon- diverticulitis   COLONOSCOPY  2003,2010   2010 colon normal- 2003 HPP x 1    CORONARY ANGIOPLASTY  2003   stentx2   CORONARY ARTERY BYPASS GRAFT  08/16/2008   x4   ESOPHAGEAL DILATION  ~2009   Dr MEarlean Shawl  ILEOSTOMY  2014   with reversal-- after diverticulitis. Sigmoid colectomy also. Dr Ely/Bird   INGUINAL HERNIA REPAIR Left x2   Dr HErnst Spell  LEFT HEART CATH AND CORS/GRAFTS ANGIOGRAPHY N/A 09/27/2019   Procedure: LEFT HEART CATH AND CORS/GRAFTS ANGIOGRAPHY;  Surgeon: MBurnell Blanks MD;  [place];  Service: Cardiovascular;  Laterality: N/A;   PILONIDAL CYST EXCISION     pilonidal sinus tract excision     POLYPECTOMY  2007   HPP x 1    STERNUM SEPARATION REPAIR  03/2009   [facility]   TONSILLECTOMY  1984   UPPER GASTROINTESTINAL ENDOSCOPY     VASECTOMY      Family History  Problem Relation Age of Onset   Heart failure Mother    Diabetes Mother    Heart disease Mother    Congestive Heart Failure Mother        cause of death   Cancer Mother        ovarian?   Heart attack Father    Heart attack  Sister    Diabetes Sister    Heart attack Brother    Diabetes Brother    Cancer Brother        Bladder   Heart attack Brother    Diabetes Brother    Bladder Cancer Brother    Cancer Maternal Aunt  died of breast cancer   Breast cancer Maternal Aunt    Cancer Maternal Grandmother        died of bone cancer   Bone cancer Maternal Grandmother    Colon cancer Neg Hx    Colon polyps Neg Hx    Esophageal cancer Neg Hx    Rectal cancer Neg Hx    Stomach cancer Neg Hx     Social History   Socioeconomic History   Marital status: Married    Spouse name: Not on file   Number of children: 3   Years of education: Not on file   Highest education level: Not on file  Occupational History   Occupation: Animator    Comment: Disabled   Occupation: Florist delivery    Comment: part time  Tobacco Use   Smoking status: Former    Packs/day: 0.50    Years: 37.00    Total pack years: 18.50    Types: Cigarettes    Quit date: 07/18/2008    Years since quitting: 13.6   Smokeless tobacco: Never   Tobacco comments:    No smoking since bypass in 4/10  Vaping Use   Vaping Use: Never used  Substance and Sexual Activity   Alcohol use: Yes    Alcohol/week: 0.0 standard drinks of alcohol    Comment: rarely   Drug use: No   Sexual activity: Not on file  Other Topics Concern   Not on file  Social History Narrative   Has living will   Wife is health care POA---alternate is daughter Ria Comment   Would accept resuscitation   Not sure about tube feeds--probably not prolonged   Social Determinants of Health   Financial Resource Strain: Not on file  Food Insecurity: Not on file  Transportation Needs: Not on file  Physical Activity: Not on file  Stress: Not on file  Social Connections: Not on file  Intimate Partner Violence: Not on file   Review of Systems No arm weakness No fever    Objective:   Physical Exam Constitutional:      Appearance: Normal appearance.   Neck:     Comments: ROM normal except restriction of tilt towards the left Pain along right SCM No masses Musculoskeletal:     Comments: Right shoulder normal  Neurological:     Mental Status: He is alert.     Comments: No arm weakness            Assessment & Plan:

## 2022-02-22 NOTE — Assessment & Plan Note (Signed)
Heat and topical diclofenac have helped Discussed that this should be self limited--and get better on its own He should attend to his neck position during sleep Will refill the cyclobenzaprine

## 2022-03-24 ENCOUNTER — Other Ambulatory Visit: Payer: Self-pay | Admitting: Internal Medicine

## 2022-03-25 NOTE — Telephone Encounter (Signed)
Last filled 01-25-22 #30 Last OV 09-18-21 Next OV 04-02-22 [facility]

## 2022-04-02 ENCOUNTER — Ambulatory Visit (INDEPENDENT_AMBULATORY_CARE_PROVIDER_SITE_OTHER): Payer: Medicare Other | Admitting: Internal Medicine

## 2022-04-02 ENCOUNTER — Encounter: Payer: Self-pay | Admitting: Internal Medicine

## 2022-04-02 VITALS — BP 118/70 | HR 63 | Temp 97.9°F | Ht 69.0 in | Wt 159.4 lb

## 2022-04-02 DIAGNOSIS — M170 Bilateral primary osteoarthritis of knee: Secondary | ICD-10-CM | POA: Diagnosis not present

## 2022-04-02 DIAGNOSIS — D696 Thrombocytopenia, unspecified: Secondary | ICD-10-CM

## 2022-04-02 DIAGNOSIS — K219 Gastro-esophageal reflux disease without esophagitis: Secondary | ICD-10-CM

## 2022-04-02 DIAGNOSIS — Z0000 Encounter for general adult medical examination without abnormal findings: Secondary | ICD-10-CM | POA: Diagnosis not present

## 2022-04-02 DIAGNOSIS — E1159 Type 2 diabetes mellitus with other circulatory complications: Secondary | ICD-10-CM | POA: Diagnosis not present

## 2022-04-02 DIAGNOSIS — I25810 Atherosclerosis of coronary artery bypass graft(s) without angina pectoris: Secondary | ICD-10-CM

## 2022-04-02 DIAGNOSIS — Z125 Encounter for screening for malignant neoplasm of prostate: Secondary | ICD-10-CM

## 2022-04-02 DIAGNOSIS — F39 Unspecified mood [affective] disorder: Secondary | ICD-10-CM

## 2022-04-02 DIAGNOSIS — I2510 Atherosclerotic heart disease of native coronary artery without angina pectoris: Secondary | ICD-10-CM

## 2022-04-02 LAB — MICROALBUMIN / CREATININE URINE RATIO
Creatinine,U: 174.3 mg/dL
Microalb Creat Ratio: 3.3 mg/g (ref 0.0–30.0)
Microalb, Ur: 5.8 mg/dL — ABNORMAL HIGH (ref 0.0–1.9)

## 2022-04-02 LAB — LIPID PANEL
Cholesterol: 108 mg/dL (ref 0–200)
HDL: 31.7 mg/dL — ABNORMAL LOW (ref >39.00)
LDL Cholesterol: 56 mg/dL (ref 0–99)
NonHDL: 75.92
Total CHOL/HDL Ratio: 3
Triglycerides: 100 mg/dL (ref 0.0–149.0)
VLDL: 20 mg/dL (ref 0.0–40.0)

## 2022-04-02 LAB — CBC
HCT: 44.4 % (ref 39.0–52.0)
Hemoglobin: 15.6 g/dL (ref 13.0–17.0)
MCHC: 35.1 g/dL (ref 30.0–36.0)
MCV: 90.9 fl (ref 78.0–100.0)
Platelets: 149 10*3/uL — ABNORMAL LOW (ref 150.0–400.0)
RBC: 4.89 Mil/uL (ref 4.22–5.81)
RDW: 13.6 % (ref 11.5–15.5)
WBC: 5 10*3/uL (ref 4.0–10.5)

## 2022-04-02 LAB — HM DIABETES FOOT EXAM

## 2022-04-02 LAB — HEPATIC FUNCTION PANEL
ALT: 42 U/L (ref 0–53)
AST: 27 U/L (ref 0–37)
Albumin: 4.9 g/dL (ref 3.5–5.2)
Alkaline Phosphatase: 53 U/L (ref 39–117)
Bilirubin, Direct: 0.2 mg/dL (ref 0.0–0.3)
Total Bilirubin: 0.9 mg/dL (ref 0.2–1.2)
Total Protein: 7.3 g/dL (ref 6.0–8.3)

## 2022-04-02 LAB — RENAL FUNCTION PANEL
Albumin: 4.9 g/dL (ref 3.5–5.2)
BUN: 17 mg/dL (ref 6–23)
CO2: 27 mEq/L (ref 19–32)
Calcium: 9.8 mg/dL (ref 8.4–10.5)
Chloride: 102 mEq/L (ref 96–112)
Creatinine, Ser: 1.24 mg/dL (ref 0.40–1.50)
GFR: 59.76 mL/min — ABNORMAL LOW (ref >60.00)
Glucose, Bld: 120 mg/dL — ABNORMAL HIGH (ref 70–99)
Phosphorus: 3.4 mg/dL (ref 2.3–4.6)
Potassium: 4.8 mEq/L (ref 3.5–5.1)
Sodium: 138 mEq/L (ref 135–145)

## 2022-04-02 LAB — HEMOGLOBIN A1C: Hgb A1c MFr Bld: 6.2 % (ref 4.6–6.5)

## 2022-04-02 LAB — PSA, MEDICARE: PSA: 1.08 ng/ml (ref 0.10–4.00)

## 2022-04-02 NOTE — Assessment & Plan Note (Signed)
No angina Hasn't needed nitro On ASA 81, atorvastatin 20, metoprolol 50, losartan 50

## 2022-04-02 NOTE — Progress Notes (Signed)
Subjective:    Patient ID: Joseph [name], male    DOB: 08/11/1955, 65 y.o.   MRN: [id number]  HPI Here for Medicare wellness visit and follow up of chronic health conditions Reviewed advanced directives Reviewed other doctors----Dr Brasington--ophthal, Dr McAlhany--cardiology, Dr Genia Harold, Dr Regal--podiatrist, Dr Maryellen Pile No hospitalizations or surgery in the past year Stays active with house/yard work--but no set exercise Vision is okay Hearing is excellent Occasional alcohol No tobacco No falls Feels mood is good Independent with instrumental ADLs No sig memory problems  Neck is better  Has been checking sugars daily Now in 120's to 130's Rarely to 150's Working on eating less carbs now  No recent heart trouble Never used the nitro No chest pain  Gets more DOE---like running on stairs No palpitations  No dizziness or syncope No edema  Uses 1/2 xanax to help with sleep And the trazodone No depression or anhedonia Doesn't feel nervous--but some stress, etc  Mildly low platelets-- 135K Does bruise easily---no abnormal bleeding  Current Outpatient Medications on File Prior to Visit  Medication Sig Dispense Refill   ALPRAZolam (XANAX) 0.5 MG tablet TAKE 1/2 TABLET BY MOUTH AT BEDTIME 30 tablet 0   aspirin 81 MG EC tablet Take 81 mg by mouth daily.      atorvastatin (LIPITOR) 20 MG tablet Take 1 tablet (20 mg total) by mouth daily. 90 tablet 3   cyclobenzaprine (FLEXERIL) 10 MG tablet Take 0.5-1 tablets (5-10 mg total) by mouth 3 (three) times daily as needed for muscle spasms. 20 tablet 1   glucose blood (ONETOUCH VERIO) test strip Use to check blood sugar once a day. Dx Code E11.9 100 each 12   ibuprofen (ADVIL,MOTRIN) 200 MG tablet Take 400 mg by mouth 2 (two) times daily as needed (pain).      Lancet Devices (ONE TOUCH DELICA LANCING DEV) MISC Use to obtain blood sample for glucose Dx Code E11.9 1 each 0   losartan (COZAAR) 50 MG tablet TAKE 1  TABLET BY MOUTH DAILY 90 tablet 3   metFORMIN (GLUCOPHAGE-XR) 750 MG 24 hr tablet TAKE 1 TABLET (750 MG TOTAL) BY MOUTH DAILY WITH BREAKFAST. 90 tablet 3   metoprolol succinate (TOPROL-XL) 50 MG 24 hr tablet Take 1 tablet (50 mg total) by mouth daily. Take with or immediately following a meal. 90 tablet 3   nitroGLYCERIN (NITROSTAT) 0.4 MG SL tablet Place 1 tablet (0.4 mg total) under the tongue every 5 (five) minutes as needed for chest pain. 25 tablet 6   Omega-3 Fatty Acids (FISH OIL) 1000 MG CAPS Take 1,000 mg by mouth daily.     omeprazole (PRILOSEC) 40 MG capsule Take 1 capsule (40 mg total) by mouth daily. 90 capsule 3   OneTouch Delica Lancets 08X MISC 1 each by Does not apply route daily. Use to obtain sample for glucose level Dx Code E11.9 100 each 3   traZODone (DESYREL) 100 MG tablet Take 1.5 tablets (150 mg total) by mouth at bedtime as needed for sleep. 135 tablet 3   [DISCONTINUED] pravastatin (PRAVACHOL) 40 MG tablet TAKE 1 TABLET BY MOUTH DAILY. 90 tablet 3   No current facility-administered medications on file prior to visit.    Allergies  Allergen Reactions   Zolpidem Other (See Comments)    hallucinate   Cephalexin Hives   Keflin [Cephalothin]     rash   Latex Dermatitis    Bandages and adhesives/ poss to latex   Lisinopril Cough    REACTION:  cough   Zolpidem Tartrate     UNKNOWN   Lamisil [Terbinafine] Rash    Rash/itchy    Past Medical History:  Diagnosis Date   Allergy    Anxiety    Arthritis    knees and hip   CAD (coronary artery disease)    s/p emergent 4 V CABG (4/10) w 2/4 patent grafts by cath 08/11/09, patent LIMA to LAD & SVG to OM w moderate disease native RCA & occluded grafts to RCA and diagonal   Cataract    Chest pain    secondary to sternal non-union post CABg-now s/p sternotomy w repair 03/28/09 at [facility], dr. 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 [facility]    Pneumonia 2016   in past    Restless leg syndrome    Schatzki's ring    Type 2 diabetes mellitus, controlled ([place] 6/15   type 2    Past Surgical History:  Procedure Laterality Date   CATARACT EXTRACTION W/PHACO Right 08/18/2016   Procedure: CATARACT EXTRACTION PHACO AND INTRAOCULAR LENS PLACEMENT ([place] Right diabetic;  Surgeon: CLeandrew Koyanagi MD;  [place]  Service: Ophthalmology;  Laterality: Right;  Diabetic-oral meds poss Latex allergy   CATARACT EXTRACTION W/PHACO Left 09/15/2016   Procedure: CATARACT EXTRACTION PHACO AND INTRAOCULAR LENS PLACEMENT ([place]  Left diabetic;  Surgeon: BLeandrew Koyanagi MD;  [place]  Service: Ophthalmology;  Laterality: Left;  Diabetic - oral meds   COLON SURGERY  2014   for perforated colon- diverticulitis   COLONOSCOPY  2003,2010   2010 colon normal- 2003 HPP x 1    CORONARY ANGIOPLASTY  2003   stentx2   CORONARY ARTERY BYPASS GRAFT  08/16/2008   x4   ESOPHAGEAL DILATION  ~2009   Dr MEarlean Shawl  ILEOSTOMY  2014   with reversal-- after diverticulitis. Sigmoid colectomy also. Dr Ely/Bird   INGUINAL HERNIA REPAIR Left x2   Dr HErnst Spell  LEFT HEART CATH AND CORS/GRAFTS ANGIOGRAPHY N/A 09/27/2019   Procedure: LEFT HEART CATH AND CORS/GRAFTS ANGIOGRAPHY;  Surgeon: MBurnell Blanks MD;  [place];  Service: Cardiovascular;  Laterality: N/A;   PILONIDAL CYST EXCISION     pilonidal sinus tract excision     POLYPECTOMY  2007   HPP x 1    STERNUM SEPARATION  REPAIR  03/2009   [facility]   TONSILLECTOMY  1984   UPPER GASTROINTESTINAL ENDOSCOPY     VASECTOMY      Family History  Problem Relation Age of Onset   Heart failure Mother    Diabetes Mother    Heart disease Mother    Congestive Heart Failure Mother        cause of death   Cancer Mother        ovarian?   Heart attack Father    Heart attack Sister    Diabetes Sister    Heart attack  Brother    Diabetes Brother    Cancer Brother        Bladder   Heart attack Brother    Diabetes Brother    Bladder Cancer Brother    Cancer Maternal Aunt        died of breast cancer   Breast cancer Maternal Aunt    Cancer Maternal Grandmother        died of bone cancer   Bone cancer Maternal Grandmother    Colon cancer Neg Hx    Colon polyps Neg Hx    Esophageal cancer Neg Hx    Rectal cancer Neg Hx    Stomach cancer Neg Hx     Social History   Socioeconomic History   Marital status: Married    Spouse name: Not on file   Number of children: 3   Years of education: Not on file   Highest education level: Not on file  Occupational History   Occupation: Animator    Comment: Disabled   Occupation: Florist delivery    Comment: part time  Tobacco Use   Smoking status: Former    Packs/day: 0.50    Years: 37.00    Total pack years: 18.50    Types: Cigarettes    Quit date: 07/18/2008    Years since quitting: 13.7   Smokeless tobacco: Never   Tobacco comments:    No smoking since bypass in 4/10  Vaping Use   Vaping Use: Never used  Substance and Sexual Activity   Alcohol use: Yes    Alcohol/week: 0.0 standard drinks of alcohol    Comment: rarely   Drug use: No   Sexual activity: Not on file  Other Topics Concern   Not on file  Social History Narrative   Has living will   Wife is health care POA---alternate is daughter Ria Comment   Would accept resuscitation   Not sure about tube feeds--probably not prolonged   Social Determinants of Health   Financial  Resource Strain: Not on file  Food Insecurity: Not on file  Transportation Needs: Not on file  Physical Activity: Not on file  Stress: Not on file  Social Connections: Not on file  Intimate Partner Violence: Not on file   Review of Systems Appetite is good Weight is stable Sleeps okay Wears seat belt Teeth okay--overdue for dentist No suspicious skin lesions--mild rosacea General aches and pains--mild knee/hip pains. Some back and shoulder pains. Uses ibuprofen prn. Rare flexeril Bowels move fine. No blood Voids fine--good stream and empties well. Nocturia 0-1    Objective:   Physical Exam Constitutional:      Appearance: Normal appearance.  HENT:     Mouth/Throat:     Comments: No lesions Eyes:     Conjunctiva/sclera: Conjunctivae normal.     Pupils: Pupils are equal, round, and reactive to light.  Cardiovascular:     Rate and Rhythm: Normal rate and regular rhythm.     Pulses: Normal pulses.     Heart sounds: No murmur heard.    No gallop.  Pulmonary:     Effort: Pulmonary effort is normal.     Breath sounds: Normal breath sounds. No wheezing or rales.  Abdominal:     Palpations: Abdomen is soft.     Tenderness: There is no abdominal tenderness.  Musculoskeletal:     Cervical back: Neck supple.     Right lower leg: No edema.     Left lower leg: No edema.  Lymphadenopathy:  Cervical: No cervical adenopathy.  Skin:    Findings: No lesion or rash.     Comments: No foot lesions  Neurological:     General: No focal deficit present.     Mental Status: He is alert and oriented to person, place, and time.     Comments: Mini-cog normal Normal sensation in feet  Psychiatric:        Mood and Affect: Mood normal.        Behavior: Behavior normal.            Assessment & Plan:

## 2022-04-02 NOTE — Assessment & Plan Note (Signed)
Hopefully still good control Would increase the metformin to '1500mg'$  (from 750) if over 7%

## 2022-04-02 NOTE — Assessment & Plan Note (Signed)
Mostly some situational stress  Uses the xanax and trazodone for sleep

## 2022-04-02 NOTE — Assessment & Plan Note (Signed)
Discussed substituting tylenol for ibuprofen unless pain is really worse

## 2022-04-02 NOTE — Assessment & Plan Note (Signed)
Mild  Some bruising but more likely from the aspirin

## 2022-04-02 NOTE — Assessment & Plan Note (Signed)
Symptoms controlled on the daily omeprazole 20 daily

## 2022-04-02 NOTE — Assessment & Plan Note (Signed)
I have personally reviewed the Medicare Annual Wellness questionnaire and have noted 1. The patient's medical and social history 2. Their use of alcohol, tobacco or illicit drugs 3. Their current medications and supplements 4. The patient's functional ability including ADL's, fall risks, home safety risks and hearing or visual             impairment. 5. Diet and physical activities 6. Evidence for depression or mood disorders  The patients weight, height, BMI and visual acuity have been recorded in the chart I have made referrals, counseling and provided education to the patient based review of the above and I have provided the pt with a written personalized care plan for preventive services.  I have provided you with a copy of your personalized plan for preventive services. Please take the time to review along with your updated medication list.  Done with screening colonoscopies Will check PSA Discussed increasing exercise Had flu and updated COVID vaccines Plans shingrix and RSV at pharmacy

## 2022-05-20 ENCOUNTER — Other Ambulatory Visit: Payer: Self-pay | Admitting: Internal Medicine

## 2022-05-20 NOTE — Telephone Encounter (Signed)
Patient called in regarding the status of this rx being sent in

## 2022-05-20 NOTE — Telephone Encounter (Signed)
Last filled 03-25-22 #30 Last OV 04-02-22 Next OV 10-04-22 [facility]

## 2022-05-26 ENCOUNTER — Ambulatory Visit: Payer: Medicare Other | Admitting: Internal Medicine

## 2022-06-25 ENCOUNTER — Telehealth: Payer: Self-pay | Admitting: Internal Medicine

## 2022-06-25 NOTE — Telephone Encounter (Signed)
Patient called in and had some questions regarding his blood sugar. He stated he could be reached at his mobile number. Thank you!

## 2022-06-25 NOTE — Telephone Encounter (Signed)
Pt says his FBS has been higher than normal for him. Usually in 120s-130s. Last few mornings he has been getting 165, 136, 161. 224 today around 1230. Had not had anything to eat or drink since breakfast a few hours before. Drinks water or zero sugar soda.  He used to be on 3 metformin daily. Wonders if he needs to go back on that.   He is going to keep an eye on it this weekend and wait to see what Dr Silvio Pate says to do next week when he returns. Pt will call the office to speak to the Dr on Call over the weekend if his blood sugars stay over 200.

## 2022-06-27 ENCOUNTER — Other Ambulatory Visit: Payer: Self-pay | Admitting: Internal Medicine

## 2022-06-28 NOTE — Telephone Encounter (Signed)
Spoke to pt. He will add an additional metformin.

## 2022-06-29 MED ORDER — METFORMIN HCL ER 750 MG PO TB24: ORAL_TABLET | ORAL | 3 refills | Status: DC

## 2022-06-29 NOTE — Telephone Encounter (Signed)
Pt called asking would [place] refill meds due to the new increase in quantity? Preferred pharmacy is [facility] [id number] - [address], [place]. Call back # [phone]

## 2022-06-29 NOTE — Addendum Note (Signed)
Addended by: Pilar Grammes on: 06/29/2022 12:54 PM   Modules accepted: Orders

## 2022-06-29 NOTE — Telephone Encounter (Signed)
Spoke to pt to let him know I sent in the new rx to [facility].

## 2022-07-01 ENCOUNTER — Other Ambulatory Visit: Payer: Self-pay | Admitting: Internal Medicine

## 2022-07-03 ENCOUNTER — Other Ambulatory Visit: Payer: Self-pay | Admitting: Internal Medicine

## 2022-07-25 ENCOUNTER — Other Ambulatory Visit: Payer: Self-pay | Admitting: Internal Medicine

## 2022-07-26 NOTE — Telephone Encounter (Signed)
Last filled 05-20-22 #30 Last OV 04-02-22 Next OV 10-04-22 [facility]

## 2022-08-25 ENCOUNTER — Telehealth: Payer: Self-pay | Admitting: Internal Medicine

## 2022-08-25 MED ORDER — ONETOUCH DELICA LANCING DEV MISC: 0 refills | Status: DC

## 2022-08-25 MED ORDER — ONETOUCH VERIO VI STRP: ORAL_STRIP | 12 refills | Status: DC

## 2022-08-25 MED ORDER — ONETOUCH DELICA LANCETS 33G MISC: 1.0000 | Freq: Every day | 3 refills | Status: DC

## 2022-08-25 NOTE — Telephone Encounter (Signed)
Rxs sent electronically.  

## 2022-08-25 NOTE — Telephone Encounter (Signed)
Pt called in stated he need new prescription for Glucose monitor, and refill  Prescription Request  08/25/2022  LOV: 04/02/2022  What is the name of the medication or equipment? Lancet Devices (ONE TOUCH DELICA LANCING DEV) MISC  OneTouch Delica Lancets 33G MISC  glucose blood (ONETOUCH VERIO) test strip  Have you contacted your pharmacy to request a refill? Yes   Which pharmacy would you like this sent to?  [facility][id number] [address], [place] - [address] AT [facility] OF [address] [place] [address] Phone: [phone] Fax: [phone]    Patient notified that their request is being sent to the clinical staff for review and that they should receive a response within 2 business days.   Please advise at Mobile [phone] (mobile)

## 2022-08-30 ENCOUNTER — Other Ambulatory Visit: Payer: Self-pay

## 2022-08-30 MED ORDER — BLOOD GLUCOSE MONITORING SUPPL DEVI: 0 refills | Status: DC

## 2022-08-30 NOTE — Telephone Encounter (Signed)
Patient called in and stated that they haven't received the prescription for the Lancet Devices (ONE TOUCH DELICA LANCING DEV) MISC.  Thank you!

## 2022-08-30 NOTE — Telephone Encounter (Signed)
Rx sent electronically.  

## 2022-08-30 NOTE — Telephone Encounter (Signed)
Left message on voicemail for patient to call the office back, Lancets were sent in 08/25/22 #100/3. Need to confirm if there is something else he needs.

## 2022-08-30 NOTE — Addendum Note (Signed)
Addended by: Lonia Blood on: 08/30/2022 10:25 AM   Modules accepted: Orders

## 2022-08-30 NOTE — Telephone Encounter (Signed)
Patient called in and stated that he received these refills but he needs a ONETOUCH VERIO monitor to be sent in. Thank you!

## 2022-08-31 NOTE — Telephone Encounter (Signed)
Patient called back in and stated that he has everything he needs now. Thank you!

## 2022-08-31 NOTE — Telephone Encounter (Signed)
Noted  

## 2022-09-22 ENCOUNTER — Other Ambulatory Visit: Payer: Self-pay | Admitting: Internal Medicine

## 2022-09-22 NOTE — Telephone Encounter (Signed)
Last filled 07-26-22 #30 Last OV 04-02-22 Next OV 10-04-22 [facility]

## 2022-10-03 ENCOUNTER — Other Ambulatory Visit: Payer: Self-pay | Admitting: Cardiovascular Disease

## 2022-10-04 ENCOUNTER — Encounter: Payer: Self-pay | Admitting: Internal Medicine

## 2022-10-04 ENCOUNTER — Ambulatory Visit (INDEPENDENT_AMBULATORY_CARE_PROVIDER_SITE_OTHER): Payer: Medicare Other | Admitting: Internal Medicine

## 2022-10-04 VITALS — BP 126/62 | HR 62 | Temp 97.7°F | Ht 69.0 in | Wt 158.0 lb

## 2022-10-04 DIAGNOSIS — D696 Thrombocytopenia, unspecified: Secondary | ICD-10-CM

## 2022-10-04 DIAGNOSIS — I10 Essential (primary) hypertension: Secondary | ICD-10-CM | POA: Diagnosis not present

## 2022-10-04 DIAGNOSIS — E1159 Type 2 diabetes mellitus with other circulatory complications: Secondary | ICD-10-CM

## 2022-10-04 DIAGNOSIS — I2510 Atherosclerotic heart disease of native coronary artery without angina pectoris: Secondary | ICD-10-CM | POA: Diagnosis not present

## 2022-10-04 DIAGNOSIS — Z7984 Long term (current) use of oral hypoglycemic drugs: Secondary | ICD-10-CM

## 2022-10-04 DIAGNOSIS — N1831 Chronic kidney disease, stage 3a: Secondary | ICD-10-CM | POA: Diagnosis not present

## 2022-10-04 LAB — POCT GLYCOSYLATED HEMOGLOBIN (HGB A1C): Hemoglobin A1C: 5.8 % — AB (ref 4.0–5.6)

## 2022-10-04 NOTE — Assessment & Plan Note (Signed)
Borderline Easy bruising due to aspirin

## 2022-10-04 NOTE — Assessment & Plan Note (Signed)
BP Readings from Last 3 Encounters:  10/04/22 126/62  04/02/22 118/70  02/22/22 130/84   Controlled on the losartan 50g and metoprolol 50 daily

## 2022-10-04 NOTE — Progress Notes (Signed)
Subjective:    Patient ID: Joseph [name], male    DOB: 1956-03-02, 67 y.o.   MRN: [id number]  HPI Here for follow up of diabetes and other chronic health conditions  Doing well Some stress with wife falling and having wrist/elbow fracture He is now doing all the housework, etc  Sugars had gone up slightly---- as much as 150-200 in the morning Increase metformin to 1500mg  again  Now 105-125 Does get mild/rare low sugar reactions---will just eat a granola bar then No numbness/burning in feet  Borderline GFR at 59 last time  Easy bruising No abnormal bleeding  Current Outpatient Medications on File Prior to Visit  Medication Sig Dispense Refill   ALPRAZolam (XANAX) 0.5 MG tablet TAKE 1/2 TABLET BY MOUTH EVERY NIGHT AT BEDTIME 30 tablet 1   aspirin 81 MG EC tablet Take 81 mg by mouth daily.      atorvastatin (LIPITOR) 20 MG tablet TAKE ONE TABLET BY MOUTH DAILY 90 tablet 3   Blood Glucose Monitoring Suppl DEVI One touch Verio monitor Dx code: E11.59 1 each 0   cyclobenzaprine (FLEXERIL) 10 MG tablet Take 0.5-1 tablets (5-10 mg total) by mouth 3 (three) times daily as needed for muscle spasms. 20 tablet 1   glucose blood (ONETOUCH VERIO) test strip Use to check blood sugar once a day. Dx Code E11.9 100 each 12   ibuprofen (ADVIL,MOTRIN) 200 MG tablet Take 400 mg by mouth 2 (two) times daily as needed (pain).      Lancet Devices (ONE TOUCH DELICA LANCING DEV) MISC Use to obtain blood sample for glucose Dx Code E11.9 1 each 0   losartan (COZAAR) 50 MG tablet TAKE 1 TABLET BY MOUTH DAILY 90 tablet 3   metFORMIN (GLUCOPHAGE-XR) 750 MG 24 hr tablet TAKE 2 TABLET (750 MG TOTAL) BY MOUTH DAILY WITH BREAKFAST. 180 tablet 3   metoprolol succinate (TOPROL-XL) 50 MG 24 hr tablet TAKE ONE TABLET BY MOUTH DAILY WITH OR IMMEDIATELY FOLLOWING A MEAL 90 tablet 3   nitroGLYCERIN (NITROSTAT) 0.4 MG SL tablet Place 1 tablet (0.4 mg total) under the tongue every 5 (five) minutes as needed for chest  pain. 25 tablet 6   Omega-3 Fatty Acids (FISH OIL) 1000 MG CAPS Take 1,000 mg by mouth daily.     omeprazole (PRILOSEC) 40 MG capsule TAKE ONE CAPSULE BY MOUTH DAILY 90 capsule 3   OneTouch Delica Lancets 33G MISC 1 each by Does not apply route daily. Use to obtain sample for glucose level Dx Code E11.9 100 each 3   traZODone (DESYREL) 100 MG tablet TAKE 1 AND 1/2 TABLET BY MOUTH EVERY NIGHT AT BEDTIME AS NEEDED FOR SLEEP 135 tablet 3   [DISCONTINUED] pravastatin (PRAVACHOL) 40 MG tablet TAKE 1 TABLET BY MOUTH DAILY. 90 tablet 3   No current facility-administered medications on file prior to visit.    Allergies  Allergen Reactions   Zolpidem Other (See Comments)    hallucinate   Cephalexin Hives   Keflin [Cephalothin]     rash   Latex Dermatitis    Bandages and adhesives/ poss to latex   Lisinopril Cough    REACTION: cough   Zolpidem Tartrate     UNKNOWN   Lamisil [Terbinafine] Rash    Rash/itchy    Past Medical History:  Diagnosis Date   Allergy    Anxiety    Arthritis    knees and hip   CAD (coronary artery disease)    s/p emergent 4 V CABG (  4/10) w 2/4 patent grafts by cath 08/11/09, patent LIMA to LAD & SVG to OM w moderate disease native RCA & occluded grafts to RCA and diagonal   Cataract    Chest pain    secondary to sternal non-union post CABg-now s/p sternotomy w repair 03/28/09 at [facility], dr. 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 Repeat at [facility] w reported ef of 40%.    Pneumonia 2016   in past     Restless leg syndrome    Schatzki's ring    Type 2 diabetes mellitus, controlled (HCC) 6/15   type 2    Past Surgical History:  Procedure Laterality Date   CATARACT EXTRACTION W/PHACO Right 08/18/2016   Procedure: CATARACT EXTRACTION PHACO AND INTRAOCULAR LENS PLACEMENT (IOC) Right diabetic;  Surgeon: Lockie Mola, MD;  Location: [facility];  Service: Ophthalmology;  Laterality: Right;  Diabetic-oral meds poss Latex allergy   CATARACT EXTRACTION W/PHACO Left 09/15/2016   Procedure: CATARACT EXTRACTION PHACO AND INTRAOCULAR LENS PLACEMENT (IOC)  Left diabetic;  Surgeon: Lockie Mola, MD;  Location: [facility];  Service: Ophthalmology;  Laterality: Left;  Diabetic - oral meds   COLON SURGERY  2014   for perforated colon- diverticulitis   COLONOSCOPY  2003,2010   2010 colon normal- 2003 HPP x 1    CORONARY ANGIOPLASTY  2003   stentx2   CORONARY ARTERY BYPASS GRAFT  08/16/2008   x4   ESOPHAGEAL DILATION  ~2009   Dr Kinnie Scales   ILEOSTOMY  2014   with reversal-- after diverticulitis. Sigmoid colectomy also. Dr Ely/Bird   INGUINAL HERNIA REPAIR Left x2   Dr Orson Slick   LEFT HEART CATH AND CORS/GRAFTS ANGIOGRAPHY N/A 09/27/2019   Procedure: LEFT HEART CATH AND CORS/GRAFTS ANGIOGRAPHY;  Surgeon: Kathleene Hazel, MD;  Location: [facility];  Service: Cardiovascular;  Laterality: N/A;   PILONIDAL CYST EXCISION     pilonidal sinus tract excision     POLYPECTOMY  2007   HPP x 1    STERNUM SEPARATION REPAIR  03/2009   [facility]   TONSILLECTOMY  1984   UPPER GASTROINTESTINAL ENDOSCOPY     VASECTOMY      Family History  Problem Relation Age of Onset   Heart failure Mother    Diabetes Mother    Heart disease Mother    Congestive Heart Failure Mother        cause of death   Cancer Mother        ovarian?   Heart attack Father    Heart attack Sister    Diabetes Sister    Heart attack Brother    Diabetes Brother    Cancer Brother        Bladder    Heart attack Brother    Diabetes Brother    Bladder Cancer Brother    Cancer Maternal Aunt        died of breast cancer   Breast cancer Maternal Aunt  Cancer Maternal Grandmother        died of bone cancer   Bone cancer Maternal Grandmother    Colon cancer Neg Hx    Colon polyps Neg Hx    Esophageal cancer Neg Hx    Rectal cancer Neg Hx    Stomach cancer Neg Hx     Social History   Socioeconomic History   Marital status: Married    Spouse name: Not on file   Number of children: 3   Years of education: Not on file   Highest education level: Not on file  Occupational History   Occupation: Database administrator    Comment: Disabled   Occupation: Florist delivery    Comment: part time  Tobacco Use   Smoking status: Former    Packs/day: 0.50    Years: 37.00    Additional pack years: 0.00    Total pack years: 18.50    Types: Cigarettes    Quit date: 07/18/2008    Years since quitting: 14.2   Smokeless tobacco: Never   Tobacco comments:    No smoking since bypass in 4/10  Vaping Use   Vaping Use: Never used  Substance and Sexual Activity   Alcohol use: Yes    Alcohol/week: 0.0 standard drinks of alcohol    Comment: rarely   Drug use: No   Sexual activity: Not on file  Other Topics Concern   Not on file  Social History Narrative   Has living will   Wife is health care POA---alternate is daughter Lillia Abed   Would accept resuscitation   Not sure about tube feeds--probably not prolonged   Social Determinants of Health   Financial Resource Strain: Not on file  Food Insecurity: Not on file  Transportation Needs: Not on file  Physical Activity: Not on file  Stress: Not on file  Social Connections: Not on file  Intimate Partner Violence: Not on file   Review of Systems Sleeps well Appetite is fine Weight is stable     Objective:   Physical Exam Constitutional:      Appearance: Normal appearance.  Cardiovascular:     Rate and Rhythm: Normal rate  and regular rhythm.     Pulses: Normal pulses.     Heart sounds: No murmur heard.    No gallop.  Pulmonary:     Effort: Pulmonary effort is normal.     Breath sounds: Normal breath sounds. No wheezing or rales.  Musculoskeletal:     Cervical back: Neck supple.     Right lower leg: No edema.     Left lower leg: No edema.  Lymphadenopathy:     Cervical: No cervical adenopathy.  Skin:    Comments: No foot lesions  Neurological:     Mental Status: He is alert.  Psychiatric:        Mood and Affect: Mood normal.        Behavior: Behavior normal.            Assessment & Plan:

## 2022-10-04 NOTE — Assessment & Plan Note (Signed)
No symptoms---has never taken the nitroglycerin Seeing Dr Clifton James soon

## 2022-10-04 NOTE — Assessment & Plan Note (Signed)
Lab Results  Component Value Date   HGBA1C 5.8 (A) 10/04/2022   Excellent control on the metformin 1500mg   On BP/heart meds

## 2022-10-04 NOTE — Assessment & Plan Note (Signed)
Borderline low GFR Is on the losartan Will recheck next time

## 2022-10-26 NOTE — Progress Notes (Unsigned)
No chief complaint on file.  History of Present Illness: 67 yo male with history of CAD s/p 4V CABG, DM, GERD and nephrolithiasis who is here today for cardiac follow up. He was admitted to [facility] in April 2010 with an anterior STEMI. Emergent cath demonstrated a patent prior stent in the ostial LAD extending into the mid LAD. There was a hazy 99% stenosis in the mid LAD that was felt to be the culprit. PCI failed after multiple attempts, followed by ventricular fibrillation and the pt was taken emergently to the operating room for bypass. Emergent CABG was performed by Dr. Dorris Fetch with 4 vessels bypassed (LIMA to LAD, SVG to D2, SVG to OM2, SVG to distal RCA). After surgery, he had problems with chest wall pain and went to [facility] for evaluation by CT surgery. He underwent sternotomy with removal of sternal wires and revision by Dr. Ty Hilts on 03/28/09. He was admitted to [facility] April 2011 with chest pain. Cardiac cath repeated and showed patent LIMA to LAD, patent SVG to OM with occluded SVG to Diagonal and occluded SVG to RCA. EF was 45%. He did well over the next ten years. I saw him April 2021 and he c/o resting chest pain. Exercise stress test May 2021 with exercise induced polymorphic VT. He was seen by Dr. Graciela Husbands who recommended cardiac cath and restarting his beta blocker. Cardiac cath May 2021 with 2/4 patent bypass grafts, chronic occlusion of the mid LAD and moderate proximal RCA stenosis that was unchanged from last cath. The vein graft to the RCA is known to be occluded. Echo May 2021 with LVEF=55-60%. No valve disease.   He is here today for follow up. The patient denies any chest pain, dyspnea, palpitations, lower extremity edema, orthopnea, PND, dizziness, near syncope or syncope.    Primary Care Physician: Karie Schwalbe, MD  Past Medical History:  Diagnosis Date   Allergy    Anxiety    Arthritis    knees and hip   CAD (coronary artery disease)    s/p emergent 4  V CABG (4/10) w 2/4 patent grafts by cath 08/11/09, patent LIMA to LAD & SVG to OM w moderate disease native RCA & occluded grafts to RCA and diagonal   Cataract    Chest pain    secondary to sternal non-union post CABg-now s/p sternotomy w repair 03/28/09 at [facility], dr. 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 Repeat at [facility] w reported ef of 40%.    Pneumonia 2016   in past    Restless leg syndrome    Schatzki's ring    Type 2 diabetes mellitus, controlled (HCC) 6/15   type 2    Past Surgical History:  Procedure Laterality Date   CATARACT EXTRACTION W/PHACO Right 08/18/2016   Procedure: CATARACT EXTRACTION PHACO AND INTRAOCULAR LENS PLACEMENT (IOC) Right diabetic;  Surgeon: Lockie Mola, MD;  Location: [facility];  Service: Ophthalmology;  Laterality: Right;  Diabetic-oral meds poss Latex allergy   CATARACT EXTRACTION W/PHACO Left 09/15/2016   Procedure: CATARACT EXTRACTION PHACO AND INTRAOCULAR LENS PLACEMENT (IOC)  Left diabetic;  Surgeon: Lockie Mola, MD;  Location: [facility];  Service: Ophthalmology;  Laterality: Left;  Diabetic - oral meds   COLON SURGERY  2014   for perforated colon- diverticulitis   COLONOSCOPY  2003,2010   2010 colon normal- 2003 HPP x 1    CORONARY ANGIOPLASTY  2003   stentx2   CORONARY ARTERY BYPASS GRAFT  08/16/2008   x4   ESOPHAGEAL DILATION  ~2009   Dr Kinnie Scales   ILEOSTOMY  2014   with reversal-- after diverticulitis. Sigmoid colectomy also. Dr Ely/Bird    INGUINAL HERNIA REPAIR Left x2   Dr Orson Slick   LEFT HEART CATH AND CORS/GRAFTS ANGIOGRAPHY N/A 09/27/2019   Procedure: LEFT HEART CATH AND CORS/GRAFTS ANGIOGRAPHY;  Surgeon: Kathleene Hazel, MD;  Location: [facility];  Service: Cardiovascular;  Laterality: N/A;   PILONIDAL CYST EXCISION     pilonidal sinus tract excision     POLYPECTOMY  2007   HPP x 1    STERNUM SEPARATION REPAIR  03/2009   [facility]   TONSILLECTOMY  1984   UPPER GASTROINTESTINAL ENDOSCOPY     VASECTOMY      Current Outpatient Medications  Medication Sig Dispense Refill   ALPRAZolam (XANAX) 0.5 MG tablet TAKE 1/2 TABLET BY MOUTH EVERY NIGHT AT BEDTIME 30 tablet 1   aspirin 81 MG EC tablet Take 81 mg by mouth daily.      atorvastatin (LIPITOR) 20 MG tablet TAKE ONE TABLET BY MOUTH DAILY 90 tablet 3   Blood Glucose Monitoring Suppl DEVI One touch Verio monitor Dx code: E11.59 1 each 0   cyclobenzaprine (FLEXERIL) 10 MG tablet Take 0.5-1 tablets (5-10 mg total) by mouth 3 (three) times daily as needed for muscle spasms. 20 tablet 1   glucose blood (ONETOUCH VERIO) test strip Use to check blood sugar once a day. Dx Code E11.9 100 each 12   ibuprofen (ADVIL,MOTRIN) 200 MG tablet Take 400 mg by mouth 2 (two) times daily as needed (pain).      Lancet Devices (ONE TOUCH DELICA LANCING DEV) MISC Use to obtain blood sample for glucose Dx Code E11.9 1 each 0   losartan (COZAAR) 50 MG tablet TAKE 1 TABLET BY MOUTH DAILY 90 tablet 3   metFORMIN (GLUCOPHAGE-XR) 750 MG 24 hr tablet TAKE 2 TABLET (750 MG TOTAL) BY MOUTH DAILY WITH BREAKFAST. 180 tablet 3   metoprolol succinate (TOPROL-XL) 50 MG 24 hr tablet TAKE ONE TABLET BY MOUTH DAILY WITH OR IMMEDIATELY FOLLOWING A MEAL 90 tablet 3   nitroGLYCERIN (NITROSTAT) 0.4 MG SL tablet Place 1 tablet (0.4 mg total) under the tongue every 5 (five) minutes as needed for chest pain. 25 tablet 6   Omega-3 Fatty Acids (FISH OIL) 1000 MG CAPS Take 1,000 mg by mouth daily.      omeprazole (PRILOSEC) 40 MG capsule TAKE ONE CAPSULE BY MOUTH DAILY 90 capsule 3   OneTouch Delica Lancets 33G MISC 1 each by Does not apply route daily. Use to obtain sample for glucose level Dx Code E11.9 100 each 3   traZODone (DESYREL) 100 MG tablet TAKE 1 AND 1/2 TABLET BY MOUTH EVERY NIGHT AT BEDTIME AS NEEDED FOR SLEEP 135 tablet 3   No current facility-administered medications for this visit.    Allergies  Allergen Reactions   Zolpidem Other (See Comments)  hallucinate   Cephalexin Hives   Keflin [Cephalothin]     rash   Latex Dermatitis    Bandages and adhesives/ poss to latex   Lisinopril Cough    REACTION: cough   Zolpidem Tartrate     UNKNOWN   Lamisil [Terbinafine] Rash    Rash/itchy    History   Social History   Marital Status: Married    Spouse Name: N/A    Number of Children: 3   Years of Education: N/A   Occupational History   Not on file.   Social History Main Topics   Smoking status: Former Smoker    Quit date: 07/18/2008   Smokeless tobacco: Not on file     Comment: No smoking since bypass in 4/10   Alcohol Use: No        Drug Use: No   Sexually Active: Not on file   Other Topics Concern   Not on file   Social History Narrative       Family History  Problem Relation Age of Onset   Heart failure Mother    Diabetes Mother    Heart disease Mother    Congestive Heart Failure Mother        cause of death   Cancer Mother        ovarian?   Heart attack Father    Heart attack Sister    Diabetes Sister    Heart attack Brother    Diabetes Brother    Cancer Brother        Bladder   Heart attack Brother    Diabetes Brother    Bladder Cancer Brother    Cancer Maternal Aunt        died of breast cancer   Breast cancer Maternal Aunt    Cancer Maternal Grandmother        died of bone cancer   Bone cancer Maternal Grandmother    Colon cancer Neg Hx    Colon polyps Neg Hx    Esophageal cancer Neg Hx    Rectal cancer Neg Hx     Stomach cancer Neg Hx     Review of Systems:  As stated in the HPI and otherwise negative.   There were no vitals taken for this visit.  Physical Examination:  General: Well developed, well nourished, NAD  HEENT: OP clear, mucus membranes moist  SKIN: warm, dry. No rashes. Neuro: No focal deficits  Musculoskeletal: Muscle strength 5/5 all ext  Psychiatric: Mood and affect normal  Neck: No JVD, no carotid bruits, no thyromegaly, no lymphadenopathy.  Lungs:Clear bilaterally, no wheezes, rhonci, crackles Cardiovascular: Regular rate and rhythm. No murmurs, gallops or rubs. Abdomen:Soft. Bowel sounds present. Non-tender.  Extremities: No lower extremity edema. Pulses are 2 + in the bilateral DP/PT.  EKG:  EKG is *** ordered today. The ekg ordered today demonstrates   Echo May 2021:  1. Left ventricular ejection fraction, by estimation, is 55 to 60%. The  left ventricle has normal function. The left ventricle has no regional  wall motion abnormalities. There is mild concentric left ventricular  hypertrophy. Left ventricular diastolic  parameters are consistent with Grade I diastolic dysfunction (impaired  relaxation).   2. Right ventricular systolic function is normal. The right ventricular  size is normal. There is normal pulmonary artery systolic pressure.   3. The mitral valve is normal in structure. No evidence of mitral valve  regurgitation. No evidence of mitral stenosis.   4. The aortic valve is  tricuspid. Aortic valve regurgitation is not  visualized. No aortic stenosis is present.   5. The inferior vena cava is normal in size with greater than 50%  respiratory variability, suggesting right atrial pressure of 3 mmHg.   Recent Labs: 04/02/2022: ALT 42; BUN 17; Creatinine, Ser 1.24; Hemoglobin 15.6; Platelets 149.0; Potassium 4.8; Sodium 138   Lipid Panel    Component Value Date/Time   CHOL 108 04/02/2022 1019   CHOL 92 09/14/2012 1521   TRIG 100.0 04/02/2022 1019    TRIG 230 (H) 09/14/2012 1521   HDL 31.70 (L) 04/02/2022 1019   HDL 22 (L) 09/14/2012 1521   CHOLHDL 3 04/02/2022 1019   VLDL 20.0 04/02/2022 1019   VLDL 46 (H) 09/14/2012 1521   LDLCALC 56 04/02/2022 1019   LDLCALC 24 09/14/2012 1521   LDLDIRECT 67.0 02/23/2019 1228     Wt Readings from Last 3 Encounters:  10/04/22 71.7 kg  04/02/22 72.3 kg  02/22/22 73 kg    Assessment and Plan:   1. CAD s/p CABG without angina: No chest pain suggestive of angina. CAD stable by cath in May 2021. Continue ASA, statin and beta blocker  2. Tobacco abuse, in remission: No tobacco use since 2010.   3. HTN: BP is controlled. No changes  4. Non-sustained ventricular tachycardia: No palpitations. LV function normal by echo May 2021. Continue beta blocker  Labs/ tests ordered today include:  No orders of the defined types were placed in this encounter.  Disposition:   F/U with me in 12 months  Signed, Verne Carrow, MD 10/26/2022 12:06 PM    [facility] [address] [place], [place], [place]  [address] Phone: [phone]; Fax: [phone]

## 2022-10-27 ENCOUNTER — Ambulatory Visit: Payer: Medicare Other | Attending: Cardiovascular Disease | Admitting: Cardiovascular Disease

## 2022-10-27 ENCOUNTER — Encounter: Payer: Self-pay | Admitting: Cardiovascular Disease

## 2022-10-27 VITALS — BP 162/88 | HR 64 | Ht 69.0 in | Wt 159.6 lb

## 2022-10-27 DIAGNOSIS — I10 Essential (primary) hypertension: Secondary | ICD-10-CM | POA: Diagnosis not present

## 2022-10-27 DIAGNOSIS — I472 Ventricular tachycardia, unspecified: Secondary | ICD-10-CM | POA: Diagnosis not present

## 2022-10-27 DIAGNOSIS — I25810 Atherosclerosis of coronary artery bypass graft(s) without angina pectoris: Secondary | ICD-10-CM | POA: Insufficient documentation

## 2022-10-27 MED ORDER — NITROGLYCERIN 0.4 MG SL SUBL: 0.4000 mg | SUBLINGUAL_TABLET | SUBLINGUAL | 6 refills | Status: AC | PRN

## 2022-10-27 NOTE — Patient Instructions (Signed)
Medication Instructions:  No changes *If you need a refill on your cardiac medications before your next appointment, please call your pharmacy*   Lab Work: none If you have labs (blood work) drawn today and your tests are completely normal, you will receive your results only by: MyChart Message (if you have MyChart) OR A paper copy in the mail If you have any lab test that is abnormal or we need to change your treatment, we will call you to review the results.   Testing/Procedures: none   Follow-Up: At [facility], you and your health needs are our priority.  As part of our continuing mission to provide you with exceptional heart care, we have created designated Provider Care Teams.  These Care Teams include your primary Cardiologist (physician) and Advanced Practice Providers (APPs -  Physician Assistants and Nurse Practitioners) who all work together to provide you with the care you need, when you need it.   Your next appointment:   12 month(s)  Provider:   Christopher McAlhany, MD      

## 2022-11-30 ENCOUNTER — Ambulatory Visit: Payer: Medicare Other | Admitting: Cardiovascular Disease

## 2022-12-08 ENCOUNTER — Ambulatory Visit (INDEPENDENT_AMBULATORY_CARE_PROVIDER_SITE_OTHER): Payer: Medicare Other | Admitting: Internal Medicine

## 2022-12-08 ENCOUNTER — Encounter: Payer: Self-pay | Admitting: Internal Medicine

## 2022-12-08 VITALS — BP 118/80 | HR 69 | Temp 97.5°F | Ht 69.0 in | Wt 159.0 lb

## 2022-12-08 DIAGNOSIS — S161XXA Strain of muscle, fascia and tendon at neck level, initial encounter: Secondary | ICD-10-CM

## 2022-12-08 MED ORDER — TIZANIDINE HCL 2 MG PO TABS: 2.0000 mg | ORAL_TABLET | Freq: Three times a day (TID) | ORAL | 1 refills | Status: DC | PRN

## 2022-12-08 NOTE — Assessment & Plan Note (Signed)
Clearly seems muscular Discussed microwavable heat wrap Diclofenac topical Save ibuprofen for if more severe Tizanidine 2- 4 tid prn  Calf seems benign--reassured---no DVT

## 2022-12-08 NOTE — Progress Notes (Signed)
Subjective:    Patient ID: Joseph [name], male    DOB: 06/05/1955, 67 y.o.   MRN: [id number]  HPI Here with wife due to neck and leg pain  Right neck pain--and into shoulder He thinks it comes from having to help wife with transfers, etc--since she fell and had hand/elbow injuries starting in April Frustrated that it just won't go away Ibuprofen helps--400mg  usually (once or twice a day----more lately) Diclofenac gel helps a little Had left over cyclobenzaprine---helped but it made him groggy Heating pad helps Hasn't limited his activities No radiation to arms or arm weakness   About a week ago--started with calf pain from knee to ankle Dull ache today Usually middle of shin and outside No swelling  Current Outpatient Medications on File Prior to Visit  Medication Sig Dispense Refill   ALPRAZolam (XANAX) 0.5 MG tablet TAKE 1/2 TABLET BY MOUTH EVERY NIGHT AT BEDTIME 30 tablet 1   aspirin 81 MG EC tablet Take 81 mg by mouth daily.      atorvastatin (LIPITOR) 20 MG tablet TAKE ONE TABLET BY MOUTH DAILY 90 tablet 3   Blood Glucose Monitoring Suppl DEVI One touch Verio monitor Dx code: E11.59 1 each 0   cyclobenzaprine (FLEXERIL) 10 MG tablet Take 0.5-1 tablets (5-10 mg total) by mouth 3 (three) times daily as needed for muscle spasms. 20 tablet 1   glucose blood (ONETOUCH VERIO) test strip Use to check blood sugar once a day. Dx Code E11.9 100 each 12   ibuprofen (ADVIL,MOTRIN) 200 MG tablet Take 400 mg by mouth 2 (two) times daily as needed (pain).      Lancet Devices (ONE TOUCH DELICA LANCING DEV) MISC Use to obtain blood sample for glucose Dx Code E11.9 1 each 0   losartan (COZAAR) 50 MG tablet TAKE 1 TABLET BY MOUTH DAILY 90 tablet 3   metFORMIN (GLUCOPHAGE-XR) 750 MG 24 hr tablet TAKE 2 TABLET (750 MG TOTAL) BY MOUTH DAILY WITH BREAKFAST. 180 tablet 3   metoprolol succinate (TOPROL-XL) 50 MG 24 hr tablet TAKE ONE TABLET BY MOUTH DAILY WITH OR IMMEDIATELY FOLLOWING A MEAL 90  tablet 3   nitroGLYCERIN (NITROSTAT) 0.4 MG SL tablet Place 1 tablet (0.4 mg total) under the tongue every 5 (five) minutes as needed for chest pain. 25 tablet 6   Omega-3 Fatty Acids (FISH OIL) 1000 MG CAPS Take 1,000 mg by mouth daily.     omeprazole (PRILOSEC) 40 MG capsule TAKE ONE CAPSULE BY MOUTH DAILY 90 capsule 3   OneTouch Delica Lancets 33G MISC 1 each by Does not apply route daily. Use to obtain sample for glucose level Dx Code E11.9 100 each 3   traZODone (DESYREL) 100 MG tablet TAKE 1 AND 1/2 TABLET BY MOUTH EVERY NIGHT AT BEDTIME AS NEEDED FOR SLEEP 135 tablet 3   [DISCONTINUED] pravastatin (PRAVACHOL) 40 MG tablet TAKE 1 TABLET BY MOUTH DAILY. 90 tablet 3   No current facility-administered medications on file prior to visit.    Allergies  Allergen Reactions   Zolpidem Other (See Comments)    hallucinate   Cephalexin Hives   Keflin [Cephalothin]     rash   Latex Dermatitis    Bandages and adhesives/ poss to latex   Lisinopril Cough    REACTION: cough   Zolpidem Tartrate     UNKNOWN   Lamisil [Terbinafine] Rash    Rash/itchy    Past Medical History:  Diagnosis Date   Allergy    Anxiety  Arthritis    knees and hip   CAD (coronary artery disease)    s/p emergent 4 V CABG (4/10) w 2/4 patent grafts by cath 08/11/09, patent LIMA to LAD & SVG to OM w moderate disease native RCA & occluded grafts to RCA and diagonal   Cataract    Chest pain    secondary to sternal non-union post CABg-now s/p sternotomy w repair 03/28/09 at [facility], dr. 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 Repeat at [facility] w reported ef of 40%.    Pneumonia 2016   in past    Restless leg syndrome    Schatzki's ring    Type 2 diabetes mellitus, controlled (HCC) 6/15   type 2    Past Surgical History:  Procedure Laterality Date   CATARACT EXTRACTION W/PHACO Right 08/18/2016   Procedure: CATARACT EXTRACTION PHACO AND INTRAOCULAR LENS PLACEMENT (IOC) Right diabetic;  Surgeon: Lockie Mola, MD;  Location: [facility];  Service: Ophthalmology;  Laterality: Right;  Diabetic-oral meds poss Latex allergy   CATARACT EXTRACTION W/PHACO Left 09/15/2016   Procedure: CATARACT EXTRACTION PHACO AND INTRAOCULAR LENS PLACEMENT (IOC)  Left diabetic;  Surgeon: Lockie Mola, MD;  Location: [facility];  Service: Ophthalmology;  Laterality: Left;  Diabetic - oral meds   COLON SURGERY  2014   for perforated colon- diverticulitis   COLONOSCOPY  2003,2010   2010 colon normal- 2003 HPP x 1    CORONARY ANGIOPLASTY  2003   stentx2   CORONARY ARTERY BYPASS GRAFT  08/16/2008   x4   ESOPHAGEAL DILATION  ~2009   Dr Kinnie Scales   ILEOSTOMY  2014   with reversal-- after diverticulitis. Sigmoid colectomy also. Dr Ely/Bird   INGUINAL HERNIA REPAIR Left x2   Dr Orson Slick   LEFT HEART CATH AND CORS/GRAFTS ANGIOGRAPHY N/A 09/27/2019   Procedure: LEFT HEART CATH AND CORS/GRAFTS ANGIOGRAPHY;  Surgeon: Kathleene Hazel, MD;  [facility];  Service: Cardiovascular;  Laterality: N/A;   PILONIDAL CYST EXCISION     pilonidal sinus tract excision     POLYPECTOMY  2007   HPP x 1    STERNUM SEPARATION REPAIR  03/2009   [facility]   TONSILLECTOMY  1984   UPPER GASTROINTESTINAL ENDOSCOPY     VASECTOMY      Family History  Problem Relation Age of Onset   Heart failure Mother    Diabetes Mother    Heart disease Mother    Congestive Heart Failure Mother        cause of death   Cancer Mother        ovarian?   Heart  attack Father    Heart attack Sister    Diabetes Sister    Heart attack Brother    Diabetes Brother    Cancer Brother        Bladder   Heart attack Brother    Diabetes Brother    Bladder Cancer Brother  Cancer Maternal Aunt        died of breast cancer   Breast cancer Maternal Aunt    Cancer Maternal Grandmother        died of bone cancer   Bone cancer Maternal Grandmother    Colon cancer Neg Hx    Colon polyps Neg Hx    Esophageal cancer Neg Hx    Rectal cancer Neg Hx    Stomach cancer Neg Hx     Social History   Socioeconomic History   Marital status: Married    Spouse name: Not on file   Number of children: 3   Years of education: Not on file   Highest education level: Some college, no degree  Occupational History   Occupation: Database administrator    Comment: Disabled   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  Tobacco Use   Smoking status: Former    Current packs/day: 0.00    Average packs/day: 0.5 packs/day for 37.0 years (18.5 ttl pk-yrs)    Types: Cigarettes    Start date: 07/19/1971    Quit date: 07/18/2008    Years since quitting: 14.4   Smokeless tobacco: Never   Tobacco comments:    No smoking since bypass in 4/10  Vaping Use   Vaping status: Never Used  Substance and Sexual Activity   Alcohol use: Yes    Alcohol/week: 0.0 standard drinks of alcohol    Comment: rarely   Drug use: No   Sexual activity: Not on file  Other Topics Concern   Not on file  Social History Narrative   Has living will   Wife is health care POA---alternate is daughter Lillia Abed   Would accept resuscitation   Not sure about tube feeds--probably not prolonged   Social Determinants of Health   Financial Resource Strain: Low Risk  (12/06/2022)   Overall Financial Resource Strain (CARDIA)    Difficulty of Paying Living Expenses: Not very hard  Food Insecurity: No Food Insecurity (12/06/2022)   Hunger Vital Sign    Worried About Running Out of Food in the  Last Year: Never true    Ran Out of Food in the Last Year: Never true  Transportation Needs: No Transportation Needs (12/06/2022)   PRAPARE - Administrator, Civil Service (Medical): No    Lack of Transportation (Non-Medical): No  Physical Activity: Insufficiently Active (12/06/2022)   Exercise Vital Sign    Days of Exercise per Week: 2 days    Minutes of Exercise per Session: 30 min  Stress: No Stress Concern Present (12/06/2022)   [employer] of Occupational Health - Occupational Stress Questionnaire    Feeling of Stress : Only a little  Social Connections: Moderately Integrated (12/06/2022)   Social Connection and Isolation Panel [NHANES]    Frequency of Communication with Friends and Family: Twice a week    Frequency of Social Gatherings with Friends and Family: Once a week    Attends Religious Services: 1 to 4 times per year    Active Member of [employer] or Organizations: No    Attends Engineer, structural: Not on file    Marital Status: Married  Catering manager Violence: Not on file   Review of Systems No new shoes     Objective:   Physical Exam Constitutional:      Appearance: Normal appearance.  Neck:     Comments: Some restriction in rotation due to pain Tightness in right SCM and trapezius No masses  Fairly normal flexion/extension Musculoskeletal:     Comments: Normal ROM in right shoulder  No edema in right leg No calf tenderness Tibia is normal  Neurological:     Mental Status: He is alert.     Comments: Normal gait Normal leg and arm strength            Assessment & Plan:

## 2023-01-05 ENCOUNTER — Ambulatory Visit (INDEPENDENT_AMBULATORY_CARE_PROVIDER_SITE_OTHER): Payer: Medicare Other

## 2023-01-05 DIAGNOSIS — Z23 Encounter for immunization: Secondary | ICD-10-CM | POA: Diagnosis not present

## 2023-01-17 ENCOUNTER — Other Ambulatory Visit: Payer: Self-pay | Admitting: Internal Medicine

## 2023-01-17 MED ORDER — ONETOUCH VERIO VI STRP: ORAL_STRIP | 12 refills | Status: DC

## 2023-01-17 MED ORDER — ALPRAZOLAM 0.5 MG PO TABS: 0.2500 mg | ORAL_TABLET | Freq: Every day | ORAL | 0 refills | Status: DC

## 2023-01-17 MED ORDER — METFORMIN HCL ER 750 MG PO TB24: ORAL_TABLET | ORAL | 3 refills | Status: DC

## 2023-01-17 MED ORDER — ONETOUCH DELICA LANCETS 33G MISC: 1.0000 | Freq: Every day | 3 refills | Status: AC

## 2023-01-17 NOTE — Telephone Encounter (Signed)
Alprazolam last filed 09-22-22 #30/1 Worthy Rancher, NP) Last OV Acute 12-08-22 Next OV 04-06-23 [facility]

## 2023-01-17 NOTE — Telephone Encounter (Signed)
Prescription Request  01/17/2023  LOV: 12/08/2022  What is the name of the medication or equipment? metFORMIN (GLUCOPHAGE-XR) 750 MG 24 hr tablet  ALPRAZolam (XANAX) 0.5 MG tablet   glucose blood (ONETOUCH VERIO) test strip   OneTouch Delica Lancets 33G MISC   Have you contacted your pharmacy to request a refill? Yes   Which pharmacy would you like this sent to?   [facility] [address] [facility] - [place], [place] - [address] AT [facility] [address] [place] [address] Phone: [phone] Fax: [phone]    Patient notified that their request is being sent to the clinical staff for review and that they should receive a response within 2 business days.   Please advise at [facility] [phone]

## 2023-01-21 ENCOUNTER — Telehealth: Payer: Self-pay | Admitting: Internal Medicine

## 2023-01-21 ENCOUNTER — Encounter: Payer: Self-pay | Admitting: Nurse Practitioner

## 2023-01-21 ENCOUNTER — Ambulatory Visit (INDEPENDENT_AMBULATORY_CARE_PROVIDER_SITE_OTHER): Payer: Medicare Other | Admitting: Nurse Practitioner

## 2023-01-21 VITALS — BP 138/84 | HR 67 | Temp 98.0°F | Ht 69.0 in | Wt 160.0 lb

## 2023-01-21 DIAGNOSIS — I10 Essential (primary) hypertension: Secondary | ICD-10-CM | POA: Diagnosis not present

## 2023-01-21 NOTE — Telephone Encounter (Signed)
I am glad he could get in today

## 2023-01-21 NOTE — Patient Instructions (Addendum)
Please continue to measure the BP. No change in medication. Continue losartan 50 mg and metoprolol 50 mg daily.   Limit salt intake, regular exercise and walking. Managing stress level.  Please f/u with PCP is BP remain elevated.

## 2023-01-21 NOTE — Telephone Encounter (Signed)
Lyla Son RN with access nurse calling requesting appt for pt within 24 hrs.due to elevated BP.01/20/23 at 11:20 PM BP 151/100.  On 01/21/23 at 7AM BP 137/86 and now BP 137/96 P 74.pt is going to take losartan and metoprolol now. Pt does not have any CP,SOB or H/A and no dizziness.pt said on 01/19/23 had SOB after exerting self going up and down stairs.. No available appts at [facility] and  pt scheduled appt at [facility] 01/21/23 at 1:20 /pm.with C Kaur NP.UC & ED precautions given and pt voiced understanding.Sending note to Vallery Ridge NP and FYI to Dr Alphonsus Sias as PCP who is out of office today.

## 2023-01-21 NOTE — Telephone Encounter (Signed)
FYI: This call has been transferred to Access Nurse. Once the result note has been entered staff can address the message at that time.  Patient called in with the following symptoms:  Red Word:elevated blood pressure, elevated Blood sugar. Mostly happens at nighttime. &am reading was 127/86 and blood sugar 127. 11pm on 10/3 was 151/100 and blood sugar 151   Please advise at [facility] [phone]  Message is routed to Provider Pool and [facility] Triage

## 2023-01-21 NOTE — Progress Notes (Unsigned)
Established Patient Office Visit  Subjective:  Patient ID: Joseph [name], male    DOB: 1956-02-16  Age: 67 y.o. MRN: [id number]  CC:  Chief Complaint  Patient presents with   Hypertension    HPI  Joseph [name] presents for elevated BP readings in past few days.  01/19/23: 146/90,  126/75 ;  01/20/23: 137/90, 151/100; 01/21/23: 137/86,  137/96.  The patient reported increased activities since his wife's  fall.  On Wednesday while going up and down the stairs to do laundry he experienced shortness of breath .His wife checked his blood pressure and noted it was elevated.  Since that they have been monitoring the blood pressure regularly.  He had notices blood pressures are elevated more during the evening. He has been taking losartan 50 mg and metoprolol 50 mg daily  He denies any chest pain, SOB, headche.    HPI   Past Medical History:  Diagnosis Date   Allergy    Anxiety    Arthritis    knees and hip   CAD (coronary artery disease)    s/p emergent 4 V CABG (4/10) w 2/4 patent grafts by cath 08/11/09, patent LIMA to LAD & SVG to OM w moderate disease native RCA & occluded grafts to RCA and diagonal   Cataract    Chest pain    secondary to sternal non-union post CABg-now s/p sternotomy w repair 03/28/09 at [facility], dr. 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 Repeat at [facility] w reported ef of 40%.    Pneumonia 2016   in past    Restless leg syndrome    Schatzki's ring    Type 2 diabetes mellitus, controlled (HCC)  6/15   type 2    Past Surgical History:  Procedure Laterality Date   CATARACT EXTRACTION W/PHACO Right 08/18/2016   Procedure: CATARACT EXTRACTION PHACO AND INTRAOCULAR LENS PLACEMENT (IOC) Right diabetic;  Surgeon: Lockie Mola, MD;  Location: [facility];  Service: Ophthalmology;  Laterality: Right;  Diabetic-oral meds poss Latex allergy   CATARACT EXTRACTION W/PHACO Left 09/15/2016   Procedure: CATARACT EXTRACTION PHACO AND INTRAOCULAR LENS PLACEMENT (IOC)  Left diabetic;  Surgeon: Lockie Mola, MD;  Location: [facility];  Service: Ophthalmology;  Laterality: Left;  Diabetic - oral meds   COLON SURGERY  2014   for perforated colon- diverticulitis   COLONOSCOPY  2003,2010   2010 colon normal- 2003 HPP x 1    CORONARY ANGIOPLASTY  2003   stentx2   CORONARY ARTERY BYPASS GRAFT  08/16/2008   x4   ESOPHAGEAL DILATION  ~2009   Dr Kinnie Scales   ILEOSTOMY  2014   with reversal-- after diverticulitis. Sigmoid colectomy also. Dr Ely/Bird   INGUINAL HERNIA REPAIR Left x2   Dr Orson Slick   LEFT HEART CATH AND CORS/GRAFTS ANGIOGRAPHY N/A 09/27/2019   Procedure: LEFT HEART CATH AND CORS/GRAFTS ANGIOGRAPHY;  Surgeon: Kathleene Hazel, MD;  [facility]: [facility];  Service:  Cardiovascular;  Laterality: N/A;   PILONIDAL CYST EXCISION     pilonidal sinus tract excision     POLYPECTOMY  2007   HPP x 1    STERNUM SEPARATION REPAIR  03/2009   [facility]   TONSILLECTOMY  1984   UPPER GASTROINTESTINAL ENDOSCOPY     VASECTOMY      Family History  Problem Relation Age of Onset   Heart failure Mother    Diabetes Mother    Heart disease Mother    Congestive Heart Failure Mother        cause of death   Cancer Mother        ovarian?   Heart attack Father    Heart attack Sister    Diabetes Sister    Heart attack Brother    Diabetes Brother    Cancer Brother        Bladder   Heart attack Brother    Diabetes Brother    Bladder Cancer Brother    Cancer Maternal  Aunt        died of breast cancer   Breast cancer Maternal Aunt    Cancer Maternal Grandmother        died of bone cancer   Bone cancer Maternal Grandmother    Colon cancer Neg Hx    Colon polyps Neg Hx    Esophageal cancer Neg Hx    Rectal cancer Neg Hx    Stomach cancer Neg Hx     Social History   Socioeconomic History   Marital status: Married    Spouse name: Not on file   Number of children: 3   Years of education: Not on file   Highest education level: Some college, no degree  Occupational History   Occupation: Database administrator    Comment: Disabled   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  Tobacco Use   Smoking status: Former    Current packs/day: 0.00    Average packs/day: 0.5 packs/day for 37.0 years (18.5 ttl pk-yrs)    Types: Cigarettes    Start date: 07/19/1971    Quit date: 07/18/2008    Years since quitting: 14.5   Smokeless tobacco: Never   Tobacco comments:    No smoking since bypass in 4/10  Vaping Use   Vaping status: Never Used  Substance and Sexual Activity   Alcohol use: Yes    Alcohol/week: 0.0 standard drinks of alcohol    Comment: rarely   Drug use: No   Sexual activity: Not on file  Other Topics Concern   Not on file  Social History Narrative   Has living will   Wife is health care POA---alternate is daughter Lillia Abed   Would accept resuscitation   Not sure about tube feeds--probably not prolonged   Social Determinants of Health   Financial Resource Strain: Low Risk  (12/06/2022)   Overall Financial Resource Strain (CARDIA)    Difficulty of Paying Living Expenses: Not very hard  Food Insecurity: No Food Insecurity (12/06/2022)   Hunger Vital Sign    Worried About Running Out of Food in the Last Year: Never true    Ran Out of Food in the Last Year: Never true  Transportation Needs: No Transportation Needs (12/06/2022)   PRAPARE - Administrator, Civil Service (Medical): No    Lack of Transportation  (Non-Medical): No  Physical Activity: Insufficiently Active (12/06/2022)   Exercise Vital Sign    Days of Exercise per Week: 2 days  Minutes of Exercise per Session: 30 min  Stress: No Stress Concern Present (12/06/2022)   [employer] of Occupational Health - Occupational Stress Questionnaire    Feeling of Stress : Only a little  Social Connections: Moderately Integrated (12/06/2022)   Social Connection and Isolation Panel [NHANES]    Frequency of Communication with Friends and Family: Twice a week    Frequency of Social Gatherings with Friends and Family: Once a week    Attends Religious Services: 1 to 4 times per year    Active Member of [employer] or Organizations: No    Attends Engineer, structural: Not on file    Marital Status: Married  Catering manager Violence: Not on file     Outpatient Medications Prior to Visit  Medication Sig Dispense Refill   ALPRAZolam (XANAX) 0.5 MG tablet Take 0.5 tablets (0.25 mg total) by mouth at bedtime. 45 tablet 0   aspirin 81 MG EC tablet Take 81 mg by mouth daily.      atorvastatin (LIPITOR) 20 MG tablet TAKE ONE TABLET BY MOUTH DAILY 90 tablet 3   Blood Glucose Monitoring Suppl DEVI One touch Verio monitor Dx code: E11.59 1 each 0   glucose blood (ONETOUCH VERIO) test strip Use to check blood sugar once a day. Dx Code E11.9 100 each 12   ibuprofen (ADVIL,MOTRIN) 200 MG tablet Take 400 mg by mouth 2 (two) times daily as needed (pain).      Lancet Devices (ONE TOUCH DELICA LANCING DEV) MISC Use to obtain blood sample for glucose Dx Code E11.9 1 each 0   losartan (COZAAR) 50 MG tablet TAKE 1 TABLET BY MOUTH DAILY 90 tablet 3   metFORMIN (GLUCOPHAGE-XR) 750 MG 24 hr tablet TAKE 2 TABLET (750 MG TOTAL) BY MOUTH DAILY WITH BREAKFAST. 180 tablet 3   metoprolol succinate (TOPROL-XL) 50 MG 24 hr tablet TAKE ONE TABLET BY MOUTH DAILY WITH OR IMMEDIATELY FOLLOWING A MEAL 90 tablet 3   nitroGLYCERIN (NITROSTAT) 0.4 MG SL tablet Place 1 tablet  (0.4 mg total) under the tongue every 5 (five) minutes as needed for chest pain. 25 tablet 6   Omega-3 Fatty Acids (FISH OIL) 1000 MG CAPS Take 1,000 mg by mouth daily.     omeprazole (PRILOSEC) 40 MG capsule TAKE ONE CAPSULE BY MOUTH DAILY 90 capsule 3   OneTouch Delica Lancets 33G MISC 1 each by Does not apply route daily. Use to obtain sample for glucose level Dx Code E11.9 100 each 3   tiZANidine (ZANAFLEX) 2 MG tablet Take 1 tablet (2 mg total) by mouth 3 (three) times daily as needed for muscle spasms. 30 tablet 1   traZODone (DESYREL) 100 MG tablet TAKE 1 AND 1/2 TABLET BY MOUTH EVERY NIGHT AT BEDTIME AS NEEDED FOR SLEEP 135 tablet 3   No facility-administered medications prior to visit.    Allergies  Allergen Reactions   Zolpidem Other (See Comments)    hallucinate   Cephalexin Hives   Keflin [Cephalothin]     rash   Latex Dermatitis    Bandages and adhesives/ poss to latex   Lisinopril Cough    REACTION: cough   Zolpidem Tartrate     UNKNOWN   Lamisil [Terbinafine] Rash    Rash/itchy    ROS Review of Systems Negative unless indicated in HPI.    Objective:    Physical Exam Constitutional:      Appearance: Normal appearance.  HENT:     Head: Normocephalic.  Mouth/Throat:     Mouth: Mucous membranes are moist.  Eyes:     Pupils: Pupils are equal, round, and reactive to light.  Cardiovascular:     Rate and Rhythm: Normal rate and regular rhythm.     Pulses: Normal pulses.     Heart sounds: Normal heart sounds.  Pulmonary:     Effort: Pulmonary effort is normal.     Breath sounds: No stridor. No rales.  Musculoskeletal:     Cervical back: Normal range of motion.  Neurological:     General: No focal deficit present.     Mental Status: He is alert. Mental status is at baseline.  Psychiatric:        Mood and Affect: Mood normal.        Behavior: Behavior normal.        Thought Content: Thought content normal.        Judgment: Judgment normal.     BP  138/84   Pulse 67   Temp 98 F (36.7 C) (Oral)   Ht 5\' 9"  (1.753 m)   Wt 160 lb (72.6 kg)   SpO2 98%   BMI 23.63 kg/m  Wt Readings from Last 3 Encounters:  01/21/23 160 lb (72.6 kg)  12/08/22 159 lb (72.1 kg)  10/27/22 159 lb 9.6 oz (72.4 kg)     Health Maintenance  Topic Date Due   Zoster Vaccines- Shingrix (1 of 2) 10/13/1972   Pneumonia Vaccine 32+ Years old (3 of 3 - PPSV23 or PCV20) 03/16/2021   COVID-19 Vaccine (5 - 2023-24 season) 12/19/2022   OPHTHALMOLOGY EXAM  01/27/2023   Diabetic kidney evaluation - eGFR measurement  04/03/2023   Diabetic kidney evaluation - Urine ACR  04/03/2023   FOOT EXAM  04/03/2023   Medicare Annual Wellness (AWV)  04/03/2023   HEMOGLOBIN A1C  04/05/2023   Colonoscopy  12/26/2024   DTaP/Tdap/Td (3 - Td or Tdap) 03/16/2026   INFLUENZA VACCINE  Completed   Hepatitis C Screening  Completed   HPV VACCINES  Aged Out    There are no preventive care reminders to display for this patient.  No results found for: "TSH" Lab Results  Component Value Date   WBC 5.0 04/02/2022   HGB 15.6 04/02/2022   HCT 44.4 04/02/2022   MCV 90.9 04/02/2022   PLT 149.0 (L) 04/02/2022   Lab Results  Component Value Date   NA 138 04/02/2022   K 4.8 04/02/2022   CO2 27 04/02/2022   GLUCOSE 120 (H) 04/02/2022   BUN 17 04/02/2022   CREATININE 1.24 04/02/2022   BILITOT 0.9 04/02/2022   ALKPHOS 53 04/02/2022   AST 27 04/02/2022   ALT 42 04/02/2022   PROT 7.3 04/02/2022   ALBUMIN 4.9 04/02/2022   ALBUMIN 4.9 04/02/2022   CALCIUM 9.8 04/02/2022   ANIONGAP 8 12/25/2013   GFR 59.76 (L) 04/02/2022   Lab Results  Component Value Date   CHOL 108 04/02/2022   Lab Results  Component Value Date   HDL 31.70 (L) 04/02/2022   Lab Results  Component Value Date   LDLCALC 56 04/02/2022   Lab Results  Component Value Date   TRIG 100.0 04/02/2022   Lab Results  Component Value Date   CHOLHDL 3 04/02/2022   Lab Results  Component Value Date   HGBA1C 5.8  (A) 10/04/2022      Assessment & Plan:  HYPERTENSION, BENIGN Assessment & Plan: Patient BP  Vitals:   01/21/23 1327 01/21/23 1400  BP:  138/80 138/84  Advised pt to follow a low sodium and heart healthy diet. Advise DASH diet emphasizing fruits, veggies, whole grains, lean protein and low-fat dairy. Advised to monitor blood pressure at home at same time and follow-up with PCP if blood pressure remains elevated.      Follow-up: Return if symptoms worsen or fail to improve.   Kara Dies, NP

## 2023-01-24 ENCOUNTER — Other Ambulatory Visit: Payer: Self-pay

## 2023-01-24 ENCOUNTER — Encounter: Payer: Self-pay | Admitting: Emergency Medicine

## 2023-01-24 DIAGNOSIS — I2510 Atherosclerotic heart disease of native coronary artery without angina pectoris: Secondary | ICD-10-CM | POA: Diagnosis not present

## 2023-01-24 DIAGNOSIS — R42 Dizziness and giddiness: Secondary | ICD-10-CM | POA: Diagnosis present

## 2023-01-24 DIAGNOSIS — I10 Essential (primary) hypertension: Secondary | ICD-10-CM | POA: Insufficient documentation

## 2023-01-24 NOTE — Assessment & Plan Note (Signed)
Patient BP  Vitals:   01/21/23 1327 01/21/23 1400  BP: 138/80 138/84  Advised pt to follow a low sodium and heart healthy diet. Advise DASH diet emphasizing fruits, veggies, whole grains, lean protein and low-fat dairy. Advised to monitor blood pressure at home at same time and follow-up with PCP if blood pressure remains elevated.

## 2023-01-24 NOTE — ED Triage Notes (Addendum)
Pt in with dizziness, HTN and generalized weakness x few days. Pt states he takes bp meds daily, but has remained >150 SBP with each check. Denies any cp or sob, hx of quadruple bypass in 2010

## 2023-01-25 ENCOUNTER — Emergency Department
Admission: EM | Admit: 2023-01-25 | Discharge: 2023-01-25 | Disposition: A | Discharge status: Home / Self Care | Payer: Medicare Other | Attending: Emergency Medicine | Admitting: Emergency Medicine

## 2023-01-25 DIAGNOSIS — I10 Essential (primary) hypertension: Secondary | ICD-10-CM | POA: Diagnosis not present

## 2023-01-25 DIAGNOSIS — R030 Elevated blood-pressure reading, without diagnosis of hypertension: Secondary | ICD-10-CM

## 2023-01-25 LAB — URINALYSIS, ROUTINE W REFLEX MICROSCOPIC
Bilirubin Urine: NEGATIVE
Glucose, UA: NEGATIVE mg/dL
Hgb urine dipstick: NEGATIVE
Ketones, ur: NEGATIVE mg/dL
Leukocytes,Ua: NEGATIVE
Nitrite: NEGATIVE
Protein, ur: NEGATIVE mg/dL
Specific Gravity, Urine: 1.015 (ref 1.005–1.030)
pH: 6 (ref 5.0–8.0)

## 2023-01-25 LAB — CBC
HCT: 40.5 % (ref 39.0–52.0)
Hemoglobin: 14.2 g/dL (ref 13.0–17.0)
MCH: 31.5 pg (ref 26.0–34.0)
MCHC: 35.1 g/dL (ref 30.0–36.0)
MCV: 89.8 fL (ref 80.0–100.0)
Platelets: 125 10*3/uL — ABNORMAL LOW (ref 150–400)
RBC: 4.51 MIL/uL (ref 4.22–5.81)
RDW: 13.5 % (ref 11.5–15.5)
WBC: 5.2 10*3/uL (ref 4.0–10.5)
nRBC: 0 % (ref 0.0–0.2)

## 2023-01-25 LAB — CBG MONITORING, ED: Glucose-Capillary: 96 mg/dL (ref 70–99)

## 2023-01-25 LAB — BASIC METABOLIC PANEL
Anion gap: 10 (ref 5–15)
BUN: 18 mg/dL (ref 8–23)
CO2: 23 mmol/L (ref 22–32)
Calcium: 8.8 mg/dL — ABNORMAL LOW (ref 8.9–10.3)
Chloride: 104 mmol/L (ref 98–111)
Creatinine, Ser: 1.12 mg/dL (ref 0.61–1.24)
GFR, Estimated: >60 mL/min (ref >60)
Glucose, Bld: 138 mg/dL — ABNORMAL HIGH (ref 70–99)
Potassium: 4.9 mmol/L (ref 3.5–5.1)
Sodium: 137 mmol/L (ref 135–145)

## 2023-01-25 LAB — TROPONIN I (HIGH SENSITIVITY): Troponin I (High Sensitivity): 7 ng/L (ref <18)

## 2023-01-25 NOTE — Discharge Instructions (Addendum)

## 2023-01-25 NOTE — ED Provider Notes (Addendum)
[facility] Provider Note    Event Date/Time   First MD Initiated Contact with Patient 01/25/23 0153     (approximate)   History   Hypertension and Dizziness   HPI Joseph [name] is a 67 y.o. male whose medical history includes prior CABG and ischemic cardiomyopathy, essential hypertension, hyperlipidemia.  He presents for evaluation of elevated blood pressure readings.  He said he has been concerned because his systolic blood pressure has been greater than 150 for about the last 5 days.  He monitors it carefully and has noted that despite taking his regular medications, which includes losartan and metoprolol, his pressure has been elevated.  Tonight he checked it and it was again elevated at around 160/100, and he felt a little bit of generalized dizziness and weakness.  He has not been off balance and he has not felt any weakness in his arms or his legs or had any difficulty speaking or understanding speech, but given his persistent blood pressure he decided he should get it checked out.  He is feeling well at this time.  No recent fever or chills.  No nausea nor vomiting.  No chest pain or shortness of breath.  No abdominal pain.  No other recent new medications.     Physical Exam   Triage Vital Signs: ED Triage Vitals  Encounter Vitals Group     BP 01/24/23 2349 (!) 151/95     Systolic BP Percentile --      Diastolic BP Percentile --      Pulse Rate 01/24/23 2349 62     Resp 01/24/23 2349 18     Temp 01/24/23 2349 98 F (36.7 C)     Temp Source 01/24/23 2349 Oral     SpO2 01/24/23 2349 99 %     Weight 01/24/23 2348 72.6 kg (160 lb)     Height 01/24/23 2348 1.753 m (5\' 9" )     Head Circumference --      Peak Flow --      Pain Score 01/24/23 2355 0     Pain Loc --      Pain Education --      Exclude from Growth Chart --     Most recent vital signs: Vitals:   01/25/23 0145 01/25/23 0200  BP: (!) 165/93 (!) 161/96  Pulse: (!) 51 (!) 55  Resp:  15 13  Temp:    SpO2:      General: Awake, no distress.  CV:  Good peripheral perfusion.  Normal heart sounds.  Prior CABG scar well-healed. Resp:  Normal effort. Speaking easily and comfortably, no accessory muscle usage nor intercostal retractions.  Lungs clear to auscultation. Abd:  No distention.  No pulsatile abdominal masses.   ED Results / Procedures / Treatments   Labs (all labs ordered are listed, but only abnormal results are displayed) Labs Reviewed  BASIC METABOLIC PANEL - Abnormal; Notable for the following components:      Result Value   Glucose, Bld 138 (*)    Calcium 8.8 (*)    All other components within normal limits  CBC - Abnormal; Notable for the following components:   Platelets 125 (*)    All other components within normal limits  URINALYSIS, ROUTINE W REFLEX MICROSCOPIC - Abnormal; Notable for the following components:   Color, Urine YELLOW (*)    APPearance CLEAR (*)    All other components within normal limits  CBG MONITORING, ED  TROPONIN I (HIGH  SENSITIVITY)     EKG  ED ECG REPORT I, Loleta Rose, the attending physician, personally viewed and interpreted this ECG.  Date: 01/24/2023 EKG Time: 23: 54 Rate: 61 Rhythm: normal sinus rhythm QRS Axis: normal Intervals: normal ST/T Wave abnormalities: Non-specific ST segment / T-wave changes, but no clear evidence of acute ischemia. Narrative Interpretation: no definitive evidence of acute ischemia; does not meet STEMI criteria.    PROCEDURES:  Critical Care performed: No  Procedures    IMPRESSION / MDM / ASSESSMENT AND PLAN / ED COURSE  I reviewed the triage vital signs and the nursing notes.                              Differential diagnosis includes, but is not limited to, essential/primary hypertension, medication side effect, renal dysfunction, electrolyte or metabolic abnormality, less likely ACS.  Patient's presentation is most consistent with acute presentation with  potential threat to life or bodily function.  Labs/studies ordered: CBG, high-sensitivity troponin, BMP, CBC, urinalysis, EKG  Interventions/Medications given:  Medications - No data to display  (Note:  hospital course my include additional interventions and/or labs/studies not listed above.)   Vital signs are stable and within normal limits other than hypertension which is moderate at worst.  Labs are all essentially normal with no significant abnormalities.  Nonischemic EKG.  No evidence of neurological disorder or deficit at this time.  I provided reassurance that his workup is reassuring and that his hypertension does not need aggressive intervention.  He and his wife are relieved.  I considered adding medications to his regimen but this is better addressed by his primary care doctor.  He heartily agrees with this plan and will follow-up as an outpatient at the next available opportunity.  I gave my usual and customary return precautions.       FINAL CLINICAL IMPRESSION(S) / ED DIAGNOSES   Final diagnoses:  Elevated blood pressure reading     Rx / DC Orders   ED Discharge Orders     None        Note:  This document was prepared using Dragon voice recognition software and may include unintentional dictation errors.   Loleta Rose, MD 01/25/23 [name]    Loleta Rose, MD 01/25/23 [phone]

## 2023-01-26 ENCOUNTER — Ambulatory Visit (INDEPENDENT_AMBULATORY_CARE_PROVIDER_SITE_OTHER): Payer: Medicare Other | Admitting: Internal Medicine

## 2023-01-26 ENCOUNTER — Telehealth: Payer: Self-pay

## 2023-01-26 ENCOUNTER — Encounter: Payer: Self-pay | Admitting: Internal Medicine

## 2023-01-26 VITALS — BP 126/78 | HR 68 | Temp 98.5°F | Ht 69.0 in | Wt 161.0 lb

## 2023-01-26 DIAGNOSIS — I10 Essential (primary) hypertension: Secondary | ICD-10-CM | POA: Diagnosis not present

## 2023-01-26 MED ORDER — LOSARTAN POTASSIUM 100 MG PO TABS: 100.0000 mg | ORAL_TABLET | Freq: Every morning | ORAL | 3 refills | Status: DC

## 2023-01-26 MED ORDER — METOPROLOL SUCCINATE ER 50 MG PO TB24: 50.0000 mg | ORAL_TABLET | Freq: Every evening | ORAL | 0 refills | Status: DC

## 2023-01-26 NOTE — Telephone Encounter (Signed)
Pt already has appt with Dr Alphonsus Sias 01/26/23 at 10:30. Sending note to Dr Alphonsus Sias.

## 2023-01-26 NOTE — Progress Notes (Signed)
Subjective:    Patient ID: Joseph [name], male    DOB: 16-Jan-1956, 67 y.o.   MRN: [id number]  HPI Here for ER follow up--with wife  Was seen 10/4 at [facility] Wife went up and down steps--and felt heart beating hard and SOB. But he did have more than typical exertion BP was elevated then Started to monitor it but called in Mildly elevated then BP is higher at night  Went to ER yesterday--after BP very high Felt dizzy and weak/tired Mildly elevated but sent home  No orthostatic dizziness  Current Outpatient Medications on File Prior to Visit  Medication Sig Dispense Refill   ALPRAZolam (XANAX) 0.5 MG tablet Take 0.5 tablets (0.25 mg total) by mouth at bedtime. 45 tablet 0   aspirin 81 MG EC tablet Take 81 mg by mouth daily.      atorvastatin (LIPITOR) 20 MG tablet TAKE ONE TABLET BY MOUTH DAILY 90 tablet 3   Blood Glucose Monitoring Suppl DEVI One touch Verio monitor Dx code: E11.59 1 each 0   glucose blood (ONETOUCH VERIO) test strip Use to check blood sugar once a day. Dx Code E11.9 100 each 12   ibuprofen (ADVIL,MOTRIN) 200 MG tablet Take 400 mg by mouth 2 (two) times daily as needed (pain).      Lancet Devices (ONE TOUCH DELICA LANCING DEV) MISC Use to obtain blood sample for glucose Dx Code E11.9 1 each 0   losartan (COZAAR) 50 MG tablet TAKE 1 TABLET BY MOUTH DAILY 90 tablet 3   metFORMIN (GLUCOPHAGE-XR) 750 MG 24 hr tablet TAKE 2 TABLET (750 MG TOTAL) BY MOUTH DAILY WITH BREAKFAST. 180 tablet 3   metoprolol succinate (TOPROL-XL) 50 MG 24 hr tablet TAKE ONE TABLET BY MOUTH DAILY WITH OR IMMEDIATELY FOLLOWING A MEAL 90 tablet 3   nitroGLYCERIN (NITROSTAT) 0.4 MG SL tablet Place 1 tablet (0.4 mg total) under the tongue every 5 (five) minutes as needed for chest pain. 25 tablet 6   Omega-3 Fatty Acids (FISH OIL) 1000 MG CAPS Take 1,000 mg by mouth daily.     omeprazole (PRILOSEC) 40 MG capsule TAKE ONE CAPSULE BY MOUTH DAILY 90 capsule 3   OneTouch Delica Lancets 33G MISC 1  each by Does not apply route daily. Use to obtain sample for glucose level Dx Code E11.9 100 each 3   tiZANidine (ZANAFLEX) 2 MG tablet Take 1 tablet (2 mg total) by mouth 3 (three) times daily as needed for muscle spasms. 30 tablet 1   traZODone (DESYREL) 100 MG tablet TAKE 1 AND 1/2 TABLET BY MOUTH EVERY NIGHT AT BEDTIME AS NEEDED FOR SLEEP 135 tablet 3   [DISCONTINUED] pravastatin (PRAVACHOL) 40 MG tablet TAKE 1 TABLET BY MOUTH DAILY. 90 tablet 3   No current facility-administered medications on file prior to visit.    Allergies  Allergen Reactions   Zolpidem Other (See Comments)    hallucinate   Cephalexin Hives   Keflin [Cephalothin]     rash   Latex Dermatitis    Bandages and adhesives/ poss to latex   Lisinopril Cough    REACTION: cough   Zolpidem Tartrate     UNKNOWN   Lamisil [Terbinafine] Rash    Rash/itchy    Past Medical History:  Diagnosis Date   Allergy    Anxiety    Arthritis    knees and hip   CAD (coronary artery disease)    s/p emergent 4 V CABG (4/10) w 2/4 patent grafts by cath 08/11/09,  patent LIMA to LAD & SVG to OM w moderate disease native RCA & occluded grafts to RCA and diagonal   Cataract    Chest pain    secondary to sternal non-union post CABg-now s/p sternotomy w repair 03/28/09 at [facility], dr. 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 Repeat at [facility] w reported ef of 40%.    Pneumonia 2016   in past    Restless leg syndrome    Schatzki's ring    Type 2 diabetes mellitus, controlled  (HCC) 6/15   type 2    Past Surgical History:  Procedure Laterality Date   CATARACT EXTRACTION W/PHACO Right 08/18/2016   Procedure: CATARACT EXTRACTION PHACO AND INTRAOCULAR LENS PLACEMENT (IOC) Right diabetic;  Surgeon: Lockie Mola, MD;  Location: [facility];  Service: Ophthalmology;  Laterality: Right;  Diabetic-oral meds poss Latex allergy   CATARACT EXTRACTION W/PHACO Left 09/15/2016   Procedure: CATARACT EXTRACTION PHACO AND INTRAOCULAR LENS PLACEMENT (IOC)  Left diabetic;  Surgeon: Lockie Mola, MD;  Location: [facility];  Service: Ophthalmology;  Laterality: Left;  Diabetic - oral meds   COLON SURGERY  2014   for perforated colon- diverticulitis   COLONOSCOPY  2003,2010   2010 colon normal- 2003 HPP x 1    CORONARY ANGIOPLASTY  2003   stentx2   CORONARY ARTERY BYPASS GRAFT  08/16/2008   x4   ESOPHAGEAL DILATION  ~2009   Dr Kinnie Scales   ILEOSTOMY  2014   with reversal-- after diverticulitis. Sigmoid colectomy also. Dr Ely/Bird   INGUINAL HERNIA REPAIR Left x2   Dr Orson Slick   LEFT HEART CATH AND CORS/GRAFTS ANGIOGRAPHY N/A 09/27/2019   Procedure: LEFT HEART CATH AND CORS/GRAFTS ANGIOGRAPHY;  Surgeon: Kathleene Hazel, MD;  Location: [facility]AS[facility];  Service: Cardiovascular;  Laterality: N/A;   PILONIDAL CYST EXCISION     pilonidal sinus tract excision     POLYPECTOMY  2007   HPP x 1    STERNUM SEPARATION REPAIR  03/2009   [facility]   TONSILLECTOMY  1984   UPPER GASTROINTESTINAL ENDOSCOPY     VASECTOMY      Family History  Problem Relation Age of Onset   Heart failure Mother    Diabetes Mother    Heart disease Mother    Congestive Heart Failure Mother        cause of death   Cancer Mother        ovarian?   Heart attack Father    Heart attack Sister    Diabetes Sister    Heart attack Brother    Diabetes Brother    Cancer Brother        Bladder   Heart attack Brother    Diabetes Brother    Bladder Cancer Brother    Cancer  Maternal Aunt        died of breast cancer   Breast cancer Maternal Aunt    Cancer Maternal Grandmother  died of bone cancer   Bone cancer Maternal Grandmother    Colon cancer Neg Hx    Colon polyps Neg Hx    Esophageal cancer Neg Hx    Rectal cancer Neg Hx    Stomach cancer Neg Hx     Social History   Socioeconomic History   Marital status: Married    Spouse name: Not on file   Number of children: 3   Years of education: Not on file   Highest education level: Some college, no degree  Occupational History   Occupation: Database administrator    Comment: Disabled   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  Tobacco Use   Smoking status: Former    Current packs/day: 0.00    Average packs/day: 0.5 packs/day for 37.0 years (18.5 ttl pk-yrs)    Types: Cigarettes    Start date: 07/19/1971    Quit date: 07/18/2008    Years since quitting: 14.5   Smokeless tobacco: Never   Tobacco comments:    No smoking since bypass in 4/10  Vaping Use   Vaping status: Never Used  Substance and Sexual Activity   Alcohol use: Yes    Alcohol/week: 0.0 standard drinks of alcohol    Comment: rarely   Drug use: No   Sexual activity: Not on file  Other Topics Concern   Not on file  Social History Narrative   Has living will   Wife is health care POA---alternate is daughter Lillia Abed   Would accept resuscitation   Not sure about tube feeds--probably not prolonged   Social Determinants of Health   Financial Resource Strain: Low Risk  (12/06/2022)   Overall Financial Resource Strain (CARDIA)    Difficulty of Paying Living Expenses: Not very hard  Food Insecurity: No Food Insecurity (12/06/2022)   Hunger Vital Sign    Worried About Running Out of Food in the Last Year: Never true    Ran Out of Food in the Last Year: Never true  Transportation Needs: No Transportation Needs (12/06/2022)   PRAPARE - Administrator, Civil Service (Medical): No    Lack of  Transportation (Non-Medical): No  Physical Activity: Insufficiently Active (12/06/2022)   Exercise Vital Sign    Days of Exercise per Week: 2 days    Minutes of Exercise per Session: 30 min  Stress: No Stress Concern Present (12/06/2022)   [employer] of Occupational Health - Occupational Stress Questionnaire    Feeling of Stress : Only a little  Social Connections: Moderately Integrated (12/06/2022)   Social Connection and Isolation Panel [NHANES]    Frequency of Communication with Friends and Family: Twice a week    Frequency of Social Gatherings with Friends and Family: Once a week    Attends Religious Services: 1 to 4 times per year    Active Member of [employer] or Organizations: No    Attends Engineer, structural: Not on file    Marital Status: Married  Catering manager Violence: Not on file   Review of Systems     Objective:   Physical Exam Constitutional:      Appearance: Normal appearance.  Cardiovascular:     Rate and Rhythm: Normal rate and regular rhythm.     Heart sounds: No murmur heard.    No gallop.  Pulmonary:     Effort: Pulmonary effort is normal.     Breath sounds: Normal breath sounds. No wheezing or rales.  Musculoskeletal:  Cervical back: Neck supple.  Lymphadenopathy:     Cervical: No cervical adenopathy.  Neurological:     Mental Status: He is alert.            Assessment & Plan:

## 2023-01-26 NOTE — Assessment & Plan Note (Signed)
BP Readings from Last 3 Encounters:  01/26/23 126/78  01/25/23 (!) 161/96  01/21/23 138/84   Has had intermittent elevations Doesn't think his exercise tolerance has changed----was overly tired that one time when he was running around Will continue the metoprolol 50mg  daily --rate 60 now Will increase the losartan to 100mg  daily (take separate from the metoprolol)

## 2023-01-26 NOTE — Telephone Encounter (Signed)
Okay to eat and drink--but his appt is in a few minutes

## 2023-01-28 DIAGNOSIS — E119 Type 2 diabetes mellitus without complications: Secondary | ICD-10-CM | POA: Diagnosis not present

## 2023-01-28 DIAGNOSIS — Z961 Presence of intraocular lens: Secondary | ICD-10-CM | POA: Diagnosis not present

## 2023-01-28 DIAGNOSIS — H40053 Ocular hypertension, bilateral: Secondary | ICD-10-CM | POA: Diagnosis not present

## 2023-01-28 LAB — HM DIABETES EYE EXAM

## 2023-02-02 ENCOUNTER — Telehealth: Payer: Self-pay | Admitting: Internal Medicine

## 2023-02-02 NOTE — Telephone Encounter (Signed)
Per appt notes pt already has appt scheduled with Dr Alphonsus Sias on 02/07/23 at 11:30. Sending note to Dr Alphonsus Sias who is out of office as FYI and Dr Milinda Antis who is in office.

## 2023-02-02 NOTE — Telephone Encounter (Signed)
FYI: This call has been transferred to Access Nurse. Once the result note has been entered staff can address the message at that time.  Patient called in with the following symptoms:  Red Word:elevated blood pressure, reading last night was 164/105 pt had a slight headache/ some fatigue, blood sugar read 168. This morning BP was 115/86 and pt was feeling fine, blood sugar was 101.   Please advise at [facility] [phone]  Message is routed to Provider Pool and [facility] Triage

## 2023-02-02 NOTE — Telephone Encounter (Signed)
Have him monitor his BP (once a day is generally enough) and bring in his numbers--we will review next week     Pt and pts wife (on speaker phone) notified of Dr Vassie Moselle above instructions and pt and pts wife voiced understanding,. Also verified was appt on 02/07/23 at 11:30 with Dr Alphonsus Sias.

## 2023-02-07 ENCOUNTER — Ambulatory Visit: Payer: Medicare Other | Admitting: Internal Medicine

## 2023-02-07 ENCOUNTER — Encounter: Payer: Self-pay | Admitting: Internal Medicine

## 2023-02-07 VITALS — BP 110/80 | HR 60 | Temp 97.9°F | Ht 69.0 in | Wt 160.0 lb

## 2023-02-07 DIAGNOSIS — I10 Essential (primary) hypertension: Secondary | ICD-10-CM

## 2023-02-07 DIAGNOSIS — I2510 Atherosclerotic heart disease of native coronary artery without angina pectoris: Secondary | ICD-10-CM

## 2023-02-07 NOTE — Progress Notes (Signed)
Subjective:    Patient ID: Joseph [name], male    DOB: 1956/04/04, 67 y.o.   MRN: [id number]  HPI Here with wife due to ongoing concerns about his blood pressure  Has been monitoring his BP multiple times a day Highest was 164/105---evening 10/15 Most of other tests was 140 or less systolic Diastolics mostly in 40'J Tested 2 days ago--- in evening--- 87/55 and 85/56  Has monitor with him--he got 120/79 (110/80 here) HR 60  No palpitations No chest pain Does exercise--no change in exercise tolerance (brisk walk) Has had dizziness twice--- and weak feeling (once prompted the ER visit) No apparent orthostatic dizziness  Current Outpatient Medications on File Prior to Visit  Medication Sig Dispense Refill   ALPRAZolam (XANAX) 0.5 MG tablet Take 0.5 tablets (0.25 mg total) by mouth at bedtime. 45 tablet 0   aspirin 81 MG EC tablet Take 81 mg by mouth daily.      atorvastatin (LIPITOR) 20 MG tablet TAKE ONE TABLET BY MOUTH DAILY 90 tablet 3   Blood Glucose Monitoring Suppl DEVI One touch Verio monitor Dx code: E11.59 1 each 0   glucose blood (ONETOUCH VERIO) test strip Use to check blood sugar once a day. Dx Code E11.9 100 each 12   ibuprofen (ADVIL,MOTRIN) 200 MG tablet Take 400 mg by mouth 2 (two) times daily as needed (pain).      Lancet Devices (ONE TOUCH DELICA LANCING DEV) MISC Use to obtain blood sample for glucose Dx Code E11.9 1 each 0   losartan (COZAAR) 100 MG tablet Take 1 tablet (100 mg total) by mouth in the morning. 90 tablet 3   metFORMIN (GLUCOPHAGE-XR) 750 MG 24 hr tablet TAKE 2 TABLET (750 MG TOTAL) BY MOUTH DAILY WITH BREAKFAST. 180 tablet 3   metoprolol succinate (TOPROL-XL) 50 MG 24 hr tablet Take 1 tablet (50 mg total) by mouth every evening. Take with or immediately following a meal. 1 tablet 0   nitroGLYCERIN (NITROSTAT) 0.4 MG SL tablet Place 1 tablet (0.4 mg total) under the tongue every 5 (five) minutes as needed for chest pain. 25 tablet 6   Omega-3 Fatty  Acids (FISH OIL) 1000 MG CAPS Take 1,000 mg by mouth daily.     omeprazole (PRILOSEC) 40 MG capsule TAKE ONE CAPSULE BY MOUTH DAILY 90 capsule 3   OneTouch Delica Lancets 33G MISC 1 each by Does not apply route daily. Use to obtain sample for glucose level Dx Code E11.9 100 each 3   tiZANidine (ZANAFLEX) 2 MG tablet Take 1 tablet (2 mg total) by mouth 3 (three) times daily as needed for muscle spasms. 30 tablet 1   traZODone (DESYREL) 100 MG tablet TAKE 1 AND 1/2 TABLET BY MOUTH EVERY NIGHT AT BEDTIME AS NEEDED FOR SLEEP 135 tablet 3   [DISCONTINUED] pravastatin (PRAVACHOL) 40 MG tablet TAKE 1 TABLET BY MOUTH DAILY. 90 tablet 3   No current facility-administered medications on file prior to visit.    Allergies  Allergen Reactions   Zolpidem Other (See Comments)    hallucinate   Cephalexin Hives   Keflin [Cephalothin]     rash   Latex Dermatitis    Bandages and adhesives/ poss to latex   Lisinopril Cough    REACTION: cough   Zolpidem Tartrate     UNKNOWN   Lamisil [Terbinafine] Rash    Rash/itchy    Past Medical History:  Diagnosis Date   Allergy    Anxiety    Arthritis  knees and hip   CAD (coronary artery disease)    s/p emergent 4 V CABG (4/10) w 2/4 patent grafts by cath 08/11/09, patent LIMA to LAD & SVG to OM w moderate disease native RCA & occluded grafts to RCA and diagonal   Cataract    Chest pain    secondary to sternal non-union post CABg-now s/p sternotomy w repair 03/28/09 at [facility], dr. 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 Repeat at [facility] w reported ef of 40%.    Pneumonia 2016   in past    Restless leg syndrome    Schatzki's ring    Type 2 diabetes mellitus, controlled (HCC) 6/15   type 2    Past Surgical History:  Procedure Laterality Date   CATARACT EXTRACTION W/PHACO Right 08/18/2016   Procedure: CATARACT EXTRACTION PHACO AND INTRAOCULAR LENS PLACEMENT (IOC) Right diabetic;  Surgeon: Lockie Mola, MD;  Location: [facility];  Service: Ophthalmology;  Laterality: Right;  Diabetic-oral meds poss Latex allergy   CATARACT EXTRACTION W/PHACO Left 09/15/2016   Procedure: CATARACT EXTRACTION PHACO AND INTRAOCULAR LENS PLACEMENT (IOC)  Left diabetic;  Surgeon: Lockie Mola, MD;  Location: [facility];  Service: Ophthalmology;  Laterality: Left;  Diabetic - oral meds   COLON SURGERY  2014   for perforated colon- diverticulitis   COLONOSCOPY  2003,2010   2010 colon normal- 2003 HPP x 1    CORONARY ANGIOPLASTY  2003   stentx2   CORONARY ARTERY BYPASS GRAFT  08/16/2008   x4   ESOPHAGEAL DILATION  ~2009   Dr Kinnie Scales   ILEOSTOMY  2014   with reversal-- after diverticulitis. Sigmoid colectomy also. Dr Ely/Bird   INGUINAL HERNIA REPAIR Left x2   Dr Orson Slick   LEFT HEART CATH AND CORS/GRAFTS ANGIOGRAPHY N/A 09/27/2019   Procedure: LEFT HEART CATH AND CORS/GRAFTS ANGIOGRAPHY;  Surgeon: Kathleene Hazel, MD;  Location: [facility];  Service: Cardiovascular;  Laterality: N/A;   PILONIDAL CYST EXCISION     pilonidal sinus tract excision     POLYPECTOMY  2007   HPP x 1    STERNUM SEPARATION REPAIR  03/2009   [facility]   TONSILLECTOMY  1984   UPPER GASTROINTESTINAL ENDOSCOPY     VASECTOMY      Family History  Problem Relation Age of Onset   Heart failure Mother    Diabetes Mother    Heart disease Mother    Congestive Heart Failure Mother        cause of death   Cancer Mother        ovarian?   Heart attack Father    Heart attack Sister     Diabetes Sister    Heart attack Brother    Diabetes Brother    Cancer Brother        Bladder   Heart attack Brother    Diabetes Brother    Bladder Cancer Brother    Cancer Maternal Aunt  died of breast cancer   Breast cancer Maternal Aunt    Cancer Maternal Grandmother        died of bone cancer   Bone cancer Maternal Grandmother    Colon cancer Neg Hx    Colon polyps Neg Hx    Esophageal cancer Neg Hx    Rectal cancer Neg Hx    Stomach cancer Neg Hx     Social History   Socioeconomic History   Marital status: Married    Spouse name: Not on file   Number of children: 3   Years of education: Not on file   Highest education level: Some college, no degree  Occupational History   Occupation: Database administrator    Comment: Disabled   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  Tobacco Use   Smoking status: Former    Current packs/day: 0.00    Average packs/day: 0.5 packs/day for 37.0 years (18.5 ttl pk-yrs)    Types: Cigarettes    Start date: 07/19/1971    Quit date: 07/18/2008    Years since quitting: 14.5   Smokeless tobacco: Never   Tobacco comments:    No smoking since bypass in 4/10  Vaping Use   Vaping status: Never Used  Substance and Sexual Activity   Alcohol use: Yes    Alcohol/week: 0.0 standard drinks of alcohol    Comment: rarely   Drug use: No   Sexual activity: Not on file  Other Topics Concern   Not on file  Social History Narrative   Has living will   Wife is health care POA---alternate is daughter Lillia Abed   Would accept resuscitation   Not sure about tube feeds--probably not prolonged   Social Determinants of Health   Financial Resource Strain: Low Risk  (02/03/2023)   Overall Financial Resource Strain (CARDIA)    Difficulty of Paying Living Expenses: Not hard at all  Food Insecurity: No Food Insecurity (02/03/2023)   Hunger Vital Sign    Worried About Running Out of Food in the Last Year: Never true    Ran Out  of Food in the Last Year: Never true  Transportation Needs: No Transportation Needs (02/03/2023)   PRAPARE - Administrator, Civil Service (Medical): No    Lack of Transportation (Non-Medical): No  Physical Activity: Insufficiently Active (02/03/2023)   Exercise Vital Sign    Days of Exercise per Week: 3 days    Minutes of Exercise per Session: 30 min  Stress: No Stress Concern Present (02/03/2023)   [employer] of Occupational Health - Occupational Stress Questionnaire    Feeling of Stress : Only a little  Social Connections: Moderately Integrated (02/03/2023)   Social Connection and Isolation Panel [NHANES]    Frequency of Communication with Friends and Family: More than three times a week    Frequency of Social Gatherings with Friends and Family: Twice a week    Attends Religious Services: 1 to 4 times per year    Active Member of [employer] or Organizations: No    Attends Engineer, structural: Not on file    Marital Status: Married  Catering manager Violence: Not on file   Review of Systems Sleeps well with trazodone/alprazolam nightly Appetite is good     Objective:   Physical Exam Constitutional:      Appearance: Normal appearance.  Cardiovascular:     Rate and Rhythm: Normal rate and regular rhythm.     Heart sounds: No murmur heard.  No gallop.  Pulmonary:     Effort: Pulmonary effort is normal.     Breath sounds: Normal breath sounds. No wheezing or rales.  Musculoskeletal:     Cervical back: Neck supple.     Right lower leg: No edema.     Left lower leg: No edema.  Lymphadenopathy:     Cervical: No cervical adenopathy.  Neurological:     Mental Status: He is alert.            Assessment & Plan:

## 2023-02-07 NOTE — Assessment & Plan Note (Signed)
BP Readings from Last 3 Encounters:  02/07/23 110/80  01/26/23 126/78  01/25/23 (!) 161/96   Good control with the losartan 100 and metoprolol 50mg   Discussed moderating his checking and understanding the variability

## 2023-02-07 NOTE — Assessment & Plan Note (Signed)
No angina symptoms Keeps up with Dr Clifton James

## 2023-02-22 ENCOUNTER — Ambulatory Visit
Admission: EM | Admit: 2023-02-22 | Discharge: 2023-02-22 | Disposition: A | Discharge status: Home / Self Care | Payer: Medicare Other | Attending: Emergency Medicine | Admitting: Emergency Medicine

## 2023-02-22 DIAGNOSIS — J069 Acute upper respiratory infection, unspecified: Secondary | ICD-10-CM

## 2023-02-22 LAB — POC COVID19/FLU A&B COMBO
Covid Antigen, POC: NEGATIVE
Influenza A Antigen, POC: NEGATIVE
Influenza B Antigen, POC: NEGATIVE

## 2023-02-22 MED ORDER — ALBUTEROL SULFATE HFA 108 (90 BASE) MCG/ACT IN AERS: 2.0000 | INHALATION_SPRAY | RESPIRATORY_TRACT | 0 refills | Status: DC | PRN

## 2023-02-22 NOTE — ED Provider Notes (Signed)
Joseph [name]    CSN: [id number] Arrival date & time: 02/22/23  1432      History   Chief Complaint Chief Complaint  Patient presents with   Nasal Congestion    HPI Joseph [name] is a 67 y.o. male.   Patient presents for evaluation for fever, chills, body aches, nasal congestion present for 2 days.  Her wife patient has been experiencing a productive cough however patient relates this to acid reflux.  Has begun to experience wheezing.  No known sick contacts prior.  Tolerating food and liquids.  Has not attempted treatment of symptoms.  Has taken home COVID test which was negative.  Denies shortness of breath.  Past Medical History:  Diagnosis Date   Allergy    Anxiety    Arthritis    knees and hip   CAD (coronary artery disease)    s/p emergent 4 V CABG (4/10) w 2/4 patent grafts by cath 08/11/09, patent LIMA to LAD & SVG to OM w moderate disease native RCA & occluded grafts to RCA and diagonal   Cataract    Chest pain    secondary to sternal non-union post CABg-now s/p sternotomy w repair 03/28/09 at [facility], dr. 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 Repeat at [facility] w reported ef of 40%.    Pneumonia 2016   in past    Restless leg syndrome    Schatzki's ring    Type 2 diabetes mellitus, controlled (HCC) 6/15   type 2    Patient Active Problem List   Diagnosis Date Noted   Stage 3a chronic kidney disease (HCC) 10/04/2022   Cervical strain 02/22/2022   Osteoarthritis of both knees 09/18/2021    Thrombocytopenia (HCC) 03/20/2020   Advance directive discussed with patient 02/23/2019   Chronic low back pain 07/30/2015   Preventative health care 07/24/2014   Type 2 diabetes mellitus with other circulatory complications (HCC)    GERD (gastroesophageal reflux disease)    CAD (coronary artery disease) 12/08/2010   HYPERTENSION, BENIGN 04/28/2009   CARDIOMYOPATHY, ISCHEMIC 04/28/2009   Mood disorder (HCC) 12/10/2008   Hyperlipidemia 09/03/2008   ERECTILE DYSFUNCTION 09/03/2008   Coronary atherosclerosis of native coronary artery 09/03/2008   CAD, ARTERY BYPASS GRAFT 09/03/2008    Past Surgical History:  Procedure Laterality Date   CATARACT EXTRACTION W/PHACO Right 08/18/2016   Procedure: CATARACT EXTRACTION PHACO AND INTRAOCULAR LENS PLACEMENT (IOC) Right diabetic;  Surgeon: Lockie Mola, MD;  Location: [facility];  Service: Ophthalmology;  Laterality: Right;  Diabetic-oral meds poss Latex allergy   CATARACT EXTRACTION W/PHACO Left 09/15/2016   Procedure: CATARACT EXTRACTION PHACO AND INTRAOCULAR LENS PLACEMENT (IOC)  Left diabetic;  Surgeon: Lockie Mola, MD;  Location: [facility];  Service: Ophthalmology;  Laterality: Left;  Diabetic - oral meds   COLON SURGERY  2014   for perforated colon- diverticulitis   COLONOSCOPY  [id number]   2010 colon normal- 2003 HPP x 1  CORONARY ANGIOPLASTY  2003   stentx2   CORONARY ARTERY BYPASS GRAFT  08/16/2008   x4   ESOPHAGEAL DILATION  ~2009   Dr Kinnie Scales   ILEOSTOMY  2014   with reversal-- after diverticulitis. Sigmoid colectomy also. Dr Ely/Bird   INGUINAL HERNIA REPAIR Left x2   Dr Orson Slick   LEFT HEART CATH AND CORS/GRAFTS ANGIOGRAPHY N/A 09/27/2019   Procedure: LEFT HEART CATH AND CORS/GRAFTS ANGIOGRAPHY;  Surgeon: Kathleene Hazel, MD;  Location: [facility];  Service: Cardiovascular;  Laterality: N/A;   PILONIDAL CYST EXCISION     pilonidal sinus tract excision     POLYPECTOMY  2007   HPP x  1    STERNUM SEPARATION REPAIR  03/2009   [facility]   TONSILLECTOMY  1984   UPPER GASTROINTESTINAL ENDOSCOPY     VASECTOMY         Home Medications    Prior to Admission medications   Medication Sig Start Date End Date Taking? Authorizing Provider  albuterol (VENTOLIN HFA) 108 (90 Base) MCG/ACT inhaler Inhale 2 puffs into the lungs every 4 (four) hours as needed for wheezing or shortness of breath. 02/22/23  Yes Detria Cummings, Elita Boone, NP  ALPRAZolam (XANAX) 0.5 MG tablet Take 0.5 tablets (0.25 mg total) by mouth at bedtime. 01/17/23   Karie Schwalbe, MD  aspirin 81 MG EC tablet Take 81 mg by mouth daily.     [provider]  atorvastatin (LIPITOR) 20 MG tablet TAKE ONE TABLET BY MOUTH DAILY 07/01/22   Karie Schwalbe, MD  Blood Glucose Monitoring Suppl DEVI One touch Verio monitor Dx code: E11.59 08/30/22   Tillman Abide I, MD  glucose blood (ONETOUCH VERIO) test strip Use to check blood sugar once a day. Dx Code E11.9 01/17/23   Karie Schwalbe, MD  ibuprofen (ADVIL,MOTRIN) 200 MG tablet Take 400 mg by mouth 2 (two) times daily as needed (pain).     [provider]  Lancet Devices (ONE TOUCH DELICA LANCING DEV) MISC Use to obtain blood sample for glucose Dx Code E11.9 08/25/22   Karie Schwalbe, MD  losartan (COZAAR) 100 MG tablet Take 1 tablet (100 mg total) by mouth in the morning. 01/26/23   Karie Schwalbe, MD  metFORMIN (GLUCOPHAGE-XR) 750 MG 24 hr tablet TAKE 2 TABLET (750 MG TOTAL) BY MOUTH DAILY WITH BREAKFAST. 01/17/23   Karie Schwalbe, MD  metoprolol succinate (TOPROL-XL) 50 MG 24 hr tablet Take 1 tablet (50 mg total) by mouth every evening. Take with or immediately following a meal. 01/26/23   Karie Schwalbe, MD  nitroGLYCERIN (NITROSTAT) 0.4 MG SL tablet Place 1 tablet (0.4 mg total) under the tongue every 5 (five) minutes as needed for chest pain. 10/27/22   Kathleene Hazel, MD  Omega-3 Fatty Acids (FISH OIL) 1000 MG CAPS Take 1,000 mg by mouth  daily.    [provider]  omeprazole (PRILOSEC) 40 MG capsule TAKE ONE CAPSULE BY MOUTH DAILY 07/05/22   Karie Schwalbe, MD  OneTouch Delica Lancets 33G MISC 1 each by Does not apply route daily. Use to obtain sample for glucose level Dx Code E11.9 01/17/23   Karie Schwalbe, MD  tiZANidine (ZANAFLEX) 2 MG tablet Take 1 tablet (2 mg total) by mouth 3 (three) times daily as needed for muscle spasms. 12/08/22   Karie Schwalbe, MD  traZODone (DESYREL) 100 MG tablet TAKE 1 AND 1/2 TABLET BY MOUTH EVERY NIGHT AT BEDTIME AS NEEDED FOR  SLEEP 06/28/22   Karie Schwalbe, MD  pravastatin (PRAVACHOL) 40 MG tablet TAKE 1 TABLET BY MOUTH DAILY. 05/03/19 07/30/19  Karie Schwalbe, MD    Family History Family History  Problem Relation Age of Onset   Heart failure Mother    Diabetes Mother    Heart disease Mother    Congestive Heart Failure Mother        cause of death   Cancer Mother        ovarian?   Heart attack Father    Heart attack Sister    Diabetes Sister    Heart attack Brother    Diabetes Brother    Cancer Brother        Bladder   Heart attack Brother    Diabetes Brother    Bladder Cancer Brother    Cancer Maternal Aunt        died of breast cancer   Breast cancer Maternal Aunt    Cancer Maternal Grandmother        died of bone cancer   Bone cancer Maternal Grandmother    Colon cancer Neg Hx    Colon polyps Neg Hx    Esophageal cancer Neg Hx    Rectal cancer Neg Hx    Stomach cancer Neg Hx     Social History Social History   Tobacco Use   Smoking status: Former    Current packs/day: 0.00    Average packs/day: 0.5 packs/day for 37.0 years (18.5 ttl pk-yrs)    Types: Cigarettes    Start date: 07/19/1971    Quit date: 07/18/2008    Years since quitting: 14.6   Smokeless tobacco: Never   Tobacco comments:    No smoking since bypass in 4/10  Vaping Use   Vaping status: Never Used  Substance Use Topics   Alcohol use: Yes    Alcohol/week: 0.0 standard drinks  of alcohol    Comment: rarely   Drug use: No     Allergies   Zolpidem, Cephalexin, Keflin [cephalothin], Latex, Lisinopril, Zolpidem tartrate, and Lamisil [terbinafine]   Review of Systems Review of Systems   Physical Exam Triage Vital Signs ED Triage Vitals  Encounter Vitals Group     BP 02/22/23 1443 (!) 134/92     Systolic BP Percentile --      Diastolic BP Percentile --      Pulse Rate 02/22/23 1443 77     Resp 02/22/23 1443 18     Temp 02/22/23 1443 98.5 F (36.9 C)     Temp Source 02/22/23 1443 Oral     SpO2 02/22/23 1443 96 %     Weight --      Height --      Head Circumference --      Peak Flow --      Pain Score 02/22/23 1450 3     Pain Loc --      Pain Education --      Exclude from Growth Chart --    No data found.  Updated Vital Signs BP (!) 134/92 (BP Location: Left Arm)   Pulse 77   Temp 98.5 F (36.9 C) (Oral)   Resp 18   SpO2 96%   Visual Acuity Right Eye Distance:   Left Eye Distance:   Bilateral Distance:    Right Eye Near:   Left Eye Near:    Bilateral Near:     Physical Exam Constitutional:      Appearance: Normal appearance.  HENT:     Right Ear: Tympanic membrane, ear canal and external ear normal.     Left Ear: Tympanic membrane, ear canal and external ear normal.     Nose: Nose normal.     Mouth/Throat:     Mouth: Mucous membranes are moist.     Pharynx: Oropharynx is clear. No oropharyngeal exudate or posterior oropharyngeal erythema.  Eyes:     Extraocular Movements: Extraocular movements intact.  Cardiovascular:     Rate and Rhythm: Normal rate and regular rhythm.     Pulses: Normal pulses.     Heart sounds: Normal heart sounds.  Pulmonary:     Effort: Pulmonary effort is normal.     Breath sounds: Wheezing present.  Skin:    General: Skin is warm and dry.  Neurological:     Mental Status: He is alert and oriented to person, place, and time. Mental status is at baseline.      UC Treatments / Results   Labs (all labs ordered are listed, but only abnormal results are displayed) Labs Reviewed  POC COVID19/FLU A&B COMBO    EKG   Radiology No results found.  Procedures Procedures (including critical care time)  Medications Ordered in UC Medications - No data to display  Initial Impression / Assessment and Plan / UC Course  I have reviewed the triage vital signs and the nursing notes.  Pertinent labs & imaging results that were available during my care of the patient were reviewed by me and considered in my medical decision making (see chart for details).  Viral URI  Patient is in no signs of distress nor toxic appearing.  Vital signs are stable.  Low suspicion for pneumonia, pneumothorax or bronchitis and therefore will defer imaging.  Wheezing heard to auscultation, O2 saturation 96%, denies presence of shortness of breath, stable for outpatient management.  Prescribed albuterol inhaler.May use additional over-the-counter medications as needed for supportive care.  May follow-up with urgent care as needed if symptoms persist or worsen.   Final Clinical Impressions(s) / UC Diagnoses   Final diagnoses:  Viral URI     Discharge Instructions      Your symptoms today are most likely being caused by a virus and should steadily improve in time it can take up to 7 to 10 days before you truly start to see a turnaround however things will get better  On exam able to hear wheezing to the lungs, you may use albuterol inhaler taking 2 puffs every 4-6 hours as needed if wheezing starts to affect her breathing and you experience shortness of breath, difficulty taking deep breaths etc.  COVID and flu testing negative    You can take Tylenol and/or Ibuprofen as needed for fever reduction and pain relief.   For cough: honey 1/2 to 1 teaspoon (you can dilute the honey in water or another fluid).  You can also use guaifenesin and dextromethorphan for cough. You can use a humidifier for  chest congestion and cough.  If you don't have a humidifier, you can sit in the bathroom with the hot shower running.      For sore throat: try warm salt water gargles, cepacol lozenges, throat spray, warm tea or water with lemon/honey, popsicles or ice, or OTC cold relief medicine for throat discomfort.   For congestion: take a daily anti-histamine like Zyrtec, Claritin, and a oral decongestant, such as pseudoephedrine.  You can also use Flonase 1-2 sprays in each nostril daily.   It is  important to stay hydrated: drink plenty of fluids (water, gatorade/powerade/pedialyte, juices, or teas) to keep your throat moisturized and help further relieve irritation/discomfort.    ED Prescriptions     Medication Sig Dispense Auth. Provider   albuterol (VENTOLIN HFA) 108 (90 Base) MCG/ACT inhaler Inhale 2 puffs into the lungs every 4 (four) hours as needed for wheezing or shortness of breath. 18 g Valinda Hoar, NP      PDMP not reviewed this encounter.   Valinda Hoar, NP 02/22/23 1539

## 2023-02-22 NOTE — Discharge Instructions (Addendum)
Your symptoms today are most likely being caused by a virus and should steadily improve in time it can take up to 7 to 10 days before you truly start to see a turnaround however things will get better  On exam able to hear wheezing to the lungs, you may use albuterol inhaler taking 2 puffs every 4-6 hours as needed if wheezing starts to affect her breathing and you experience shortness of breath, difficulty taking deep breaths etc.  COVID and flu testing negative    You can take Tylenol and/or Ibuprofen as needed for fever reduction and pain relief.   For cough: honey 1/2 to 1 teaspoon (you can dilute the honey in water or another fluid).  You can also use guaifenesin and dextromethorphan for cough. You can use a humidifier for chest congestion and cough.  If you don't have a humidifier, you can sit in the bathroom with the hot shower running.      For sore throat: try warm salt water gargles, cepacol lozenges, throat spray, warm tea or water with lemon/honey, popsicles or ice, or OTC cold relief medicine for throat discomfort.   For congestion: take a daily anti-histamine like Zyrtec, Claritin, and a oral decongestant, such as pseudoephedrine.  You can also use Flonase 1-2 sprays in each nostril daily.   It is important to stay hydrated: drink plenty of fluids (water, gatorade/powerade/pedialyte, juices, or teas) to keep your throat moisturized and help further relieve irritation/discomfort.

## 2023-02-22 NOTE — ED Triage Notes (Signed)
Patient to Urgent Care with complaints of generalized body aches/ fatigue/ chills/ fevers (max 101.5).  Symptoms started Sunday morning.  Negative home Covid test yesterday/ negative for flu A and B.

## 2023-04-06 ENCOUNTER — Ambulatory Visit: Payer: Medicare Other | Admitting: Internal Medicine

## 2023-04-06 ENCOUNTER — Encounter: Payer: Self-pay | Admitting: Internal Medicine

## 2023-04-06 VITALS — BP 120/80 | HR 74 | Temp 98.4°F | Ht 69.0 in | Wt 159.0 lb

## 2023-04-06 DIAGNOSIS — Z0000 Encounter for general adult medical examination without abnormal findings: Secondary | ICD-10-CM | POA: Diagnosis not present

## 2023-04-06 DIAGNOSIS — F39 Unspecified mood [affective] disorder: Secondary | ICD-10-CM | POA: Diagnosis not present

## 2023-04-06 DIAGNOSIS — I2510 Atherosclerotic heart disease of native coronary artery without angina pectoris: Secondary | ICD-10-CM | POA: Diagnosis not present

## 2023-04-06 DIAGNOSIS — Z7984 Long term (current) use of oral hypoglycemic drugs: Secondary | ICD-10-CM

## 2023-04-06 DIAGNOSIS — I10 Essential (primary) hypertension: Secondary | ICD-10-CM

## 2023-04-06 DIAGNOSIS — N1831 Chronic kidney disease, stage 3a: Secondary | ICD-10-CM | POA: Diagnosis not present

## 2023-04-06 DIAGNOSIS — Z125 Encounter for screening for malignant neoplasm of prostate: Secondary | ICD-10-CM | POA: Diagnosis not present

## 2023-04-06 DIAGNOSIS — E1159 Type 2 diabetes mellitus with other circulatory complications: Secondary | ICD-10-CM

## 2023-04-06 LAB — HEPATIC FUNCTION PANEL
ALT: 40 U/L (ref 0–53)
AST: 25 U/L (ref 0–37)
Albumin: 4.8 g/dL (ref 3.5–5.2)
Alkaline Phosphatase: 51 U/L (ref 39–117)
Bilirubin, Direct: 0.2 mg/dL (ref 0.0–0.3)
Total Bilirubin: 0.8 mg/dL (ref 0.2–1.2)
Total Protein: 7.3 g/dL (ref 6.0–8.3)

## 2023-04-06 LAB — CBC
HCT: 44.9 % (ref 39.0–52.0)
Hemoglobin: 15.7 g/dL (ref 13.0–17.0)
MCHC: 34.9 g/dL (ref 30.0–36.0)
MCV: 93.5 fL (ref 78.0–100.0)
Platelets: 160 10*3/uL (ref 150.0–400.0)
RBC: 4.8 Mil/uL (ref 4.22–5.81)
RDW: 13.7 % (ref 11.5–15.5)
WBC: 5.8 10*3/uL (ref 4.0–10.5)

## 2023-04-06 LAB — RENAL FUNCTION PANEL
Albumin: 4.8 g/dL (ref 3.5–5.2)
BUN: 21 mg/dL (ref 6–23)
CO2: 28 meq/L (ref 19–32)
Calcium: 9.5 mg/dL (ref 8.4–10.5)
Chloride: 101 meq/L (ref 96–112)
Creatinine, Ser: 1.37 mg/dL (ref 0.40–1.50)
GFR: 52.65 mL/min — ABNORMAL LOW (ref >60.00)
Glucose, Bld: 125 mg/dL — ABNORMAL HIGH (ref 70–99)
Phosphorus: 2.9 mg/dL (ref 2.3–4.6)
Potassium: 4.8 meq/L (ref 3.5–5.1)
Sodium: 137 meq/L (ref 135–145)

## 2023-04-06 LAB — LIPID PANEL
Cholesterol: 91 mg/dL (ref 0–200)
HDL: 29.1 mg/dL — ABNORMAL LOW (ref >39.00)
LDL Cholesterol: 40 mg/dL (ref 0–99)
NonHDL: 61.93
Total CHOL/HDL Ratio: 3
Triglycerides: 108 mg/dL (ref 0.0–149.0)
VLDL: 21.6 mg/dL (ref 0.0–40.0)

## 2023-04-06 LAB — MICROALBUMIN / CREATININE URINE RATIO
Creatinine,U: 221.9 mg/dL
Microalb Creat Ratio: 3.6 mg/g (ref 0.0–30.0)
Microalb, Ur: 7.9 mg/dL — ABNORMAL HIGH (ref 0.0–1.9)

## 2023-04-06 LAB — HM DIABETES FOOT EXAM

## 2023-04-06 LAB — PSA, MEDICARE: PSA: 1.23 ng/mL (ref 0.10–4.00)

## 2023-04-06 LAB — HEMOGLOBIN A1C: Hgb A1c MFr Bld: 6 % (ref 4.6–6.5)

## 2023-04-06 MED ORDER — ALPRAZOLAM 0.5 MG PO TABS: 0.2500 mg | ORAL_TABLET | Freq: Every day | ORAL | 0 refills | Status: DC

## 2023-04-06 NOTE — Assessment & Plan Note (Signed)
I have personally reviewed the Medicare Annual Wellness questionnaire and have noted 1. The patient's medical and social history 2. Their use of alcohol, tobacco or illicit drugs 3. Their current medications and supplements 4. The patient's functional ability including ADL's, fall risks, home safety risks and hearing or visual             impairment. 5. Diet and physical activities 6. Evidence for depression or mood disorders  The patients weight, height, BMI and visual acuity have been recorded in the chart I have made referrals, counseling and provided education to the patient based review of the above and I have provided the pt with a written personalized care plan for preventive services.  I have provided you with a copy of your personalized plan for preventive services. Please take the time to review along with your updated medication list.  Colon due again 2026 Will check PSA Is exercising Will get COVID and second shingrix at pharmacy

## 2023-04-06 NOTE — Assessment & Plan Note (Signed)
Reactive depression in the past--has resolved Just uses xanax mostly when can't sleep (as well as trazodone)

## 2023-04-06 NOTE — Assessment & Plan Note (Signed)
BP Readings from Last 3 Encounters:  04/06/23 120/80  02/22/23 (!) 134/92  02/07/23 110/80   Variable but generally controlled with losartan and metoprolol

## 2023-04-06 NOTE — Assessment & Plan Note (Signed)
Intermittent borderline low values---will recheck

## 2023-04-06 NOTE — Progress Notes (Signed)
Hearing Screening - Comments:: Passed whisper test Vision Screening - Comments:: 01/28/23

## 2023-04-06 NOTE — Assessment & Plan Note (Signed)
Generally controlled with the metformin 1500mg  daily

## 2023-04-06 NOTE — Progress Notes (Signed)
Subjective:    Patient ID: Joseph [name], male    DOB: 26-May-1955, 67 y.o.   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  Highest was 168/110--usually comes down on recheck Losartan in AM---metoprolol in PM Occasional HA when high No chest pain or SOB Heart races at times---with exertion No dizziness other than with excursions of sugar readings. No syncope  Does check sugars Ranges from 86 to 203 Higher ones are generally not fasting Fastings generally under 120 No foot burning or tingling  Has had borderline low GFR that goes back to 2021 Not every time  Not having reactive depression like in past Some anxiety--but generally just takes the xanax at night  Current Outpatient Medications on File Prior to Visit  Medication Sig Dispense Refill   albuterol (VENTOLIN HFA) 108 (90 Base) MCG/ACT inhaler Inhale 2 puffs into the lungs every 4 (four) hours as needed for wheezing or shortness of breath. 18 g 0   ALPRAZolam (XANAX) 0.5 MG tablet Take 0.5 tablets (0.25 mg total) by mouth at bedtime. 45 tablet 0   aspirin 81 MG EC tablet Take 81 mg by mouth daily.      atorvastatin (LIPITOR) 20 MG tablet TAKE ONE TABLET BY MOUTH DAILY 90 tablet 3   Blood Glucose Monitoring Suppl DEVI One touch Verio monitor Dx code: E11.59 1 each 0    glucose blood (ONETOUCH VERIO) test strip Use to check blood sugar once a day. Dx Code E11.9 100 each 12   ibuprofen (ADVIL,MOTRIN) 200 MG tablet Take 400 mg by mouth 2 (two) times daily as needed (pain).      Lancet Devices (ONE TOUCH DELICA LANCING DEV) MISC Use to obtain blood sample for glucose Dx Code E11.9 1 each 0   losartan (COZAAR) 100 MG tablet Take 1 tablet (100 mg total) by mouth in the morning. 90 tablet 3   metFORMIN (GLUCOPHAGE-XR) 750 MG 24 hr tablet TAKE 2 TABLET (750 MG TOTAL) BY MOUTH DAILY WITH BREAKFAST. 180 tablet 3   metoprolol succinate (TOPROL-XL) 50 MG 24 hr tablet Take 1 tablet (50 mg total) by mouth every evening. Take with or immediately following a meal. 1 tablet 0   nitroGLYCERIN (NITROSTAT) 0.4 MG SL tablet Place 1 tablet (0.4 mg total) under the tongue every 5 (five) minutes as needed for chest pain. 25 tablet 6   Omega-3 Fatty Acids (FISH OIL) 1000 MG CAPS Take 1,000 mg by mouth daily.     omeprazole (PRILOSEC) 40 MG capsule TAKE ONE CAPSULE BY MOUTH DAILY 90 capsule 3   OneTouch Delica Lancets 33G MISC 1 each by Does not apply route daily. Use to obtain sample for glucose level Dx Code E11.9 100 each 3   tiZANidine (ZANAFLEX) 2 MG tablet Take 1 tablet (2 mg total) by mouth 3 (three) times daily as needed for muscle spasms. 30 tablet 1   traZODone (DESYREL) 100 MG tablet TAKE 1 AND 1/2 TABLET BY  MOUTH EVERY NIGHT AT BEDTIME AS NEEDED FOR SLEEP 135 tablet 3   [DISCONTINUED] pravastatin (PRAVACHOL) 40 MG tablet TAKE 1 TABLET BY MOUTH DAILY. 90 tablet 3   No current facility-administered medications on file prior to visit.    Allergies  Allergen Reactions   Zolpidem Other (See Comments)    hallucinate   Cephalexin Hives   Keflin [Cephalothin]     rash   Latex Dermatitis    Bandages and adhesives/ poss to latex   Lisinopril Cough    REACTION: cough   Zolpidem Tartrate     UNKNOWN   Lamisil [Terbinafine] Rash    Rash/itchy    Past Medical History:   Diagnosis Date   Allergy    Anxiety    Arthritis    knees and hip   CAD (coronary artery disease)    s/p emergent 4 V CABG (4/10) w 2/4 patent grafts by cath 08/11/09, patent LIMA to LAD & SVG to OM w moderate disease native RCA & occluded grafts to RCA and diagonal   Cataract    Chest pain    secondary to sternal non-union post CABg-now s/p sternotomy w repair 03/28/09 at [facility], dr. 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 Repeat at [facility] w reported ef of 40%.    Pneumonia 2016   in past    Restless leg syndrome    Schatzki's ring    Type 2 diabetes mellitus, controlled (HCC) 6/15   type 2    Past Surgical History:  Procedure Laterality Date   CATARACT EXTRACTION W/PHACO Right 08/18/2016   Procedure: CATARACT EXTRACTION PHACO AND INTRAOCULAR LENS PLACEMENT (IOC) Right diabetic;  Surgeon: Lockie Mola, MD;  Location: [facility];  Service: Ophthalmology;  Laterality: Right;  Diabetic-oral meds poss Latex allergy   CATARACT EXTRACTION W/PHACO Left 09/15/2016   Procedure: CATARACT EXTRACTION PHACO AND INTRAOCULAR LENS PLACEMENT (IOC)  Left diabetic;  Surgeon: Lockie Mola, MD;  Location: [facility];  Service: Ophthalmology;  Laterality: Left;  Diabetic - oral meds   COLON SURGERY  2014   for perforated colon- diverticulitis   COLONOSCOPY  2003,2010   2010 colon normal- 2003 HPP x 1    CORONARY ANGIOPLASTY  2003   stentx2   CORONARY ARTERY BYPASS GRAFT  08/16/2008   x4   ESOPHAGEAL DILATION   ~2009   Dr Kinnie Scales   ILEOSTOMY  2014   with reversal-- after diverticulitis. Sigmoid colectomy also. Dr Ely/Bird   INGUINAL HERNIA REPAIR Left x2   Dr Orson Slick   LEFT HEART CATH AND CORS/GRAFTS ANGIOGRAPHY N/A 09/27/2019   Procedure: LEFT HEART CATH AND CORS/GRAFTS ANGIOGRAPHY;  Surgeon: Kathleene Hazel, MD;  Location: [facility];  Service: Cardiovascular;  Laterality: N/A;   PILONIDAL CYST EXCISION     pilonidal sinus tract excision     POLYPECTOMY  2007   HPP x 1    STERNUM SEPARATION REPAIR  03/2009   [facility]   TONSILLECTOMY  1984   UPPER GASTROINTESTINAL ENDOSCOPY     VASECTOMY      Family History  Problem Relation Age of Onset   Heart failure Mother    Diabetes Mother    Heart disease Mother    Congestive Heart Failure Mother        cause of death   Cancer Mother        ovarian?   Heart attack Father    Heart attack Sister    Diabetes Sister    Heart attack Brother    Diabetes Brother    Cancer Brother        Bladder   Heart attack Brother    Diabetes Brother    Bladder Cancer Brother    Cancer Maternal Aunt        died of breast cancer   Breast cancer Maternal Aunt    Cancer Maternal Grandmother        died of bone cancer   Bone cancer Maternal Grandmother    Colon cancer Neg Hx    Colon polyps Neg Hx    Esophageal cancer Neg Hx    Rectal cancer Neg Hx    Stomach cancer Neg Hx     Social History   Socioeconomic History   Marital status: Married    Spouse name: Not on file   Number of children: 3   Years of education: Not on file   Highest education level: Some college, no degree  Occupational History   Occupation: Database administrator    Comment: Disabled  Tobacco Use   Smoking status: Former    Current packs/day: 0.00    Average packs/day: 0.5 packs/day for 37.0 years (18.5 ttl pk-yrs)    Types: Cigarettes    Start date: 07/19/1971    Quit date: 07/18/2008    Years since quitting: 14.7   Smokeless tobacco: Never   Tobacco  comments:    No smoking since bypass in 4/10  Vaping Use   Vaping status: Never Used  Substance and Sexual Activity   Alcohol use: Yes    Alcohol/week: 0.0 standard drinks of alcohol    Comment: rarely   Drug use: No   Sexual activity: Not on file  Other Topics Concern   Not on file  Social History Narrative   Has living will   Wife is health care POA---alternate is daughter Lillia Abed   Would accept resuscitation   Not sure about tube feeds--but not prolonged   Social Drivers of Health   Financial Resource Strain: Low Risk  (02/03/2023)   Overall Financial Resource Strain (CARDIA)    Difficulty of Paying Living Expenses: Not hard at all  Food Insecurity: No Food Insecurity (02/03/2023)   Hunger Vital Sign    Worried About Running Out of Food in the Last Year: Never true    Ran Out of Food in the Last Year: Never true  Transportation Needs: No Transportation Needs (02/03/2023)   PRAPARE - Administrator, Civil Service (Medical): No    Lack of Transportation (Non-Medical): No  Physical Activity: Insufficiently Active (02/03/2023)   Exercise Vital Sign    Days of Exercise per Week: 3 days    Minutes of Exercise per Session: 30 min  Stress: No Stress Concern Present (02/03/2023)   [employer] of Occupational Health - Occupational Stress Questionnaire    Feeling of Stress : Only a little  Social Connections: Moderately Integrated (02/03/2023)   Social Connection and Isolation Panel [NHANES]  Frequency of Communication with Friends and Family: More than three times a week    Frequency of Social Gatherings with Friends and Family: Twice a week    Attends Religious Services: 1 to 4 times per year    Active Member of [employer] or Organizations: No    Attends Engineer, structural: Not on file    Marital Status: Married  Catering manager Violence: Not on file   Review of Systems Appetite is good Weight stable Some chronic insomnia--has been better lately.  Uses trazodone and xanax Wears seat belt Teeth are fine--overdue for dentist Rare heartburn on daily omeprazole. No dysphagia Bowels move fine--no blood Voids okay--stream is okay Some chronic back and joint pains--voltaren gel does help No suspicious skin lesions    Objective:   Physical Exam Constitutional:      Appearance: Normal appearance.  HENT:     Mouth/Throat:     Pharynx: No oropharyngeal exudate or posterior oropharyngeal erythema.  Eyes:     Conjunctiva/sclera: Conjunctivae normal.     Pupils: Pupils are equal, round, and reactive to light.  Cardiovascular:     Rate and Rhythm: Normal rate and regular rhythm.     Pulses: Normal pulses.     Heart sounds: No murmur heard.    No gallop.  Pulmonary:     Effort: Pulmonary effort is normal.     Breath sounds: Normal breath sounds. No wheezing or rales.  Abdominal:     Palpations: Abdomen is soft.     Tenderness: There is no abdominal tenderness.  Musculoskeletal:     Cervical back: Neck supple.     Right lower leg: No edema.     Left lower leg: No edema.  Lymphadenopathy:     Cervical: No cervical adenopathy.  Skin:    Findings: No lesion or rash.     Comments: No foot lesions  Neurological:     General: No focal deficit present.     Mental Status: Joseph [name] is alert and oriented to person, place, and time.     Comments: Word naming---14/1 minute Recall 3/3 Normal sensation in feet  Psychiatric:        Mood and Affect: Mood normal.        Behavior: Behavior normal.            Assessment & Plan:

## 2023-04-06 NOTE — Assessment & Plan Note (Signed)
No symptoms on the ASA 81, metoprolol 50, losatan 100 and pravastatin 40mg  daily

## 2023-04-08 ENCOUNTER — Telehealth: Payer: Self-pay | Admitting: *Deleted

## 2023-04-08 NOTE — Telephone Encounter (Signed)
Tried to call pt's wife on number provided. No answer and no VM to leave a message.

## 2023-04-08 NOTE — Telephone Encounter (Signed)
Copied from CRM [id number]. Topic: General - Call Back - No Documentation >> Apr 08, 2023 12:14 PM Isabell A wrote: Reason for CRM: Spouse calling to speak with Carollee Herter, would not disclose any other information.

## 2023-04-14 NOTE — Telephone Encounter (Signed)
Spoke to pt. Was having trouble getting in to MyChart. But, that has fixed itself.

## 2023-04-17 DIAGNOSIS — Z23 Encounter for immunization: Secondary | ICD-10-CM | POA: Diagnosis not present

## 2023-04-18 ENCOUNTER — Telehealth: Payer: Self-pay

## 2023-04-18 NOTE — Telephone Encounter (Signed)
Copied from CRM #[id number]. Topic: Clinical - Medical Advice >> Apr 15, 2023  4:18 PM Taleah C wrote: Reason for CRM: Patient called and asked for someone in clinic to verify if he is able to get the Covid booster Novax or Cominaty. Please call and advise patient.

## 2023-04-18 NOTE — Telephone Encounter (Signed)
Left message on VM per DPR with Dr Alla German message

## 2023-04-19 ENCOUNTER — Encounter: Payer: Self-pay | Admitting: Internal Medicine

## 2023-05-29 ENCOUNTER — Other Ambulatory Visit: Payer: Self-pay | Admitting: Internal Medicine

## 2023-06-27 ENCOUNTER — Telehealth: Payer: Self-pay | Admitting: Internal Medicine

## 2023-06-27 NOTE — Telephone Encounter (Signed)
 Copied from CRM [id number]. Topic: General - Other >> Jun 27, 2023  2:29 PM Elizebeth Brooking wrote: Reason for CRM: Patient and Wife called in regarding the email regarding the lab results, She stated that she would like for someone to give her a callback explaining what is going with this   [phone]

## 2023-06-27 NOTE — Telephone Encounter (Signed)
 Called back and spoke to pt and his wife. They were calling in regards to the urine creatinine ratio issue that was found recently. I advised that I thought he could wait until his appointment in June to retest. They wanted to know Dr Karle Starch opinion. They are aware he is out of the office.

## 2023-06-28 NOTE — Telephone Encounter (Signed)
 Vernona Rieger, I spoke to Dr Alphonsus Sias. Let him know I have the list of patients.

## 2023-07-07 ENCOUNTER — Telehealth: Payer: Self-pay | Admitting: Internal Medicine

## 2023-07-07 NOTE — Telephone Encounter (Signed)
 Phone call with patient and wife about the urine microal lab error. Discussed that it would be expected to have elevated protein in the urine since he has known mild CKD from diabetes presumably. Is already on losartan  Discussed that given his excellent diabetes control, I would not recommend an SGLT-2 inhibitor.

## 2023-07-09 ENCOUNTER — Other Ambulatory Visit: Payer: Self-pay | Admitting: Internal Medicine

## 2023-07-17 ENCOUNTER — Other Ambulatory Visit: Payer: Self-pay | Admitting: Internal Medicine

## 2023-07-18 NOTE — Telephone Encounter (Signed)
 Last filled 04-06-23 #45 Last filled 04-06-23 Next OV 10-05-23 [facility]

## 2023-08-10 ENCOUNTER — Telehealth: Payer: Self-pay

## 2023-08-10 MED ORDER — TRAZODONE HCL 100 MG PO TABS: ORAL_TABLET | ORAL | 3 refills | Status: DC

## 2023-08-10 NOTE — Telephone Encounter (Signed)
 Rx sent electronically.

## 2023-08-14 ENCOUNTER — Other Ambulatory Visit: Payer: Self-pay | Admitting: Internal Medicine

## 2023-10-05 ENCOUNTER — Encounter: Payer: Self-pay | Admitting: Internal Medicine

## 2023-10-05 ENCOUNTER — Encounter: Payer: Medicare Other | Admitting: Internal Medicine

## 2023-10-05 ENCOUNTER — Ambulatory Visit (INDEPENDENT_AMBULATORY_CARE_PROVIDER_SITE_OTHER): Payer: Medicare Other | Admitting: Internal Medicine

## 2023-10-05 VITALS — BP 122/80 | HR 68 | Temp 98.0°F | Ht 69.0 in | Wt 158.0 lb

## 2023-10-05 DIAGNOSIS — I10 Essential (primary) hypertension: Secondary | ICD-10-CM

## 2023-10-05 DIAGNOSIS — E1159 Type 2 diabetes mellitus with other circulatory complications: Secondary | ICD-10-CM

## 2023-10-05 DIAGNOSIS — Z7984 Long term (current) use of oral hypoglycemic drugs: Secondary | ICD-10-CM | POA: Diagnosis not present

## 2023-10-05 DIAGNOSIS — I2510 Atherosclerotic heart disease of native coronary artery without angina pectoris: Secondary | ICD-10-CM | POA: Diagnosis not present

## 2023-10-05 DIAGNOSIS — M1812 Unilateral primary osteoarthritis of first carpometacarpal joint, left hand: Secondary | ICD-10-CM | POA: Diagnosis not present

## 2023-10-05 LAB — POCT GLYCOSYLATED HEMOGLOBIN (HGB A1C): Hemoglobin A1C: 5.7 % — AB (ref 4.0–5.6)

## 2023-10-05 MED ORDER — BLOOD GLUCOSE MONITORING SUPPL DEVI: 0 refills | Status: AC

## 2023-10-05 NOTE — Assessment & Plan Note (Signed)
 No symptoms on ASA 81, atorvastatin  20, metoprolol  50 daily, losartan  100 daily

## 2023-10-05 NOTE — Assessment & Plan Note (Signed)
 BP Readings from Last 3 Encounters:  10/05/23 122/80  04/06/23 120/80  02/22/23 (!) 134/92   Good control on heart meds

## 2023-10-05 NOTE — Assessment & Plan Note (Signed)
 Discussed using diclofenac gel on this

## 2023-10-05 NOTE — Assessment & Plan Note (Signed)
 Lab Results  Component Value Date   HGBA1C 5.7 (A) 10/05/2023   Doing well on metformin  750mg  bid

## 2023-10-05 NOTE — Progress Notes (Signed)
 Subjective:    Patient ID: Joseph [name], male    DOB: 31-May-1955, 68 y.o.   MRN: [id number]  HPI Here for follow up of diabetes and other chronic health conditions With wife  Checks sugars daily Runs 78-- 171. Rarely over 130 No foot numbness or burning  Checks BP daily also Typical is 130/80 No chest pain or SOB Not much exercise lately---moved and no longer has stairs No dizziness or syncope No edema No palpitations  GFR in the 50's over the past few years  Current Outpatient Medications on File Prior to Visit  Medication Sig Dispense Refill   albuterol  (VENTOLIN  HFA) 108 (90 Base) MCG/ACT inhaler Inhale 2 puffs into the lungs every 4 (four) hours as needed for wheezing or shortness of breath. 18 g 0   ALPRAZolam  (XANAX ) 0.5 MG tablet TAKE ONE-HALF TABLET BY MOUTH AT BEDTIME 45 tablet 0   aspirin  81 MG EC tablet Take 81 mg by mouth daily.      atorvastatin  (LIPITOR) 20 MG tablet TAKE ONE TABLET BY MOUTH ONE TIME DAILY 90 tablet 3   Blood Glucose Monitoring Suppl DEVI One touch Verio monitor Dx code: E11.59 1 each 0   glucose blood (ONETOUCH VERIO) test strip Use to check blood sugar once a day. Dx Code E11.9 100 each 12   ibuprofen (ADVIL,MOTRIN) 200 MG tablet Take 400 mg by mouth 2 (two) times daily as needed (pain).      Lancet Devices (ONE TOUCH DELICA LANCING DEV) MISC Use to obtain blood sample for glucose Dx Code E11.9 1 each 0   losartan  (COZAAR ) 100 MG tablet Take 1 tablet (100 mg total) by mouth in the morning. 90 tablet 3   metFORMIN  (GLUCOPHAGE -XR) 750 MG 24 hr tablet TAKE 2 TABLET (750 MG TOTAL) BY MOUTH DAILY WITH BREAKFAST. 180 tablet 3   metoprolol  succinate (TOPROL -XL) 50 MG 24 hr tablet TAKE ONE TABLET BY MOUTH ONE TIME DAILY WITH OR IMMEDIATELY FOLLOWING A MEAL 90 tablet 1   nitroGLYCERIN  (NITROSTAT ) 0.4 MG SL tablet Place 1 tablet (0.4 mg total) under the tongue every 5 (five) minutes as needed for chest pain. 25 tablet 6   Omega-3 Fatty Acids (FISH OIL)  1000 MG CAPS Take 1,000 mg by mouth daily.     omeprazole  (PRILOSEC) 40 MG capsule TAKE ONE CAPSULE BY MOUTH ONE TIME DAILY 90 capsule 3   OneTouch Delica Lancets 33G MISC 1 each by Does not apply route daily. Use to obtain sample for glucose level Dx Code E11.9 100 each 3   tiZANidine  (ZANAFLEX ) 2 MG tablet Take 1 tablet (2 mg total) by mouth 3 (three) times daily as needed for muscle spasms. 30 tablet 1   traZODone  (DESYREL ) 100 MG tablet TAKE 1 AND 1/2 TABLET BY MOUTH EVERY NIGHT AT BEDTIME AS NEEDED FOR SLEEP 135 tablet 3   [DISCONTINUED] pravastatin  (PRAVACHOL ) 40 MG tablet TAKE 1 TABLET BY MOUTH DAILY. 90 tablet 3   No current facility-administered medications on file prior to visit.    Allergies  Allergen Reactions   Zolpidem Other (See Comments)    hallucinate   Cephalexin Hives   Keflin [Cephalothin]     rash   Latex Dermatitis    Bandages and adhesives/ poss to latex   Lisinopril Cough    REACTION: cough   Zolpidem Tartrate     UNKNOWN   Lamisil [Terbinafine] Rash    Rash/itchy    Past Medical History:  Diagnosis Date   Allergy  Anxiety    Arthritis    knees and hip   CAD (coronary artery disease)    s/p emergent 4 V CABG (4/10) w 2/4 patent grafts by cath 08/11/09, patent LIMA to LAD & SVG to OM w moderate disease native RCA & occluded grafts to RCA and diagonal   Cataract    Chest pain    secondary to sternal non-union post CABg-now s/p sternotomy w repair 03/28/09 at [facility], dr. 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 Repeat at [facility] w reported ef of 40%.    Pneumonia 2016   in past    Restless leg syndrome    Schatzki's ring    Type 2 diabetes mellitus, controlled (HCC) 6/15   type 2    Past Surgical History:  Procedure Laterality Date   CATARACT EXTRACTION W/PHACO Right 08/18/2016   Procedure: CATARACT EXTRACTION PHACO AND INTRAOCULAR LENS PLACEMENT (IOC) Right diabetic;  Surgeon: Annell Kidney, MD;  Location: [facility];  Service: Ophthalmology;  Laterality: Right;  Diabetic-oral meds poss Latex allergy   CATARACT EXTRACTION W/PHACO Left 09/15/2016   Procedure: CATARACT EXTRACTION PHACO AND INTRAOCULAR LENS PLACEMENT (IOC)  Left diabetic;  Surgeon: Annell Kidney, MD;  Location: [facility];  Service: Ophthalmology;  Laterality: Left;  Diabetic - oral meds   COLON SURGERY  2014   for perforated colon- diverticulitis   COLONOSCOPY  2003,2010   2010 colon normal- 2003 HPP x 1    CORONARY ANGIOPLASTY  2003   stentx2   CORONARY ARTERY BYPASS GRAFT  08/16/2008   x4   ESOPHAGEAL DILATION  ~2009   Dr Andriette Keeling   ILEOSTOMY  2014   with reversal-- after diverticulitis. Sigmoid colectomy also. Dr Ely/Bird   INGUINAL HERNIA REPAIR Left x2   Dr Al Alias   LEFT HEART CATH AND CORS/GRAFTS ANGIOGRAPHY N/A 09/27/2019   Procedure: LEFT HEART CATH AND CORS/GRAFTS ANGIOGRAPHY;  Surgeon: Odie Benne, MD;  [facility];  Service: Cardiovascular;  Laterality: N/A;   PILONIDAL CYST EXCISION     pilonidal sinus tract excision     POLYPECTOMY  2007   HPP x 1    STERNUM SEPARATION REPAIR  03/2009   [facility]   TONSILLECTOMY  1984   UPPER GASTROINTESTINAL ENDOSCOPY     VASECTOMY      Family History  Problem Relation Age of Onset   Heart failure Mother    Diabetes Mother    Heart disease Mother    Congestive Heart Failure Mother        cause of death   Cancer Mother        ovarian?   Heart attack Father    Heart attack Sister    Diabetes  Sister    Heart attack Brother    Diabetes Brother    Cancer Brother        Bladder   Heart attack Brother    Diabetes Brother    Bladder Cancer  Brother    Cancer Maternal Aunt        died of breast cancer   Breast cancer Maternal Aunt    Cancer Maternal Grandmother        died of bone cancer   Bone cancer Maternal Grandmother    Colon cancer Neg Hx    Colon polyps Neg Hx    Esophageal cancer Neg Hx    Rectal cancer Neg Hx    Stomach cancer Neg Hx     Social History   Socioeconomic History   Marital status: Married    Spouse name: Not on file   Number of children: 3   Years of education: Not on file   Highest education level: Some college, no degree  Occupational History   Occupation: Database administrator    Comment: Disabled  Tobacco Use   Smoking status: Former    Current packs/day: 0.00    Average packs/day: 0.5 packs/day for 37.0 years (18.5 ttl pk-yrs)    Types: Cigarettes    Start date: 07/19/1971    Quit date: 07/18/2008    Years since quitting: 15.2   Smokeless tobacco: Never   Tobacco comments:    No smoking since bypass in 4/10  Vaping Use   Vaping status: Never Used  Substance and Sexual Activity   Alcohol use: Yes    Alcohol/week: 0.0 standard drinks of alcohol    Comment: rarely   Drug use: No   Sexual activity: Not on file  Other Topics Concern   Not on file  Social History Narrative   Has living will   Wife is health care POA---alternate is daughter Heidi Llamas   Would accept resuscitation   Not sure about tube feeds--but not prolonged   Social Drivers of Health   Financial Resource Strain: Low Risk  (09/30/2023)   Overall Financial Resource Strain (CARDIA)    Difficulty of Paying Living Expenses: Not very hard  Food Insecurity: No Food Insecurity (09/30/2023)   Hunger Vital Sign    Worried About Running Out of Food in the Last Year: Never true    Ran Out of Food in the Last Year: Never true  Transportation Needs: No Transportation  Needs (09/30/2023)   PRAPARE - Administrator, Civil Service (Medical): No    Lack of Transportation (Non-Medical): No  Physical Activity: Insufficiently Active (09/30/2023)   Exercise Vital Sign    Days of Exercise per Week: 2 days    Minutes of Exercise per Session: 20 min  Stress: Stress Concern Present (09/30/2023)   [employer] of Occupational Health - Occupational Stress Questionnaire    Feeling of Stress: To some extent  Social Connections: Moderately Isolated (09/30/2023)   Social Connection and Isolation Panel    Frequency of Communication with Friends and Family: Once a week    Frequency of Social Gatherings with Friends and Family: Once a week    Attends Religious Services: 1 to 4 times per year    Active Member of [employer] or Organizations: No    Attends Engineer, structural: Not on file    Marital Status: Married  Catering manager Violence: Not on file   Review of Systems Sleeps okay--but often stays up late for TV Appetite is okay     Objective:   Physical Exam Constitutional:      Appearance: Normal appearance.   Cardiovascular:     Rate and Rhythm: Normal rate and regular rhythm.     Pulses: Normal pulses.  Heart sounds: No murmur heard.    No gallop.  Pulmonary:     Effort: Pulmonary effort is normal.     Breath sounds: Normal breath sounds. No wheezing or rales.   Musculoskeletal:     Cervical back: Neck supple.     Right lower leg: No edema.     Left lower leg: No edema.     Comments: Mild tenderness at left [facility]  Lymphadenopathy:     Cervical: No cervical adenopathy.   Skin:    Comments: No foot lesions   Neurological:     Mental Status: He is alert.   Psychiatric:        Mood and Affect: Mood normal.        Behavior: Behavior normal.            Assessment & Plan:

## 2023-10-16 ENCOUNTER — Other Ambulatory Visit: Payer: Self-pay | Admitting: Internal Medicine

## 2023-10-17 NOTE — Telephone Encounter (Signed)
 Last written 07-18-23 #45 Last OV 10-05-23 No Future OV  [employer]

## 2023-10-30 NOTE — Progress Notes (Unsigned)
 No chief complaint on file.  History of Present Illness: 68 yo male with history of CAD s/p 4V CABG, DM, GERD and nephrolithiasis who is here today for cardiac follow up. He was admitted to [facility] in April 2010 with an anterior STEMI. Emergent cath demonstrated a patent prior stent in the ostial LAD extending into the mid LAD. There was a hazy 99% stenosis in the mid LAD that was felt to be the culprit. PCI failed after multiple attempts, followed by ventricular fibrillation and the pt was taken emergently to the operating room for bypass. Emergent CABG was performed by Dr. Kerrin with 4 vessels bypassed (LIMA to LAD, SVG to D2, SVG to OM2, SVG to distal RCA). After surgery, he had problems with chest wall pain and went to [facility] for evaluation by CT surgery. He underwent sternotomy with removal of sternal wires and revision by Dr. Burnie on 03/28/09. He was admitted to [facility] April 2011 with chest pain. Cardiac cath repeated and showed patent LIMA to LAD, patent SVG to OM with occluded SVG to Diagonal and occluded SVG to RCA. EF was 45%. He did well over the next ten years. I saw him April 2021 and he c/o resting chest pain. Exercise stress test May 2021 with exercise induced polymorphic VT. He was seen by Dr. Fernande who recommended cardiac cath and restarting his beta blocker. Cardiac cath May 2021 with 2/4 patent bypass grafts, chronic occlusion of the mid LAD and moderate proximal RCA stenosis that was unchanged from last cath. The vein graft to the RCA is known to be occluded. Echo May 2021 with LVEF=55-60%. No valve disease.   He is here today for follow up. The patient denies any chest pain, dyspnea, palpitations, lower extremity edema, orthopnea, PND, dizziness, near syncope or syncope.    Primary Care Physician: Jimmy Charlie FERNS, MD  Past Medical History:  Diagnosis Date   Allergy    Anxiety    Arthritis    knees and hip   CAD (coronary artery disease)    s/p emergent 4  V CABG (4/10) w 2/4 patent grafts by cath 08/11/09, patent LIMA to LAD & SVG to OM w moderate disease native RCA & occluded grafts to RCA and diagonal   Cataract    Chest pain    secondary to sternal non-union post CABg-now s/p sternotomy w repair 03/28/09 at [facility], dr. 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) [id number]   Nephrolithiasis    Normal echocardiogram 5/10   EF 60, apical & apicallateral HK, PASP . Repeat at [facility] w reported ef of 40%.    Pneumonia 2016   in past    Restless leg syndrome    Schatzki's ring    Type 2 diabetes mellitus, controlled (HCC) 6/15   type 2    Past Surgical History:  Procedure Laterality Date   CATARACT EXTRACTION W/PHACO Right 08/18/2016   Procedure: CATARACT EXTRACTION PHACO AND INTRAOCULAR LENS PLACEMENT (IOC) Right diabetic;  Surgeon: Dene Etienne, MD;  Location: [facility];  Service: Ophthalmology;  Laterality: Right;  Diabetic-oral meds poss Latex allergy   CATARACT EXTRACTION W/PHACO Left 09/15/2016   Procedure: CATARACT EXTRACTION PHACO AND INTRAOCULAR LENS PLACEMENT (IOC)  Left diabetic;  Surgeon: Mittie Gaskin, MD;  Location: [facility];  Service: Ophthalmology;  Laterality: Left;  Diabetic - oral meds   COLON SURGERY  2014   for perforated colon- diverticulitis   COLONOSCOPY  2003,2010   2010 colon normal- 2003 HPP x 1    CORONARY ANGIOPLASTY  2003   stentx2   CORONARY ARTERY BYPASS GRAFT  08/16/2008   x4   ESOPHAGEAL DILATION  ~2009   Dr Luis   ILEOSTOMY  2014   with reversal-- after diverticulitis. Sigmoid colectomy also. Dr Ely/Bird    INGUINAL HERNIA REPAIR Left x2   Dr Nettie   LEFT HEART CATH AND CORS/GRAFTS ANGIOGRAPHY N/A 09/27/2019   Procedure: LEFT HEART CATH AND CORS/GRAFTS ANGIOGRAPHY;  Surgeon: Verlin Lonni BIRCH, MD;  [facility]: [facility];  Service: Cardiovascular;  Laterality: N/A;   PILONIDAL CYST EXCISION     pilonidal sinus tract excision     POLYPECTOMY  2007   HPP x 1    STERNUM SEPARATION REPAIR  03/2009   [facility]   TONSILLECTOMY  1984   UPPER GASTROINTESTINAL ENDOSCOPY     VASECTOMY      Current Outpatient Medications  Medication Sig Dispense Refill   albuterol  (VENTOLIN  HFA) 108 (90 Base) MCG/ACT inhaler Inhale 2 puffs into the lungs every 4 (four) hours as needed for wheezing or shortness of breath. 18 g 0   ALPRAZolam  (XANAX ) 0.5 MG tablet TAKE ONE-HALF TABLET BY MOUTH AT BEDTIME 45 tablet 0   aspirin  81 MG EC tablet Take 81 mg by mouth daily.      atorvastatin  (LIPITOR) 20 MG tablet TAKE ONE TABLET BY MOUTH ONE TIME DAILY 90 tablet 3   Blood Glucose Monitoring Suppl DEVI One touch Verio monitor Dx code: E11.59 1 each 0   glucose blood (ONETOUCH VERIO) test strip Use to check blood sugar once a day. Dx Code E11.9 100 each 12   ibuprofen (ADVIL,MOTRIN) 200 MG tablet Take 400 mg by mouth 2 (two) times daily as needed (pain).      Lancet Devices (ONE TOUCH DELICA LANCING DEV) MISC Use to obtain blood sample for glucose Dx Code E11.9 1 each 0   losartan  (COZAAR ) 100 MG tablet Take 1 tablet (100 mg total) by mouth in the morning. 90 tablet 3   metFORMIN  (GLUCOPHAGE -XR) 750 MG 24 hr tablet TAKE 2 TABLET (750 MG TOTAL) BY MOUTH DAILY WITH BREAKFAST. 180 tablet 3   metoprolol  succinate (TOPROL -XL) 50 MG 24 hr tablet TAKE ONE TABLET BY MOUTH ONE TIME DAILY WITH OR IMMEDIATELY FOLLOWING A MEAL 90 tablet 1   nitroGLYCERIN  (NITROSTAT ) 0.4 MG SL tablet Place 1 tablet (0.4 mg total) under the tongue every 5 (five) minutes as needed for chest pain. 25 tablet 6   Omega-3 Fatty Acids (FISH OIL) 1000 MG  CAPS Take 1,000 mg by mouth daily.     omeprazole  (PRILOSEC) 40 MG capsule TAKE ONE CAPSULE BY MOUTH ONE TIME DAILY 90 capsule 3   OneTouch Delica Lancets 33G MISC 1 each by Does not apply route daily. Use to obtain sample for glucose level Dx Code E11.9 100 each 3   tiZANidine  (ZANAFLEX ) 2 MG tablet Take 1 tablet (2 mg total) by mouth 3 (three) times daily as needed for muscle spasms. 30 tablet 1   traZODone  (DESYREL ) 100 MG tablet TAKE 1  AND 1/2 TABLET BY MOUTH EVERY NIGHT AT BEDTIME AS NEEDED FOR SLEEP 135 tablet 3   No current facility-administered medications for this visit.    Allergies  Allergen Reactions   Zolpidem Other (See Comments)    hallucinate   Cephalexin Hives   Keflin [Cephalothin]     rash   Latex Dermatitis    Bandages and adhesives/ poss to latex   Lisinopril Cough    REACTION: cough   Zolpidem Tartrate     UNKNOWN   Lamisil [Terbinafine] Rash    Rash/itchy    History   Social History   Marital Status: Married    Spouse Name: N/A    Number of Children: 3   Years of Education: N/A   Occupational History   Not on file.   Social History Main Topics   Smoking status: Former Smoker    Quit date: 07/18/2008   Smokeless tobacco: Not on file     Comment: No smoking since bypass in 4/10   Alcohol Use: No        Drug Use: No   Sexually Active: Not on file   Other Topics Concern   Not on file   Social History Narrative       Family History  Problem Relation Age of Onset   Heart failure Mother    Diabetes Mother    Heart disease Mother    Congestive Heart Failure Mother        cause of death   Cancer Mother        ovarian?   Heart attack Father    Heart attack Sister    Diabetes Sister    Heart attack Brother    Diabetes Brother    Cancer Brother        Bladder   Heart attack Brother    Diabetes Brother    Bladder Cancer Brother    Cancer Maternal Aunt        died of breast cancer   Breast cancer Maternal Aunt    Cancer Maternal  Grandmother        died of bone cancer   Bone cancer Maternal Grandmother    Colon cancer Neg Hx    Colon polyps Neg Hx    Esophageal cancer Neg Hx    Rectal cancer Neg Hx    Stomach cancer Neg Hx     Review of Systems:  As stated in the HPI and otherwise negative.   There were no vitals taken for this visit.  Physical Examination: General: Well developed, well nourished, NAD  HEENT: OP clear, mucus membranes moist  SKIN: warm, dry. No rashes. Neuro: No focal deficits  Musculoskeletal: Muscle strength 5/5 all ext  Psychiatric: Mood and affect normal  Neck: No JVD, no carotid bruits, no thyromegaly, no lymphadenopathy.  Lungs:Clear bilaterally, no wheezes, rhonci, crackles Cardiovascular: Regular rate and rhythm. No murmurs, gallops or rubs. Abdomen:Soft. Bowel sounds present. Non-tender.  Extremities: No lower extremity edema. Pulses are 2 + in the bilateral DP/PT.  EKG:  EKG is *** ordered today. The ekg ordered today demonstrates    Recent Labs: 04/06/2023: ALT 40; BUN 21; Creatinine, Ser 1.37; Hemoglobin 15.7; Platelets 160.0; Potassium 4.8; Sodium 137   Lipid Panel    Component Value Date/Time   CHOL 91 04/06/2023 1009   CHOL 92 09/14/2012 1521   TRIG 108.0 04/06/2023 1009   TRIG 230 (H) 09/14/2012 1521   HDL 29.10 (L) 04/06/2023 1009   HDL 22 (L) 09/14/2012  1521   CHOLHDL 3 04/06/2023 1009   VLDL 21.6 04/06/2023 1009   VLDL 46 (H) 09/14/2012 1521   LDLCALC 40 04/06/2023 1009   LDLCALC 24 09/14/2012 1521   LDLDIRECT 67.0 02/23/2019 1228     Wt Readings from Last 3 Encounters:  10/05/23 158 lb (71.7 kg)  04/06/23 159 lb (72.1 kg)  02/07/23 160 lb (72.6 kg)    Assessment and Plan:   1. CAD s/p CABG without angina: No chest pain. Continue ASA, beta blocker and statin. LDL 40 in December 2024.   2. Tobacco abuse, in remission: No tobacco use since 2010.   3. HTN: BP is well controlled. Continue current therapy as outlined above.   4. Non-sustained  ventricular tachycardia: No palpitations. Continue beta blocker.   Labs/ tests ordered today include:  No orders of the defined types were placed in this encounter.  Disposition:   F/U with me in 12 months  Signed, Lonni Cash, MD 10/30/2023 10:59 AM    [facility] [address] [place], [place], [place]  [address] Phone: [phone]; Fax: [phone]

## 2023-10-31 ENCOUNTER — Encounter: Payer: Self-pay | Admitting: Cardiovascular Disease

## 2023-10-31 ENCOUNTER — Ambulatory Visit: Attending: Cardiovascular Disease | Admitting: Cardiovascular Disease

## 2023-10-31 VITALS — BP 124/78 | HR 74 | Ht 69.0 in | Wt 158.0 lb

## 2023-10-31 DIAGNOSIS — F5221 Male erectile disorder: Secondary | ICD-10-CM | POA: Diagnosis not present

## 2023-10-31 DIAGNOSIS — I472 Ventricular tachycardia, unspecified: Secondary | ICD-10-CM | POA: Diagnosis not present

## 2023-10-31 DIAGNOSIS — I10 Essential (primary) hypertension: Secondary | ICD-10-CM | POA: Insufficient documentation

## 2023-10-31 DIAGNOSIS — I25810 Atherosclerosis of coronary artery bypass graft(s) without angina pectoris: Secondary | ICD-10-CM | POA: Insufficient documentation

## 2023-10-31 MED ORDER — SILDENAFIL CITRATE 50 MG PO TABS: 50.0000 mg | ORAL_TABLET | Freq: Every day | ORAL | 2 refills | Status: AC | PRN

## 2023-10-31 NOTE — Patient Instructions (Signed)
 Medication Instructions:  As needed, Sildenafil  (Viagra ) 50 mg once a day.   *If you need a refill on your cardiac medications before your next appointment, please call your pharmacy*  Follow-Up: At [facility], you and your health needs are our priority.  As part of our continuing mission to provide you with exceptional heart care, our providers are all part of one team.  This team includes your primary Cardiologist (physician) and Advanced Practice Providers or APPs (Physician Assistants and Nurse Practitioners) who all work together to provide you with the care you need, when you need it.  Your next appointment:   1 year(s)  Provider:   Lonni Cash, MD    We recommend signing up for the patient portal called MyChart.  Sign up information is provided on this After Visit Summary.  MyChart is used to connect with patients for Virtual Visits (Telemedicine).  Patients are able to view lab/test results, encounter notes, upcoming appointments, etc.  Non-urgent messages can be sent to your provider as well.   To learn more about what you can do with MyChart, go to [email or web address].

## 2023-11-04 ENCOUNTER — Ambulatory Visit: Payer: Self-pay

## 2023-11-04 NOTE — Telephone Encounter (Signed)
 FYI Only or Action Required?: FYI only for provider.  Patient was last seen in primary care on 10/05/2023 by Joseph Charlie FERNS, MD.  Called Nurse Triage reporting Hand Pain.  Symptoms began several months ago.  Interventions attempted: OTC medications: ibuprofen.  Symptoms are: gradually worsening.  Triage Disposition: See PCP Within 2 Weeks (overriding See PCP When Office is Open (Within 3 Days))  Patient/caregiver understands and will follow disposition?: Yes                             Copied from CRM [id number]. Topic: Clinical - Red Word Triage >> Nov 04, 2023 11:20 AM Carlyon D wrote: Red Word that prompted transfer to Nurse Triage: Severe pain in the joint of his left hand, pain is continuing to get worse. Hand is also swelling as well. Reason for Disposition  [1] MODERATE pain (e.g., interferes with normal activities) AND [2] present > 3 days  Answer Assessment - Initial Assessment Questions 1. ONSET: When did the pain start?     A few months, worsening  2. LOCATION: Where is the pain located?     Left hand 3. PAIN: How bad is the pain? (Scale 1-10; or mild, moderate, severe)     Rates pain a 5 4. WORK OR EXERCISE: Has there been any recent work or exercise that involved this part (i.e., hand or wrist) of the body?     States he had a household move around the time the pain increased  5. CAUSE: What do you think is causing the pain?     Originally thought to be arthritis  6. AGGRAVATING FACTORS: What makes the pain worse? (e.g., using computer)     Overuse of hand 7. OTHER SYMPTOMS: Do you have any other symptoms? (e.g., fever, neck pain, numbness or tingling, rash, swelling)     Swelling, denies numbness/tingling, states he is still able to use the hand normally    No available appointments until Wednesday of next week. This RN scheduled first available appointment. Advised patient to call back if symptoms worsen.  Protocols used:  Hand Pain-A-AH

## 2023-11-09 ENCOUNTER — Ambulatory Visit (INDEPENDENT_AMBULATORY_CARE_PROVIDER_SITE_OTHER)
Admission: RE | Admit: 2023-11-09 | Discharge: 2023-11-09 | Disposition: A | Discharge status: Home / Self Care | Source: Ambulatory Visit | Attending: Primary Care | Admitting: Primary Care

## 2023-11-09 ENCOUNTER — Ambulatory Visit: Admitting: Primary Care

## 2023-11-09 ENCOUNTER — Encounter: Payer: Self-pay | Admitting: Primary Care

## 2023-11-09 VITALS — BP 136/78 | HR 67 | Temp 96.8°F | Ht 69.0 in | Wt 160.0 lb

## 2023-11-09 DIAGNOSIS — M19042 Primary osteoarthritis, left hand: Secondary | ICD-10-CM | POA: Diagnosis not present

## 2023-11-09 DIAGNOSIS — M1812 Unilateral primary osteoarthritis of first carpometacarpal joint, left hand: Secondary | ICD-10-CM | POA: Diagnosis not present

## 2023-11-09 DIAGNOSIS — M25542 Pain in joints of left hand: Secondary | ICD-10-CM | POA: Diagnosis not present

## 2023-11-09 MED ORDER — PREDNISONE 20 MG PO TABS
ORAL_TABLET | ORAL | 0 refills | Status: DC
Start: 2023-11-09 — End: 2024-01-30

## 2023-11-09 NOTE — Progress Notes (Signed)
 Subjective:    Patient ID: Joseph [name], male    DOB: July 03, 1955, 68 y.o.   MRN: [id number]  Hand Pain  Pertinent negatives include no numbness.    Joseph [name] is a very pleasant 68 y.o. male patient of Dr. Jimmy with a history of osteoarthritis of knees, osteoarthritis of carpometacarpal joint of left thumb, CKD, CAD, hypertension who presents today to discuss hand pain.   His wife joins us  today.  His pain is located to the left first carpometacarpal joint of the left hand which began in late May/early June after moving from his home to an apartment. 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   Evaluated by PCP on 10/05/23 for same symptoms, was told that he had arthritis. Since evaluation by PCP he's noticed increased pain and swelling. His pain is more pronounced with use of the joint, lifting heavy objects. He's applied Voltaren Gel a few times and taken Ibuprofen 2-3 times weekly with temporary improvement. He denies erythema, warmth, significant decrease in ROM, radiation of pain, forearm pain.   Review of Systems  Musculoskeletal:  Positive for arthralgias and joint swelling.  Skin:  Negative for color change.  Neurological:  Negative for numbness.         Past Medical History:  Diagnosis Date   Allergy    Anxiety    Arthritis    knees and hip   CAD (coronary artery disease)    s/p emergent 4 V CABG (4/10) w 2/4 patent grafts by cath 08/11/09, patent LIMA to LAD & SVG to OM w moderate disease native RCA & occluded grafts to RCA and diagonal   Cataract    Chest pain    secondary to sternal non-union post CABg-now s/p sternotomy w repair 03/28/09 at [facility] dr. 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 Repeat at [facility] w reported ef of 40%.    Pneumonia 2016   in past    Restless leg syndrome    Schatzki's ring    Type 2 diabetes mellitus, controlled (HCC) 6/15   type 2    Social History   Socioeconomic History   Marital status: Married    Spouse name: Not on file   Number of children: 3   Years of education: Not on file   Highest education level: Some college, no degree  Occupational History   Occupation: Database administrator    Comment: Disabled  Tobacco Use   Smoking status: Former    Current packs/day: 0.00    Average packs/day: 0.5 packs/day for 37.0 years (18.5 ttl pk-yrs)    Types: Cigarettes    Start date: 07/19/1971    Quit date: 07/18/2008    Years since quitting: 15.3   Smokeless tobacco: Never   Tobacco comments:    No smoking since bypass in 4/10  Vaping Use   Vaping status: Never Used  Substance and Sexual Activity  Alcohol use: Yes    Alcohol/week: 0.0 standard drinks of alcohol    Comment: rarely   Drug use: No   Sexual activity: Not on file  Other Topics Concern   Not on file  Social History Narrative   Has living will   Wife is health care POA---alternate is daughter Manuelita   Would accept resuscitation   Not sure about tube feeds--but not prolonged   Social Drivers of Health   Financial Resource Strain: Low Risk  (09/30/2023)   Overall Financial Resource Strain (CARDIA)    Difficulty of Paying Living Expenses: Not very hard  Food Insecurity: No Food Insecurity (09/30/2023)   Hunger Vital Sign    Worried About Running Out of Food in the Last Year: Never true    Ran Out of Food in the Last Year: Never true  Transportation Needs: No Transportation Needs (09/30/2023)   PRAPARE - Scientist, research (physical sciences) (Medical): No    Lack of Transportation (Non-Medical): No  Physical Activity: Insufficiently Active (09/30/2023)   Exercise Vital Sign    Days of Exercise per Week: 2 days    Minutes of Exercise per Session: 20 min  Stress: Stress Concern Present (09/30/2023)   [employer] of Occupational Health - Occupational Stress Questionnaire    Feeling of Stress: To some extent  Social Connections: Moderately Isolated (09/30/2023)   Social Connection and Isolation Panel    Frequency of Communication with Friends and Family: Once a week    Frequency of Social Gatherings with Friends and Family: Once a week    Attends Religious Services: 1 to 4 times per year    Active Member of [employer] or Organizations: No    Attends Engineer, structural: Not on file    Marital Status: Married  Catering manager Violence: Not on file    Past Surgical History:  Procedure Laterality Date   CATARACT EXTRACTION W/PHACO Right 08/18/2016   Procedure: CATARACT EXTRACTION PHACO AND INTRAOCULAR LENS PLACEMENT (IOC) Right diabetic;  Surgeon: Dene Etienne, MD;  Location: [facility];  Service: Ophthalmology;  Laterality: Right;  Diabetic-oral meds poss Latex allergy   CATARACT EXTRACTION W/PHACO Left 09/15/2016   Procedure: CATARACT EXTRACTION PHACO AND INTRAOCULAR LENS PLACEMENT (IOC)  Left diabetic;  Surgeon: Etienne Dene, MD;  Location: [facility];  Service: Ophthalmology;  Laterality: Left;  Diabetic - oral meds   COLON SURGERY  2014   for perforated colon- diverticulitis   COLONOSCOPY  2003,2010   2010 colon normal- 2003 HPP x 1    CORONARY ANGIOPLASTY  2003   stentx2   CORONARY ARTERY BYPASS GRAFT  08/16/2008   x4   ESOPHAGEAL DILATION  ~2009   Dr Luis   ILEOSTOMY  2014   with reversal-- after diverticulitis. Sigmoid colectomy also. Dr Ely/Bird   INGUINAL HERNIA REPAIR Left x2   Dr Nettie   LEFT HEART CATH AND CORS/GRAFTS ANGIOGRAPHY N/A 09/27/2019    Procedure: LEFT HEART CATH AND CORS/GRAFTS ANGIOGRAPHY;  Surgeon: Verlin Lonni BIRCH, MD;  Location: [facility];  Service: Cardiovascular;  Laterality: N/A;   PILONIDAL CYST EXCISION     pilonidal sinus tract excision     POLYPECTOMY  2007   HPP x 1    STERNUM SEPARATION REPAIR  03/2009   [facility]   TONSILLECTOMY  1984   UPPER GASTROINTESTINAL ENDOSCOPY     VASECTOMY      Family History  Problem Relation Age of Onset   Heart failure  Mother    Diabetes Mother    Heart disease Mother    Congestive Heart Failure Mother        cause of death   Cancer Mother        ovarian?   Heart attack Father    Heart attack Sister    Diabetes Sister    Heart attack Brother    Diabetes Brother    Cancer Brother        Bladder   Heart attack Brother    Diabetes Brother    Bladder Cancer Brother    Cancer Maternal Aunt        died of breast cancer   Breast cancer Maternal Aunt    Cancer Maternal Grandmother        died of bone cancer   Bone cancer Maternal Grandmother    Colon cancer Neg Hx    Colon polyps Neg Hx    Esophageal cancer Neg Hx    Rectal cancer Neg Hx    Stomach cancer Neg Hx     Allergies  Allergen Reactions   Zolpidem Other (See Comments)    hallucinate   Cephalexin Hives   Keflin [Cephalothin]     rash   Latex Dermatitis    Bandages and adhesives/ poss to latex   Lisinopril Cough    REACTION: cough   Zolpidem Tartrate     UNKNOWN   Lamisil [Terbinafine] Rash    Rash/itchy    Current Outpatient Medications on File Prior to Visit  Medication Sig Dispense Refill   albuterol  (VENTOLIN  HFA) 108 (90 Base) MCG/ACT inhaler Inhale 2 puffs into the lungs every 4 (four) hours as needed for wheezing or shortness of breath. 18 g 0   ALPRAZolam  (XANAX ) 0.5 MG tablet TAKE ONE-HALF TABLET BY MOUTH AT BEDTIME 45 tablet 0   aspirin  81 MG EC tablet Take 81 mg by mouth daily.      atorvastatin  (LIPITOR) 20 MG tablet TAKE ONE TABLET BY MOUTH ONE TIME DAILY 90  tablet 3   Blood Glucose Monitoring Suppl DEVI One touch Verio monitor Dx code: E11.59 1 each 0   glucose blood (ONETOUCH VERIO) test strip Use to check blood sugar once a day. Dx Code E11.9 100 each 12   ibuprofen (ADVIL,MOTRIN) 200 MG tablet Take 400 mg by mouth 2 (two) times daily as needed (pain).      Lancet Devices (ONE TOUCH DELICA LANCING DEV) MISC Use to obtain blood sample for glucose Dx Code E11.9 1 each 0   losartan  (COZAAR ) 100 MG tablet Take 1 tablet (100 mg total) by mouth in the morning. 90 tablet 3   metFORMIN  (GLUCOPHAGE -XR) 750 MG 24 hr tablet TAKE 2 TABLET (750 MG TOTAL) BY MOUTH DAILY WITH BREAKFAST. 180 tablet 3   metoprolol  succinate (TOPROL -XL) 50 MG 24 hr tablet TAKE ONE TABLET BY MOUTH ONE TIME DAILY WITH OR IMMEDIATELY FOLLOWING A MEAL 90 tablet 1   nitroGLYCERIN  (NITROSTAT ) 0.4 MG SL tablet Place 1 tablet (0.4 mg total) under the tongue every 5 (five) minutes as needed for chest pain. 25 tablet 6   Omega-3 Fatty Acids (FISH OIL) 1000 MG CAPS Take 1,000 mg by mouth daily.     omeprazole  (PRILOSEC) 40 MG capsule TAKE ONE CAPSULE BY MOUTH ONE TIME DAILY 90 capsule 3   OneTouch Delica Lancets 33G MISC 1 each by Does not apply route daily. Use to obtain sample for glucose level Dx Code E11.9 100 each 3   sildenafil  (VIAGRA )  50 MG tablet Take 1 tablet (50 mg total) by mouth daily as needed for erectile dysfunction. 10 tablet 2   tiZANidine  (ZANAFLEX ) 2 MG tablet Take 1 tablet (2 mg total) by mouth 3 (three) times daily as needed for muscle spasms. 30 tablet 1   traZODone  (DESYREL ) 100 MG tablet TAKE 1 AND 1/2 TABLET BY MOUTH EVERY NIGHT AT BEDTIME AS NEEDED FOR SLEEP 135 tablet 3   [DISCONTINUED] pravastatin  (PRAVACHOL ) 40 MG tablet TAKE 1 TABLET BY MOUTH DAILY. 90 tablet 3   No current facility-administered medications on file prior to visit.    BP 136/78   Pulse 67   Temp (!) 96.8 F (36 C) (Temporal)   Ht 5' 9 (1.753 m)   Wt 160 lb (72.6 kg)   SpO2 98%   BMI 23.63  kg/m  Objective:   Physical Exam Pulmonary:     Effort: Pulmonary effort is normal.  Musculoskeletal:     Right hand: No bony tenderness. Normal range of motion.     Left hand: Swelling present. No deformity or bony tenderness. Normal range of motion. Normal strength. Normal pulse.       Hands:     Comments: Mild swelling to left first carpometacarpal joint  Skin:    General: Skin is warm and dry.     Findings: No erythema.  Neurological:     Mental Status: He is alert.           Assessment & Plan:  Primary osteoarthritis of first carpometacarpal joint of left hand Assessment & Plan: Exam and HPI today consistent. Unlikely gout.  No evidence of septic joint.  We discussed common causes for arthritis.  He case likely stems from decades of overuse but exacerbated from recent move in May/June 2025.  Start prednisone  tablets for inflammation. Take 3 tablets by mouth once daily in the morning for 3 days, then 2 tablets for 3 days, then 1 tablet for 3 days.  Continue Voltaren gel if needed.  Avoid ibuprofen at this time.  We discussed that Tylenol  was appropriate if needed, also to rest and avoid overuse if possible.  We also discussed that he may require evaluation by our sports medicine physician for injection. Follow up PRN.  Orders: -     DG Hand Complete Left -     predniSONE ; Take 3 tablets by mouth once daily in the morning for 3 days, then 2 tablets for 3 days, then 1 tablet for 3 days.  Dispense: 18 tablet; Refill: 0        Comer MARLA Gaskins, NP

## 2023-11-09 NOTE — Assessment & Plan Note (Signed)
 Exam and HPI today consistent. Unlikely gout.  No evidence of septic joint.  We discussed common causes for arthritis.  He case likely stems from decades of overuse but exacerbated from recent move in May/June 2025.  Start prednisone  tablets for inflammation. Take 3 tablets by mouth once daily in the morning for 3 days, then 2 tablets for 3 days, then 1 tablet for 3 days.  Continue Voltaren gel if needed.  Avoid ibuprofen at this time.  We discussed that Tylenol  was appropriate if needed, also to rest and avoid overuse if possible.  We also discussed that he may require evaluation by our sports medicine physician for injection. Follow up PRN.

## 2023-11-09 NOTE — Patient Instructions (Addendum)
 Complete xray(s) prior to leaving today. I will notify you of your results once received.  Start prednisone  medication for arthritis.  Take 3 tablets by mouth once daily in the morning for 3 days, then 2 tablets for 3 days, then 1 tablet for 3 days.  You may use the Voltaren gel as needed.  Avoid ibuprofen but you may take Tylenol  if needed.  Schedule a visit with Dr. Watt, our sports medicine physician, if symptoms do not improve/or if they return.  Avoid overuse of the joint if possible for now.  It was a pleasure meeting you!

## 2023-11-11 ENCOUNTER — Ambulatory Visit: Payer: Self-pay

## 2023-11-11 NOTE — Telephone Encounter (Signed)
 Pt states he took his first dose yesterday around 8:30 am and experienced insomnia last night and could not fall asleep and was awake until almost 4am, states his face turned red and was slightly puffy. States his BG  was 181 at 11pm and his BP was 99/62. States that his BP is normally around 130/85 at bedtime. States BG is normally around 120-140.  Retook BP this morning 112/79  P70.  Wife states that his face is still slightly red but not as puffy. States he took this mornings dose around 12-928 am. Denies and SOB or difficulty breathing. States he had noticed very slight improvement in his hand.     Please advise.

## 2023-11-11 NOTE — Telephone Encounter (Signed)
 Spoke to pt. He said he will stop taking it.

## 2023-11-11 NOTE — Telephone Encounter (Signed)
 FYI Only or Action Required?: Action required by provider: clinical question for provider, update on patient condition, and lab or test result follow-up needed.  Patient was last seen in primary care on 11/09/2023 by Gretta Comer POUR, NP.  Called Nurse Triage reporting Allergic Reaction.  Symptoms began yesterday.  Interventions attempted: Nothing.  Symptoms are: gradually improving.  Triage Disposition: Call PCP Now  Patient/caregiver understands and will follow disposition?: Yes      Copied from CRM #[id number]. Topic: Clinical - Red Word Triage >> Nov 11, 2023 10:38 AM Franky GRADE wrote: Red Word that prompted transfer to Nurse Triage: Patient experiencing side effects after taking predniSONE  (DELTASONE ) 20 MG tablet [[id number]], he was not able to sleep and his face turned red and puffy, blood pressure was low at 99/62 and sugar was high at 181. Reason for Disposition  [1] Caller has URGENT medicine question about med that primary care doctor (or NP/PA) or specialist prescribed AND [2] triager unable to answer question  Answer Assessment - Initial Assessment Questions 1. NAME of MEDICINE: What medicine(s) are you calling about?     Prednisone   2. QUESTION: What is your question? (e.g., double dose of medicine, side effect)     Side effects 3. PRESCRIBER: Who prescribed the medicine? Reason: if prescribed by specialist, call should be referred to that group.     Comer Gretta NP 4. SYMPTOMS: Do you have any symptoms? If Yes, ask: What symptoms are you having?  How bad are the symptoms (e.g., mild, moderate, severe)     Mod Pt states he took his first dose yesterday and experienced insomnia last night and could not fall asleep and was awake until almost 4am, states his face turned red and was slightly puffy. States his BG  was 181 at 11pm and his BP was 99/62.  Protocols used: Medication Question Call-A-AH

## 2023-11-14 ENCOUNTER — Telehealth: Payer: Self-pay | Admitting: Internal Medicine

## 2023-11-14 NOTE — Telephone Encounter (Signed)
 Copied from CRM [id number]. Topic: General - Call Back - No Documentation >> Nov 11, 2023  4:21 PM Rea C wrote: Reason for CRM: Patient called in with wife.  They are both set up for [facility] with NP Vincente in January.   Patients are existing patients of Dr. Letvak and they said that usually labs are done in December. But, both patients are wondering if labs will be done prior to their appointment or on same day because they noted on the appointment notes that fasting is not required.  Patient would also like to know if NP Vincente will be ordering the same labs that Dr. Jimmy had ordered for him. Patient would appreciate a call back at [phone] (H).

## 2023-11-15 NOTE — Telephone Encounter (Signed)
 Noted

## 2023-11-15 NOTE — Telephone Encounter (Signed)
 Patient and wife are scheduled for fasting labs on 05/16/23 at 9:00 & 9:15.

## 2023-11-15 NOTE — Telephone Encounter (Signed)
 I would like to have fasting labs completed prior to to the appointment.  Please schedule both patient for fasting lab 2 days prior to the appointment.

## 2023-11-16 ENCOUNTER — Ambulatory Visit: Payer: Self-pay | Admitting: Primary Care

## 2024-01-03 ENCOUNTER — Telehealth: Payer: Self-pay

## 2024-01-03 NOTE — Telephone Encounter (Signed)
 Copied from CRM [id number]. Topic: Appointments - Transfer of Care >> Jan 03, 2024 11:17 AM Donee H wrote: Pt is requesting to transfer FROM: Dr. Charlie Denise Pt is requesting to transfer TO: Lauraine Buoy Reason for requested transfer: Previous pcp retired  It is the responsibility of the team the patient would like to transfer to (Dr. Buoy) to reach out to the patient if for any reason this transfer is not acceptable.

## 2024-01-05 ENCOUNTER — Other Ambulatory Visit: Payer: Self-pay | Admitting: Internal Medicine

## 2024-01-05 ENCOUNTER — Telehealth: Payer: Self-pay

## 2024-01-05 DIAGNOSIS — Z23 Encounter for immunization: Secondary | ICD-10-CM | POA: Diagnosis not present

## 2024-01-05 NOTE — Telephone Encounter (Unsigned)
 Copied from CRM [id number]. Topic: Clinical - Medication Refill >> Jan 05, 2024 12:01 PM Drema MATSU wrote: Medication: ALPRAZolam  (XANAX ) 0.5 MG tablet  Has the patient contacted their pharmacy? No (Agent: If no, request that the patient contact the pharmacy for the refill. If patient does not wish to contact the pharmacy document the reason why and proceed with request.) call provider  (Agent: If yes, when and what did the pharmacy advise?)  This is the patient's preferred pharmacy:  [facility] [address] [facility] - [place], [place] - [address] [place] [address] Phone: [phone] Fax: [phone]  Is this the correct pharmacy for this prescription? Yes If no, delete pharmacy and type the correct one.   Has the prescription been filled recently? No 3 month supply   Is the patient out of the medication? No  Has the patient been seen for an appointment in the last year OR does the patient have an upcoming appointment? Yes  Can we respond through MyChart? Yes  Agent: Please be advised that Rx refills may take up to 3 business days. We ask that you follow-up with your pharmacy.

## 2024-01-05 NOTE — Telephone Encounter (Signed)
 Copied from CRM [id number]. Topic: General - Other >> Jan 05, 2024 12:05 PM Drema MATSU wrote: Reason for CRM: Patient wants to let office know that he had his covid vaccination today.

## 2024-01-06 MED ORDER — ALPRAZOLAM 0.5 MG PO TABS: 0.2500 mg | ORAL_TABLET | Freq: Every day | ORAL | 0 refills | Status: DC

## 2024-01-06 NOTE — Telephone Encounter (Signed)
 Chart has been updated.

## 2024-01-11 ENCOUNTER — Other Ambulatory Visit: Payer: Self-pay

## 2024-01-11 MED ORDER — LOSARTAN POTASSIUM 100 MG PO TABS: 100.0000 mg | ORAL_TABLET | Freq: Every morning | ORAL | 0 refills | Status: DC

## 2024-01-11 NOTE — Telephone Encounter (Signed)
 Rx sent electronically.

## 2024-01-24 ENCOUNTER — Other Ambulatory Visit: Payer: Self-pay

## 2024-01-24 MED ORDER — ONETOUCH VERIO VI STRP: ORAL_STRIP | 0 refills | Status: DC

## 2024-01-30 ENCOUNTER — Ambulatory Visit (INDEPENDENT_AMBULATORY_CARE_PROVIDER_SITE_OTHER): Admitting: Family Medicine

## 2024-01-30 ENCOUNTER — Encounter: Payer: Self-pay | Admitting: Family Medicine

## 2024-01-30 VITALS — BP 120/70 | HR 71 | Temp 98.0°F | Ht 69.0 in | Wt 159.9 lb

## 2024-01-30 DIAGNOSIS — E1169 Type 2 diabetes mellitus with other specified complication: Secondary | ICD-10-CM | POA: Diagnosis not present

## 2024-01-30 DIAGNOSIS — F17211 Nicotine dependence, cigarettes, in remission: Secondary | ICD-10-CM | POA: Diagnosis not present

## 2024-01-30 DIAGNOSIS — M79609 Pain in unspecified limb: Secondary | ICD-10-CM

## 2024-01-30 DIAGNOSIS — R519 Headache, unspecified: Secondary | ICD-10-CM | POA: Insufficient documentation

## 2024-01-30 DIAGNOSIS — M545 Low back pain, unspecified: Secondary | ICD-10-CM

## 2024-01-30 DIAGNOSIS — E1122 Type 2 diabetes mellitus with diabetic chronic kidney disease: Secondary | ICD-10-CM

## 2024-01-30 DIAGNOSIS — Z79899 Other long term (current) drug therapy: Secondary | ICD-10-CM

## 2024-01-30 DIAGNOSIS — I252 Old myocardial infarction: Secondary | ICD-10-CM

## 2024-01-30 DIAGNOSIS — I2510 Atherosclerotic heart disease of native coronary artery without angina pectoris: Secondary | ICD-10-CM

## 2024-01-30 DIAGNOSIS — E782 Mixed hyperlipidemia: Secondary | ICD-10-CM | POA: Diagnosis not present

## 2024-01-30 DIAGNOSIS — N1831 Chronic kidney disease, stage 3a: Secondary | ICD-10-CM | POA: Diagnosis not present

## 2024-01-30 DIAGNOSIS — Z7984 Long term (current) use of oral hypoglycemic drugs: Secondary | ICD-10-CM

## 2024-01-30 DIAGNOSIS — I10 Essential (primary) hypertension: Secondary | ICD-10-CM | POA: Diagnosis not present

## 2024-01-30 DIAGNOSIS — F5104 Psychophysiologic insomnia: Secondary | ICD-10-CM | POA: Insufficient documentation

## 2024-01-30 DIAGNOSIS — K219 Gastro-esophageal reflux disease without esophagitis: Secondary | ICD-10-CM

## 2024-01-30 DIAGNOSIS — Z7689 Persons encountering health services in other specified circumstances: Secondary | ICD-10-CM

## 2024-01-30 DIAGNOSIS — M1812 Unilateral primary osteoarthritis of first carpometacarpal joint, left hand: Secondary | ICD-10-CM | POA: Diagnosis not present

## 2024-01-30 DIAGNOSIS — I868 Varicose veins of other specified sites: Secondary | ICD-10-CM

## 2024-01-30 MED ORDER — ONETOUCH DELICA PLUS LANCET33G MISC: 1.0000 | Freq: Every day | 3 refills | Status: DC

## 2024-01-30 MED ORDER — METFORMIN HCL ER 750 MG PO TB24: ORAL_TABLET | ORAL | 2 refills | Status: AC

## 2024-01-30 MED ORDER — ONETOUCH VERIO VI STRP: ORAL_STRIP | 0 refills | Status: AC

## 2024-01-30 MED ORDER — METOPROLOL SUCCINATE ER 50 MG PO TB24: 50.0000 mg | ORAL_TABLET | Freq: Every day | ORAL | 3 refills | Status: AC

## 2024-01-30 MED ORDER — TIZANIDINE HCL 2 MG PO TABS
2.0000 mg | ORAL_TABLET | Freq: Three times a day (TID) | ORAL | 1 refills | Status: DC | PRN
Start: 2024-01-30 — End: 2024-03-03

## 2024-01-30 MED ORDER — ALPRAZOLAM 0.5 MG PO TABS: 0.2500 mg | ORAL_TABLET | Freq: Every day | ORAL | 0 refills | Status: AC

## 2024-01-30 MED ORDER — OMEPRAZOLE 40 MG PO CPDR: 40.0000 mg | DELAYED_RELEASE_CAPSULE | Freq: Every day | ORAL | 3 refills | Status: AC

## 2024-01-30 MED ORDER — LOSARTAN POTASSIUM 100 MG PO TABS: 100.0000 mg | ORAL_TABLET | Freq: Every morning | ORAL | 3 refills | Status: AC

## 2024-01-30 MED ORDER — ATORVASTATIN CALCIUM 20 MG PO TABS: 20.0000 mg | ORAL_TABLET | Freq: Every day | ORAL | 3 refills | Status: AC

## 2024-01-30 MED ORDER — TRAZODONE HCL 100 MG PO TABS: ORAL_TABLET | ORAL | 3 refills | Status: AC

## 2024-01-30 NOTE — Patient Instructions (Addendum)
   Recommend taking a nightly allergy medication: one tablet every night of any of the following:  (Least effective to most) Loratadine -> cetirizine or fexofenadine -> levocetirizine  Recommended vaccines: shingrix (shingles) and flu vaccine.

## 2024-01-30 NOTE — Assessment & Plan Note (Signed)
 In remission since CABG in 2010.  No acute concerns.  Continue to monitor.

## 2024-01-30 NOTE — Assessment & Plan Note (Addendum)
 Chronic, stable.  Well-controlled today on metoprolol  succinate 50 mg daily and losartan  100 mg daily.  No changes today.

## 2024-01-30 NOTE — Assessment & Plan Note (Signed)
 Intermittent painful varicosity with potential for small blood clots, no intervention recommended previously. - Monitor symptoms and consider ultrasound of the arm if pain worsens. - Consider increasing aspirin  dose if symptoms worsen and/or increase in frequency. - Discuss with cardiologist if symptoms persist.

## 2024-01-30 NOTE — Progress Notes (Signed)
 New patient visit   Patient: Joseph [name]   DOB: 1955-06-05   68 y.o. Male  MRN: [id number] Visit Date: 01/30/2024  Today's healthcare provider: LAURAINE LOISE BUOY, DO   Chief Complaint  Patient presents with   Transitions Of Care    Patient is here to transfer care with provider.  Diabetic Eye Exam on this Friday.  Vaccine denied.   Subjective    Joseph [name] is a 68 y.o. male who presents today as a new patient to establish care.   HPI HPI     Transitions Of Care    Additional comments: Patient is here to transfer care with provider.  Diabetic Eye Exam on this Friday.  Vaccine denied.      Last edited by Terrel Powell CROME, CMA on 01/30/2024  9:25 AM.      Last Medicare annual wellness exam: 04/05/2023  Joseph [name] is a 68 year old male who presents with sinus headaches and concerns about varicosities.  He has experienced sinus headaches for years, typically starting behind his left eye and radiating upwards into his head. These headaches are often weather-related. He does not take any allergy medications.  He mentions a painful varicosity in his arm, which sometimes becomes more swollen and painful. Swelling is localized to the site of the varicosity. His cardiologist and previous general practitioner have not expressed concern about this issue.  He describes numbness in his left shoulder, which began in the last month or two. The numbness is not associated with soreness but presents as tingling. He recalls doing a lot of lifting during a household move at the end of May, but he does not believe this is related.  He has a history of elevated blood sugar and blood pressure over the last year. He monitors his blood sugar daily, noting it averages between 130 to 160 mg/dL. His blood pressure typically averages around 140/90 mmHg, with occasional diastolic readings over 100 mmHg. He has a history of coronary artery bypass grafting (CABG) in 2010 and a previous  heart attack in 2003, with stents placed subsequently.  He has a history of insomnia and takes trazodone  and alprazolam  at bedtime. He also takes metformin  twice daily, losartan , atorvastatin  in the morning, and a baby aspirin . He occasionally uses ibuprofen and Tylenol  for arthritis pain in his thumb, which began suddenly in June and was diagnosed via x-ray.  He reports a history of chronic kidney disease since 2016, with stable levels. He occasionally experiences low blood sugar, around 65-75 mg/dL, usually at bedtime, which he manages with a piece of candy.   Dr. Dene [name] at [facility] for eye exam - has Friday appointment     Past Medical History:  Diagnosis Date   Allergy    Anxiety    Arthritis    knees and hip   CAD (coronary artery disease)    s/p emergent 4 V CABG (4/10) w 2/4 patent grafts by cath 08/11/09, patent LIMA to LAD & SVG to OM w moderate disease native RCA & occluded grafts to RCA and diagonal   Cataract    Chest pain    secondary to sternal non-union post CABg-now s/p sternotomy w repair 03/28/09 at [facility], dr. 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 Repeat at [facility] w reported ef of 40%.    Pneumonia 2016   in past    Restless leg syndrome    Schatzki's ring    Type 2 diabetes mellitus, controlled (HCC) 6/15   type 2   Past Surgical History:  Procedure Laterality Date   CATARACT EXTRACTION W/PHACO Right 08/18/2016   Procedure: CATARACT EXTRACTION PHACO AND INTRAOCULAR LENS  PLACEMENT (IOC) Right diabetic;  Surgeon: Joseph Etienne, MD;  Location: [facility];  Service: Ophthalmology;  Laterality: Right;  Diabetic-oral meds poss Latex allergy   CATARACT EXTRACTION W/PHACO Left 09/15/2016   Procedure: CATARACT EXTRACTION PHACO AND INTRAOCULAR LENS PLACEMENT (IOC)  Left diabetic;  Surgeon: [name] Dene, MD;  Location: [facility];  Service: Ophthalmology;  Laterality: Left;  Diabetic - oral meds   COLON SURGERY  2014   for perforated colon- diverticulitis   COLONOSCOPY  2003,2010   2010 colon normal- 2003 HPP x 1    CORONARY ANGIOPLASTY  2003   stentx2   CORONARY ARTERY BYPASS GRAFT  08/16/2008   x4   ESOPHAGEAL DILATION  ~2009   Dr Luis   EYE SURGERY  Cataracts  2018?   ILEOSTOMY  2014   with reversal-- after diverticulitis. Sigmoid colectomy also. Dr Ely/Bird   INGUINAL HERNIA REPAIR Left x2   Dr Nettie   LEFT HEART CATH AND CORS/GRAFTS ANGIOGRAPHY N/A 09/27/2019   Procedure: LEFT HEART CATH AND CORS/GRAFTS ANGIOGRAPHY;  Surgeon: Verlin Lonni BIRCH, MD;  Location: [facility];  Service: Cardiovascular;  Laterality: N/A;   PILONIDAL CYST EXCISION     pilonidal sinus tract excision     POLYPECTOMY  2007   HPP x 1    STERNUM SEPARATION REPAIR  03/2009   [facility]   TONSILLECTOMY  1984   UPPER GASTROINTESTINAL ENDOSCOPY     VASECTOMY     Family Status  Relation Name Status   Mother Mom Deceased       CHF   Father Dad Deceased at age 42   Sister 5 Alive   Brother Fish farm manager 2 Deceased   Brother 5 Alive   Mat Aunt Probation officer (Not Specified)   MGM Mama G (Not Specified)   MGF Graddaddy G Alive   PGF Granddaddy H Alive   Mat Aunt Aunt E Alive   Brother Bro A Alive   Brother Bro JR Alive   Mat Aunt Aunt L Alive   Pat Uncle Uncle S Alive   Mat Uncle Uncle CD Alive   Neg Hx  (Not Specified)  No partnership data on file   Family History  Problem Relation Age of Onset   Heart failure Mother    Diabetes Mother     Heart disease Mother    Congestive Heart Failure Mother        cause of death   Cancer Mother        ovarian?   Heart attack Father    Heart disease Father    Heart attack Sister    Diabetes Sister    Heart attack Brother    Diabetes Brother    Cancer Brother        Bladder  Heart attack Brother    Diabetes Brother    Bladder Cancer Brother    Cancer Maternal Aunt        died of breast cancer   Breast cancer Maternal Aunt    Cancer Maternal Grandmother        died of bone cancer   Bone cancer Maternal Grandmother    Heart disease Maternal Grandfather    Heart disease Paternal Grandfather    Heart disease Brother    Heart disease Brother    Heart disease Maternal Aunt    Heart disease Paternal Uncle    Heart disease Maternal Uncle    Colon cancer Neg Hx    Colon polyps Neg Hx    Esophageal cancer Neg Hx    Rectal cancer Neg Hx    Stomach cancer Neg Hx    Social History   Socioeconomic History   Marital status: Married    Spouse name: Not on file   Number of children: 3   Years of education: Not on file   Highest education level: Some college, no degree  Occupational History   Occupation: Database administrator    Comment: Disabled  Tobacco Use   Smoking status: Former    Current packs/day: 0.00    Average packs/day: 0.5 packs/day for 37.0 years (18.5 ttl pk-yrs)    Types: Cigarettes    Start date: 07/19/1971    Quit date: 07/18/2008    Years since quitting: 15.5   Smokeless tobacco: Never   Tobacco comments:    No smoking since bypass in 4/10  Vaping Use   Vaping status: Never Used  Substance and Sexual Activity   Alcohol use: Yes    Alcohol/week: 0.0 standard drinks of alcohol    Comment: rarely   Drug use: No   Sexual activity: Not on file  Other Topics Concern   Not on file  Social History Narrative   Has living will   Wife is health care POA---alternate is daughter Manuelita   Would accept resuscitation   Not sure about tube feeds--but not  prolonged   Social Drivers of Health   Financial Resource Strain: Low Risk  (01/29/2024)   Overall Financial Resource Strain (CARDIA)    Difficulty of Paying Living Expenses: Not hard at all  Food Insecurity: No Food Insecurity (01/29/2024)   Hunger Vital Sign    Worried About Running Out of Food in the Last Year: Never true    Ran Out of Food in the Last Year: Never true  Transportation Needs: No Transportation Needs (01/29/2024)   PRAPARE - Administrator, Civil Service (Medical): No    Lack of Transportation (Non-Medical): No  Physical Activity: Insufficiently Active (01/29/2024)   Exercise Vital Sign    Days of Exercise per Week: 2 days    Minutes of Exercise per Session: 20 min  Stress: No Stress Concern Present (01/29/2024)   [employer] of Occupational Health - Occupational Stress Questionnaire    Feeling of Stress: Only a little  Social Connections: Moderately Isolated (01/29/2024)   Social Connection and Isolation Panel    Frequency of Communication with Friends and Family: Once a week    Frequency of Social Gatherings with Friends and Family: Once a week    Attends Religious Services: 1 to 4 times per year    Active Member of [employer] or Organizations: No    Attends Banker Meetings: Not on file    Marital Status: Married   Outpatient Medications  Prior to Visit  Medication Sig Note   aspirin  81 MG EC tablet Take 81 mg by mouth daily.     Blood Glucose Monitoring Suppl DEVI One touch Verio monitor Dx code: E11.59    ibuprofen (ADVIL,MOTRIN) 200 MG tablet Take 400 mg by mouth 2 (two) times daily as needed (pain).     nitroGLYCERIN  (NITROSTAT ) 0.4 MG SL tablet Place 1 tablet (0.4 mg total) under the tongue every 5 (five) minutes as needed for chest pain.    Omega-3 Fatty Acids (FISH OIL) 1000 MG CAPS Take 1,000 mg by mouth daily.    OneTouch Delica Lancets 33G MISC 1 each by Does not apply route daily. Use to obtain sample for glucose level Dx  Code E11.9    sildenafil  (VIAGRA ) 50 MG tablet Take 1 tablet (50 mg total) by mouth daily as needed for erectile dysfunction.    [DISCONTINUED] ALPRAZolam  (XANAX ) 0.5 MG tablet Take 0.5 tablets (0.25 mg total) by mouth at bedtime.    [DISCONTINUED] atorvastatin  (LIPITOR) 20 MG tablet TAKE ONE TABLET BY MOUTH ONE TIME DAILY    [DISCONTINUED] glucose blood (ONETOUCH VERIO) test strip Use to check blood sugar once a day. Dx Code E11.9    [DISCONTINUED] Lancet Devices (ONE TOUCH DELICA LANCING DEV) MISC Use to obtain blood sample for glucose Dx Code E11.9    [DISCONTINUED] losartan  (COZAAR ) 100 MG tablet Take 1 tablet (100 mg total) by mouth in the morning.    [DISCONTINUED] metFORMIN  (GLUCOPHAGE -XR) 750 MG 24 hr tablet TAKE 2 TABLET (750 MG TOTAL) BY MOUTH DAILY WITH BREAKFAST.    [DISCONTINUED] metoprolol  succinate (TOPROL -XL) 50 MG 24 hr tablet TAKE ONE TABLET BY MOUTH ONE TIME DAILY WITH OR IMMEDIATELY FOLLOWING A MEAL    [DISCONTINUED] omeprazole  (PRILOSEC) 40 MG capsule TAKE ONE CAPSULE BY MOUTH ONE TIME DAILY    [DISCONTINUED] tiZANidine  (ZANAFLEX ) 2 MG tablet Take 1 tablet (2 mg total) by mouth 3 (three) times daily as needed for muscle spasms.    [DISCONTINUED] traZODone  (DESYREL ) 100 MG tablet TAKE 1 AND 1/2 TABLET BY MOUTH EVERY NIGHT AT BEDTIME AS NEEDED FOR SLEEP    [DISCONTINUED] albuterol  (VENTOLIN  HFA) 108 (90 Base) MCG/ACT inhaler Inhale 2 puffs into the lungs every 4 (four) hours as needed for wheezing or shortness of breath. 01/30/2024: Does not use it   [DISCONTINUED] predniSONE  (DELTASONE ) 20 MG tablet Take 3 tablets by mouth once daily in the morning for 3 days, then 2 tablets for 3 days, then 1 tablet for 3 days.    No facility-administered medications prior to visit.   Allergies  Allergen Reactions   Zolpidem Other (See Comments)    hallucinate   Cephalexin Hives   Keflin [Cephalothin]     rash   Latex Dermatitis    Bandages and adhesives/ poss to latex   Lisinopril  Cough    REACTION: cough   Zolpidem Tartrate     UNKNOWN   Lamisil [Terbinafine] Rash    Rash/itchy   Prednisone  Rash    Fever, red flushed face, and swelling on the face    Immunization History  Administered Date(s) Administered   Fluad Quad(high Dose 65+) 02/13/2019, 01/16/2020, 01/19/2022   Fluad Trivalent(High Dose 65+) 01/05/2023   Influenza Inj Mdck Quad Pf 02/03/2017   Influenza, Seasonal, Injecte, Preservative Fre 01/24/2015, 01/15/2016   Influenza-Unspecified 01/17/2014, 02/09/2018, 02/04/2021   Moderna Covid-19 Vaccine  Bivalent Booster 67yrs & up 01/25/2021, 02/17/2022   Moderna SARS-COV2 Booster Vaccination 02/08/2020, 08/21/2020   Moderna Sars-Covid-2 Vaccination  06/01/2019, 06/29/2019, 04/17/2023   Pfizer(Comirnaty)Fall Seasonal Vaccine 12 years and older 01/05/2024   Pneumococcal Conjugate-13 07/24/2014   Pneumococcal Polysaccharide-23 03/16/2016   Td 04/19/2005   Tdap 07/16/2010, 03/16/2016   Zoster Recombinant(Shingrix) 05/04/2022   Zoster, Live 08/29/2014    Health Maintenance  Topic Date Due   Diabetic kidney evaluation - Urine ACR  Never done   Zoster Vaccines- Shingrix (2 of 2) 06/29/2022   Influenza Vaccine  11/18/2023   OPHTHALMOLOGY EXAM  01/28/2024   Pneumococcal Vaccine: 50+ Years (3 of 3 - PCV20 or PCV21) 04/05/2024 (Originally 03/16/2021)   Diabetic kidney evaluation - eGFR measurement  04/05/2024   FOOT EXAM  04/05/2024   HEMOGLOBIN A1C  04/05/2024   Medicare Annual Wellness (AWV)  04/05/2024   COVID-19 Vaccine (7 - Moderna risk 2025-26 season) 07/04/2024   Colonoscopy  12/26/2024   DTaP/Tdap/Td (4 - Td or Tdap) 03/16/2026   Hepatitis C Screening  Completed   Meningococcal B Vaccine  Aged Out    Patient Care Team: Dieter Hane, Lauraine SAILOR, DO as PCP - General (Family Medicine) Verlin Lonni BIRCH, MD as PCP - Cardiology (Cardiology)  Review of Systems  Constitutional:  Negative for chills, fatigue and fever.  HENT:  Positive for rhinorrhea  and sinus pressure (See HPI). Negative for congestion, ear pain and postnasal drip.   Respiratory: Negative.  Negative for cough, shortness of breath and wheezing.   Cardiovascular:  Negative for chest pain, palpitations and leg swelling.  Gastrointestinal:  Positive for constipation (Intermittent) and diarrhea (Loose stools once every week or two.). Negative for abdominal pain, nausea and vomiting.  Musculoskeletal:  Positive for arthralgias (Chronic, intermittent, in both shoulders), back pain (Chronic, intermittent.) and neck pain (Chronic, intermittent).  Neurological:  Positive for numbness (See HPI) and headaches (See HPI). Negative for dizziness, weakness and light-headedness.        Objective    BP 120/70 (BP Location: Left Arm, Patient Position: Sitting)   Pulse 71   Temp 98 F (36.7 C) (Oral)   Ht 5' 9 (1.753 m)   Wt 159 lb 14.4 oz (72.5 kg)   SpO2 99%   BMI 23.61 kg/m     Physical Exam Vitals and nursing note reviewed.  Constitutional:      General: He is awake.     Appearance: Normal appearance.  HENT:     Head: Normocephalic and atraumatic.     Right Ear: Tympanic membrane, ear canal and external ear normal.     Left Ear: Tympanic membrane, ear canal and external ear normal.     Nose: Nose normal.     Mouth/Throat:     Mouth: Mucous membranes are moist.     Pharynx: Oropharynx is clear. No oropharyngeal exudate or posterior oropharyngeal erythema.  Eyes:     General: No scleral icterus.    Extraocular Movements: Extraocular movements intact.     Conjunctiva/sclera: Conjunctivae normal.     Pupils: Pupils are equal, round, and reactive to light.  Neck:     Thyroid: No thyromegaly or thyroid tenderness.  Cardiovascular:     Rate and Rhythm: Normal rate and regular rhythm.     Pulses: Normal pulses.     Heart sounds: Normal heart sounds.  Pulmonary:     Effort: Pulmonary effort is normal. No tachypnea, bradypnea or respiratory distress.     Breath sounds:  Normal breath sounds. No stridor. No wheezing, rhonchi or rales.  Abdominal:     General: Bowel sounds are normal. There is  no distension.     Palpations: Abdomen is soft. There is no mass.     Tenderness: There is no abdominal tenderness. There is no guarding.     Hernia: No hernia is present.  Musculoskeletal:     Cervical back: Normal range of motion and neck supple.     Right lower leg: No edema.     Left lower leg: No edema.  Lymphadenopathy:     Cervical: No cervical adenopathy.  Skin:    General: Skin is warm and dry.         Comments: Mildly enlarged varicosity on patient's upper left arm, approximately 13 mm in diameter.  Neurological:     Mental Status: He is alert and oriented to person, place, and time. Mental status is at baseline.  Psychiatric:        Mood and Affect: Mood normal.        Behavior: Behavior normal.     Depression Screen    01/30/2024   10:16 AM 10/05/2023    9:36 AM 04/06/2023    9:56 AM 04/06/2023    9:28 AM  PHQ 2/9 Scores  PHQ - 2 Score 0 0 0 0  PHQ- 9 Score 2      No results found for any visits on 01/30/24.  Assessment & Plan     Essential hypertension Assessment & Plan: Chronic, stable.  Well-controlled today on metoprolol  succinate 50 mg daily and losartan  100 mg daily.  No changes today.  Orders: -     Metoprolol  Succinate ER; Take 1 tablet (50 mg total) by mouth daily. Take with or immediately following a meal.  Dispense: 90 tablet; Refill: 3 -     Losartan  Potassium; Take 1 tablet (100 mg total) by mouth in the morning.  Dispense: 90 tablet; Refill: 3  Establishing care with new doctor, encounter for  Mixed hyperlipidemia due to type 2 diabetes mellitus (HCC) Assessment & Plan: Chronic, stable.  Well-controlled on atorvastatin  20 mg nightly.  No changes today.  Orders: -     Lipid panel -     Atorvastatin  Calcium ; Take 1 tablet (20 mg total) by mouth daily.  Dispense: 90 tablet; Refill: 3  Type 2 diabetes mellitus with  stage 3a chronic kidney disease, without long-term current use of insulin (HCC) Assessment & Plan: Chronic, stable/well-controlled.  Continue metformin  XR 750 mg twice daily.  No changes today. - Consider switching to Jardiance or Farxiga for kidney function, noting potential initial dip in kidney function before stabilization.  Deferred for the time being. - Check A1c every six months.   Orders: -     Microalbumin / creatinine urine ratio -     Comprehensive metabolic panel with GFR -     Hemoglobin A1c -     metFORMIN  HCl ER; TAKE 2 TABLET (750 MG TOTAL) BY MOUTH DAILY WITH BREAKFAST.  Dispense: 180 tablet; Refill: 2 -     OneTouch Verio; Use to check blood sugar once a day. Dx Code E11.9  Dispense: 100 each; Refill: 0 -     OneTouch Delica Plus Lancet33G; 1 each by Does not apply route daily.  Dispense: 100 each; Refill: 3  Gastroesophageal reflux disease without esophagitis Assessment & Plan: Chronic, stable.  Well-controlled on omeprazole  40 mg daily.  No changes today.  Will check vitamin B12 level today due to chronic use of PPI.  Orders: -     Vitamin B12 -     Omeprazole ; Take 1 capsule (40  mg total) by mouth daily.  Dispense: 90 capsule; Refill: 3  High risk medication use -     Vitamin B12  Cigarette nicotine dependence in remission Assessment & Plan: In remission since CABG in 2010.  No acute concerns.  Continue to monitor.    Chronic midline low back pain without sciatica Assessment & Plan: Uses ibuprofen rarely Also uses cyclobenzaprine  rarely for back, as well as neck and shoulders.  Orders: -     tiZANidine  HCl; Take 1 tablet (2 mg total) by mouth 3 (three) times daily as needed for muscle spasms.  Dispense: 30 tablet; Refill: 1  Osteoarthritis of carpometacarpal (CMC) joint of left thumb, unspecified osteoarthritis type Assessment & Plan: Patient reports debilitating pain in left thumb, previous prednisone  stopped due to allergy.  Patient unable to grasp  things with left thumb.  Discussed potential for referral to sports medicine or orthopedics; patient will be contacting his orthopedic doctor. - Continue ibuprofen and Tylenol  as needed. - Continue Voltaren gel as needed. - Discussed possibility of doing an alternative steroid pack since patient was unable to tolerate the original prednisone .  Deferred per patient preference for time being. - Consider chronic daily NSAID like meloxicam if needed.    Coronary artery disease with history of myocardial infarction without history of CABG Assessment & Plan: Stable post-CABG and stenting with normal ejection fraction and grade 1 diastolic dysfunction on echo in 2021. - Continue current cardiac medications including atorvastatin , metoprolol , losartan  and aspirin . - Ordered lipid panel today. - Follow up with cardiology as scheduled.  Defer to specialist management  Orders: -     Metoprolol  Succinate ER; Take 1 tablet (50 mg total) by mouth daily. Take with or immediately following a meal.  Dispense: 90 tablet; Refill: 3  Varicose veins of left upper extremity Assessment & Plan: Intermittent painful varicosity with potential for small blood clots, no intervention recommended previously. - Monitor symptoms and consider ultrasound of the arm if pain worsens. - Consider increasing aspirin  dose if symptoms worsen and/or increase in frequency. - Discuss with cardiologist if symptoms persist.    Psychophysiological insomnia Assessment & Plan: Chronic insomnia. Long history managed with trazodone  and alprazolam .  Did not tolerate Ambien or another unknown medication previously. - Continue trazodone  and alprazolam  at bedtime.  Order refills.  Orders: -     traZODone  HCl; TAKE 1 AND 1/2 TABLET BY MOUTH EVERY NIGHT AT BEDTIME AS NEEDED FOR SLEEP  Dispense: 135 tablet; Refill: 3 -     ALPRAZolam ; Take 0.5 tablets (0.25 mg total) by mouth at bedtime.  Dispense: 45 tablet; Refill: 0  Sinus  headache Assessment & Plan: Weather-related sinus headaches, no current allergy medication use. - Consider daily allergy medication such as loratadine, cetirizine, or fexofenadine during symptomatic periods.    Paresthesia and pain of left extremity  Assessment and plan: New onset of tingling in left shoulder, inconsistent with patient's previous numbness during his heart attack.  Suspect neck-related etiology.  Discussed potential to check ECG; deferred for the time being through shared decision making. - Monitor symptoms and note timing and triggers. - Consider neck x-ray if symptoms persist or worsen.    Return in about 2 months (around 04/05/2024) for mAWV with AWV nurse, and in 6 months for chronic f/u.     I discussed the assessment and treatment plan with the patient  The patient was provided an opportunity to ask questions and all were answered. The patient agreed with the plan and demonstrated an  understanding of the instructions.   The patient was advised to call back or seek an in-person evaluation if the symptoms worsen or if the condition fails to improve as anticipated.    LAURAINE LOISE BUOY, DO  [facility] [phone] (phone) [phone] (fax)  [facility]

## 2024-01-30 NOTE — Assessment & Plan Note (Signed)
 Chronic insomnia. Long history managed with trazodone  and alprazolam .  Did not tolerate Ambien or another unknown medication previously. - Continue trazodone  and alprazolam  at bedtime.  Order refills.

## 2024-01-30 NOTE — Assessment & Plan Note (Signed)
 Weather-related sinus headaches, no current allergy medication use. - Consider daily allergy medication such as loratadine, cetirizine, or fexofenadine during symptomatic periods.

## 2024-01-30 NOTE — Assessment & Plan Note (Signed)
 Chronic, stable.  Well-controlled on atorvastatin  20 mg nightly.  No changes today.

## 2024-01-30 NOTE — Assessment & Plan Note (Signed)
 Uses ibuprofen rarely Also uses cyclobenzaprine  rarely for back, as well as neck and shoulders.

## 2024-01-30 NOTE — Assessment & Plan Note (Signed)
 Stable post-CABG and stenting with normal ejection fraction and grade 1 diastolic dysfunction on echo in 2021. - Continue current cardiac medications including atorvastatin , metoprolol , losartan  and aspirin . - Ordered lipid panel today. - Follow up with cardiology as scheduled.  Defer to specialist management

## 2024-01-30 NOTE — Assessment & Plan Note (Addendum)
 Chronic, stable.  Well-controlled on omeprazole  40 mg daily.  No changes today.  Will check vitamin B12 level today due to chronic use of PPI.

## 2024-01-30 NOTE — Assessment & Plan Note (Addendum)
 Patient reports debilitating pain in left thumb, previous prednisone  stopped due to allergy.  Patient unable to grasp things with left thumb.  Discussed potential for referral to sports medicine or orthopedics; patient will be contacting his orthopedic doctor. - Continue ibuprofen and Tylenol  as needed. - Continue Voltaren gel as needed. - Discussed possibility of doing an alternative steroid pack since patient was unable to tolerate the original prednisone .  Deferred per patient preference for time being. - Consider chronic daily NSAID like meloxicam if needed.

## 2024-01-30 NOTE — Assessment & Plan Note (Addendum)
 Chronic, stable/well-controlled.  Continue metformin  XR 750 mg twice daily.  No changes today. - Consider switching to Jardiance or Farxiga for kidney function, noting potential initial dip in kidney function before stabilization.  Deferred for the time being. - Check A1c every six months.

## 2024-02-01 DIAGNOSIS — Z79899 Other long term (current) drug therapy: Secondary | ICD-10-CM | POA: Diagnosis not present

## 2024-02-01 DIAGNOSIS — E782 Mixed hyperlipidemia: Secondary | ICD-10-CM | POA: Diagnosis not present

## 2024-02-01 DIAGNOSIS — E1122 Type 2 diabetes mellitus with diabetic chronic kidney disease: Secondary | ICD-10-CM | POA: Diagnosis not present

## 2024-02-01 DIAGNOSIS — E1169 Type 2 diabetes mellitus with other specified complication: Secondary | ICD-10-CM | POA: Diagnosis not present

## 2024-02-01 DIAGNOSIS — K219 Gastro-esophageal reflux disease without esophagitis: Secondary | ICD-10-CM | POA: Diagnosis not present

## 2024-02-01 DIAGNOSIS — N1831 Chronic kidney disease, stage 3a: Secondary | ICD-10-CM | POA: Diagnosis not present

## 2024-02-02 LAB — COMPREHENSIVE METABOLIC PANEL WITH GFR
ALT: 48 IU/L — ABNORMAL HIGH (ref 0–44)
AST: 30 IU/L (ref 0–40)
Albumin: 4.6 g/dL (ref 3.9–4.9)
Alkaline Phosphatase: 60 IU/L (ref 47–123)
BUN/Creatinine Ratio: 15 (ref 10–24)
BUN: 19 mg/dL (ref 8–27)
Bilirubin Total: 0.5 mg/dL (ref 0.0–1.2)
CO2: 23 mmol/L (ref 20–29)
Calcium: 9.4 mg/dL (ref 8.6–10.2)
Chloride: 101 mmol/L (ref 96–106)
Creatinine, Ser: 1.27 mg/dL (ref 0.76–1.27)
Globulin, Total: 2.5 g/dL (ref 1.5–4.5)
Glucose: 139 mg/dL — ABNORMAL HIGH (ref 70–99)
Potassium: 4.7 mmol/L (ref 3.5–5.2)
Sodium: 138 mmol/L (ref 134–144)
Total Protein: 7.1 g/dL (ref 6.0–8.5)
eGFR: 61 mL/min/1.73 (ref >59)

## 2024-02-02 LAB — LIPID PANEL
Chol/HDL Ratio: 3.9 ratio (ref 0.0–5.0)
Cholesterol, Total: 108 mg/dL (ref 100–199)
HDL: 28 mg/dL — ABNORMAL LOW (ref >39)
LDL Chol Calc (NIH): 58 mg/dL (ref 0–99)
Triglycerides: 118 mg/dL (ref 0–149)
VLDL Cholesterol Cal: 22 mg/dL (ref 5–40)

## 2024-02-02 LAB — MICROALBUMIN / CREATININE URINE RATIO
Creatinine, Urine: 250.9 mg/dL
Microalb/Creat Ratio: 28 mg/g{creat} (ref 0–29)
Microalbumin, Urine: 70.2 ug/mL

## 2024-02-02 LAB — HEMOGLOBIN A1C
Est. average glucose Bld gHb Est-mCnc: 128 mg/dL
Hgb A1c MFr Bld: 6.1 % — ABNORMAL HIGH (ref 4.8–5.6)

## 2024-02-02 LAB — VITAMIN B12: Vitamin B-12: 481 pg/mL (ref 232–1245)

## 2024-02-03 ENCOUNTER — Other Ambulatory Visit (HOSPITAL_COMMUNITY): Payer: Self-pay

## 2024-02-03 ENCOUNTER — Encounter: Payer: Self-pay | Admitting: Family Medicine

## 2024-02-03 DIAGNOSIS — H40053 Ocular hypertension, bilateral: Secondary | ICD-10-CM | POA: Diagnosis not present

## 2024-02-03 DIAGNOSIS — H43813 Vitreous degeneration, bilateral: Secondary | ICD-10-CM | POA: Diagnosis not present

## 2024-02-03 DIAGNOSIS — E119 Type 2 diabetes mellitus without complications: Secondary | ICD-10-CM | POA: Diagnosis not present

## 2024-02-03 DIAGNOSIS — H35412 Lattice degeneration of retina, left eye: Secondary | ICD-10-CM | POA: Diagnosis not present

## 2024-02-03 LAB — OPHTHALMOLOGY REPORT-SCANNED

## 2024-02-06 ENCOUNTER — Encounter: Payer: Self-pay | Admitting: Family Medicine

## 2024-02-09 ENCOUNTER — Ambulatory Visit: Payer: Self-pay | Admitting: Family Medicine

## 2024-02-22 DIAGNOSIS — Z23 Encounter for immunization: Secondary | ICD-10-CM | POA: Diagnosis not present

## 2024-02-26 ENCOUNTER — Other Ambulatory Visit: Payer: Self-pay | Admitting: Family Medicine

## 2024-02-26 DIAGNOSIS — G8929 Other chronic pain: Secondary | ICD-10-CM

## 2024-02-27 NOTE — Telephone Encounter (Signed)
 LOV- 01/30/2024 NOV- 07/30/2024 LRF- 01/30/2024 Outpatient Medication Detail   Disp Refills Start End   tiZANidine  (ZANAFLEX ) 2 MG tablet 30 tablet 1 01/30/2024 --   Sig - Route: Take 1 tablet (2 mg total) by mouth 3 (three) times daily as needed for muscle spasms. - Oral   Sent to pharmacy as: tiZANidine  (ZANAFLEX ) 2 MG tablet   E-Prescribing Status: Receipt confirmed by pharmacy (01/30/2024 12:07 PM EDT)

## 2024-03-09 ENCOUNTER — Other Ambulatory Visit: Payer: Self-pay | Admitting: Family

## 2024-03-14 ENCOUNTER — Other Ambulatory Visit: Payer: Self-pay | Admitting: Family Medicine

## 2024-03-14 ENCOUNTER — Telehealth: Payer: Self-pay

## 2024-03-14 DIAGNOSIS — N1831 Chronic kidney disease, stage 3a: Secondary | ICD-10-CM

## 2024-03-14 NOTE — Telephone Encounter (Signed)
 Copied from CRM #[id number]. Topic: Clinical - Medication Refill >> Mar 14, 2024  9:54 AM Avram MATSU wrote: Medication: Lancets JANETT CATHRYNE CORALYN VARA) MISC [[id number]]  Has the patient contacted their pharmacy? Yes (Agent: If no, request that the patient contact the pharmacy for the refill. If patient does not wish to contact the pharmacy document the reason why and proceed with request.) (Agent: If yes, when and what did the pharmacy advise?)  This is the patient's preferred pharmacy:  [facility] [address] [facility] - [place], [place] - [address] [facility] AT [address] [place] [address] Phone: [phone] Fax: [phone]  Is this the correct pharmacy for this prescription? Yes If no, delete pharmacy and type the correct one.   Has the prescription been filled recently? No  Is the patient out of the medication? Yes  Has the patient been seen for an appointment in the last year OR does the patient have an upcoming appointment? Yes  Can we respond through MyChart? Yes  Agent: Please be advised that Rx refills may take up to 3 business days. We ask that you follow-up with your pharmacy.

## 2024-03-14 NOTE — Telephone Encounter (Signed)
 Rx sent 01/30/24 for #90 and 1RF Pt advised. Verbalized understanding and reports he does remember picking them up. Only requested again because pharmacy mentioned

## 2024-03-14 NOTE — Telephone Encounter (Signed)
 Copied from CRM #[id number]. Topic: Clinical - Prescription Issue >> Mar 14, 2024  9:56 AM Avram MATSU wrote: Reason for CRM: patient stated these medication was denied by the pharmacy metoprolol  succinate (TOPROL -XL) 50 MG 24 hr tablet [[id number]] metFORMIN  (GLUCOPHAGE -XR) 750 MG 24 hr tablet [[id number]] please advise [phone]

## 2024-03-19 MED ORDER — ONETOUCH DELICA PLUS LANCET33G MISC: 1.0000 | Freq: Every day | 0 refills | Status: DC

## 2024-03-19 NOTE — Telephone Encounter (Signed)
 Requested Prescriptions  Pending Prescriptions Disp Refills   Lancets (ONETOUCH DELICA PLUS LANCET33G) MISC 100 each 0    Sig: 1 each by Does not apply route daily.     Endocrinology: Diabetes - Testing Supplies Passed - 03/19/2024 12:38 PM      Passed - Valid encounter within last 12 months    Recent Outpatient Visits           1 month ago Essential hypertension   [facility] [place], Lauraine SAILOR, DO

## 2024-03-23 NOTE — Telephone Encounter (Signed)
 Requested Prescriptions  Pending Prescriptions Disp Refills   Lancets (ONETOUCH DELICA PLUS LANCET33G) MISC [Pharmacy Med Name: ONE TOUCH DELICA PLUS 33 G LANCET] 100 each 3    Sig: USE TO TEST BLOOD GLUCOSE ONCE DAILY     There is no refill protocol information for this order

## 2024-04-18 ENCOUNTER — Ambulatory Visit: Admitting: Emergency Medicine

## 2024-04-18 VITALS — Ht 69.0 in | Wt 157.0 lb

## 2024-04-18 DIAGNOSIS — Z0000 Encounter for general adult medical examination without abnormal findings: Secondary | ICD-10-CM

## 2024-04-18 NOTE — Progress Notes (Signed)
 "  Chief Complaint  Patient presents with   Medicare Wellness     Subjective:   Joseph [name] is a 68 y.o. male who presents for a Medicare Annual Wellness Visit.  Visit info / Clinical Intake: Medicare Wellness Visit Type:: Subsequent Annual Wellness Visit Persons participating in visit and providing information:: patient Medicare Wellness Visit Mode:: Telephone If telephone:: video declined Since this visit was completed virtually, some vitals may be partially provided or unavailable. Missing vitals are due to the limitations of the virtual format.: Documented vitals are patient reported If Telephone or Video please confirm:: I connected with patient using audio/video enable telemedicine. I verified patient identity with two identifiers, discussed telehealth limitations, and patient agreed to proceed. Patient Location:: home Provider Location:: home office Interpreter Needed?: No Pre-visit prep was completed: yes AWV questionnaire completed by patient prior to visit?: yes Date:: 04/16/24 Living arrangements:: lives with spouse/significant other Patient's Overall Health Status Rating: good Typical amount of pain: some Does pain affect daily life?: no Are you currently prescribed opioids?: no  Dietary Habits and Nutritional Risks How many meals a day?: 3 Eats fruit and vegetables daily?: yes Most meals are obtained by: preparing own meals In the last 2 weeks, have you had any of the following?: none Diabetic:: (!) yes Any non-healing wounds?: no How often do you check your BS?: 1 Would you like to be referred to a Nutritionist or for Diabetic Management? : no  Functional Status Activities of Daily Living (to include ambulation/medication): Independent Ambulation: Independent with device- listed below Home Assistive Devices/Equipment: Eyeglasses (glasses for reading only) Medication Administration: Independent Home Management (perform basic housework or laundry):  Independent Manage your own finances?: yes Primary transportation is: driving Concerns about vision?: no *vision screening is required for [facility]* Concerns about hearing?: no  Fall Screening Falls in the past year?: 0 Number of falls in past year: 0 Was there an injury with Fall?: 0 Fall Risk Category Calculator: 0 Patient Fall Risk Level: Low Fall Risk  Fall Risk Patient at Risk for Falls Due to: No Fall Risks Fall risk Follow up: Falls evaluation completed  Home and Transportation Safety: All rugs have non-skid backing?: yes All stairs or steps have railings?: N/A, no stairs Grab bars in the bathtub or shower?: (!) no Have non-skid surface in bathtub or shower?: (!) no Good home lighting?: yes Regular seat belt use?: yes Hospital stays in the last year:: no  Cognitive Assessment Difficulty concentrating, remembering, or making decisions? : no Will 6CIT or Mini Cog be Completed: yes What year is it?: 0 points What month is it?: 0 points Give patient an address phrase to remember (5 components): [address] [facility] [place] About what time is it?: 0 points Count backwards from 20 to 1: 0 points Say the months of the year in reverse: 0 points Repeat the address phrase from earlier: 0 points 6 CIT Score: 0 points  Advance Directives (For Healthcare) Does Patient Have a Medical Advance Directive?: Yes Does patient want to make changes to medical advance directive?: No - Patient declined Type of Advance Directive: Healthcare Power of [place]; Living will Copy of Healthcare Power of Attorney in Chart?: No - copy requested Copy of Living Will in Chart?: No - copy requested  Reviewed/Updated  Reviewed/Updated: Reviewed All (Medical, Surgical, Family, Medications, Allergies, Care Teams, Patient Goals)    Allergies (verified) Zolpidem, Cephalexin, Keflin [cephalothin], Latex, Lisinopril, Zolpidem tartrate, Lamisil [terbinafine], and Prednisone    Current Medications  (verified) Outpatient Encounter  Medications as of 04/18/2024  Medication Sig   acetaminophen  (TYLENOL ) 325 MG tablet Take 325 mg by mouth every 6 (six) hours as needed.   ALPRAZolam  (XANAX ) 0.5 MG tablet Take 0.5 tablets (0.25 mg total) by mouth at bedtime.   aspirin  81 MG EC tablet Take 81 mg by mouth daily.    atorvastatin  (LIPITOR) 20 MG tablet Take 1 tablet (20 mg total) by mouth daily.   Blood Glucose Monitoring Suppl DEVI One touch Verio monitor Dx code: E11.59   glucose blood (ONETOUCH VERIO) test strip Use to check blood sugar once a day. Dx Code E11.9   ibuprofen (ADVIL,MOTRIN) 200 MG tablet Take 400 mg by mouth 2 (two) times daily as needed (pain).    Lancets (ONETOUCH DELICA PLUS LANCET33G) MISC 1 each by Does not apply route daily.   losartan  (COZAAR ) 100 MG tablet Take 1 tablet (100 mg total) by mouth in the morning.   metFORMIN  (GLUCOPHAGE -XR) 750 MG 24 hr tablet TAKE 2 TABLET (750 MG TOTAL) BY MOUTH DAILY WITH BREAKFAST.   metoprolol  succinate (TOPROL -XL) 50 MG 24 hr tablet Take 1 tablet (50 mg total) by mouth daily. Take with or immediately following a meal.   nitroGLYCERIN  (NITROSTAT ) 0.4 MG SL tablet Place 1 tablet (0.4 mg total) under the tongue every 5 (five) minutes as needed for chest pain.   Omega-3 Fatty Acids (FISH OIL) 1000 MG CAPS Take 1,000 mg by mouth daily. (Patient taking differently: Take 1,200 mg by mouth daily.)   omeprazole  (PRILOSEC) 40 MG capsule Take 1 capsule (40 mg total) by mouth daily.   OneTouch Delica Lancets 33G MISC 1 each by Does not apply route daily. Use to obtain sample for glucose level Dx Code E11.9   sildenafil  (VIAGRA ) 50 MG tablet Take 1 tablet (50 mg total) by mouth daily as needed for erectile dysfunction.   tiZANidine  (ZANAFLEX ) 2 MG tablet TAKE ONE TABLET BY MOUTH THREE TIMES A DAY AS NEEDED FOR MUSCLE SPASMS   traZODone  (DESYREL ) 100 MG tablet TAKE 1 AND 1/2 TABLET BY MOUTH EVERY NIGHT AT BEDTIME AS NEEDED FOR SLEEP   [DISCONTINUED]  pravastatin  (PRAVACHOL ) 40 MG tablet TAKE 1 TABLET BY MOUTH DAILY.   No facility-administered encounter medications on file as of 04/18/2024.    History: Past Medical History:  Diagnosis Date   Allergy    Anxiety    Arthritis    knees and hip   CAD (coronary artery disease)    s/p emergent 4 V CABG (4/10) w 2/4 patent grafts by cath 08/11/09, patent LIMA to LAD & SVG to OM w moderate disease native RCA & occluded grafts to RCA and diagonal   Cataract    Chest pain    secondary to sternal non-union post CABg-now s/p sternotomy w repair 03/28/09 at [facility], dr. 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 Repeat at [facility] w reported ef of 40%.    Pneumonia 2016   in past  Restless leg syndrome    Schatzki's ring    Type 2 diabetes mellitus, controlled (HCC) 6/15   type 2   Past Surgical History:  Procedure Laterality Date   CATARACT EXTRACTION W/PHACO Right 08/18/2016   Procedure: CATARACT EXTRACTION PHACO AND INTRAOCULAR LENS PLACEMENT (IOC) Right diabetic;  Surgeon: Dene Etienne, MD;  Location: [facility];  Service: Ophthalmology;  Laterality: Right;  Diabetic-oral meds poss Latex allergy   CATARACT EXTRACTION W/PHACO Left 09/15/2016   Procedure: CATARACT EXTRACTION PHACO AND INTRAOCULAR LENS PLACEMENT (IOC)  Left diabetic;  Surgeon: Etienne Dene, MD;  Location: [facility];  Service: Ophthalmology;  Laterality: Left;  Diabetic - oral meds   COLON SURGERY  2014   for perforated colon-  diverticulitis   COLONOSCOPY  2003,2010   2010 colon normal- 2003 HPP x 1    CORONARY ANGIOPLASTY  2003   stentx2   CORONARY ARTERY BYPASS GRAFT  08/16/2008   x4   ESOPHAGEAL DILATION  ~2009   Dr Luis   EYE SURGERY  Cataracts  2018?   ILEOSTOMY  2014   with reversal-- after diverticulitis. Sigmoid colectomy also. Dr Ely/Bird   INGUINAL HERNIA REPAIR Left x2   Dr Nettie   LEFT HEART CATH AND CORS/GRAFTS ANGIOGRAPHY N/A 09/27/2019   Procedure: LEFT HEART CATH AND CORS/GRAFTS ANGIOGRAPHY;  Surgeon: Verlin Lonni BIRCH, MD;  Location: [facility];  Service: Cardiovascular;  Laterality: N/A;   PILONIDAL CYST EXCISION     pilonidal sinus tract excision     POLYPECTOMY  2007   HPP x 1    STERNUM SEPARATION REPAIR  03/2009   [facility]   TONSILLECTOMY  1984   UPPER GASTROINTESTINAL ENDOSCOPY     VASECTOMY     Family History  Problem Relation Age of Onset   Heart failure Mother    Diabetes Mother    Heart disease Mother    Congestive Heart Failure Mother        cause of death   Cancer Mother        ovarian?   Heart attack Father    Heart disease Father    Heart attack Sister    Diabetes Sister    Heart attack Brother    Diabetes Brother    Cancer Brother        Bladder   Heart attack Brother    Diabetes Brother    Bladder Cancer Brother    Cancer Maternal Aunt        died of breast cancer   Breast cancer Maternal Aunt    Cancer Maternal Grandmother        died of bone cancer   Bone cancer Maternal Grandmother    Heart disease Maternal Grandfather    Heart disease Paternal Grandfather    Heart disease Brother    Heart disease Brother    Heart disease Maternal Aunt    Heart disease Paternal Uncle    Heart disease Maternal Uncle    Colon cancer Neg Hx    Colon polyps Neg Hx    Esophageal cancer Neg Hx    Rectal cancer Neg Hx    Stomach cancer Neg Hx    Social History   Occupational History   Occupation: Database administrator    Comment:  Disabled   Occupation: retired  Tobacco Use   Smoking status: Former    Current packs/day: 0.00    Average packs/day: 0.5 packs/day for 37.1 years (18.5 ttl pk-yrs)    Types: Cigarettes  Start date: 07/19/1971    Quit date: 08/15/2008    Years since quitting: 15.6   Smokeless tobacco: Never   Tobacco comments:    Quit the day of my CABGx4  Vaping Use   Vaping status: Never Used  Substance and Sexual Activity   Alcohol use: Yes    Comment: Very rarely and very small amount   Drug use: No   Sexual activity: Yes    Birth control/protection: Other-see comments    Comment: Vasectomy & spouse hysterectomy   Tobacco Counseling Counseling given: Not Answered Tobacco comments: Quit the day of my CABGx4  SDOH Screenings   Food Insecurity: No Food Insecurity (04/18/2024)  Housing: Low Risk (04/18/2024)  Transportation Needs: No Transportation Needs (04/18/2024)  Utilities: Not At Risk (04/18/2024)  Alcohol Screen: Low Risk (01/29/2024)  Depression (PHQ2-9): Low Risk (04/18/2024)  Financial Resource Strain: Low Risk (01/29/2024)  Physical Activity: Insufficiently Active (04/18/2024)  Social Connections: Moderately Integrated (04/18/2024)  Recent Concern: Social Connections - Moderately Isolated (01/29/2024)  Stress: No Stress Concern Present (04/18/2024)  Tobacco Use: Medium Risk (04/18/2024)  Health Literacy: Adequate Health Literacy (04/18/2024)   See flowsheets for full screening details  Depression Screen PHQ 2 & 9 Depression Scale- Over the past 2 weeks, how often have you been bothered by any of the following problems? Little interest or pleasure in doing things: 0 Feeling down, depressed, or hopeless (PHQ Adolescent also includes...irritable): 0 PHQ-2 Total Score: 0 Trouble falling or staying asleep, or sleeping too much: 0 Feeling tired or having little energy: 1 (rarely) Poor appetite or overeating (PHQ Adolescent also includes...weight loss): 0 Feeling bad about  yourself - or that you are a failure or have let yourself or your family down: 0 Trouble concentrating on things, such as reading the newspaper or watching television (PHQ Adolescent also includes...like school work): 0 Moving or speaking so slowly that other people could have noticed. Or the opposite - being so fidgety or restless that you have been moving around a lot more than usual: 0 Thoughts that you would be better off dead, or of hurting yourself in some way: 0 PHQ-9 Total Score: 1 If you checked off any problems, how difficult have these problems made it for you to do your work, take care of things at home, or get along with other people?: Not difficult at all  Depression Treatment Depression Interventions/Treatment : EYV7-0 Score <4 Follow-up Not Indicated; Currently on Treatment     Goals Addressed               This Visit's Progress     Increase physical activity (pt-stated)               Objective:    Today's Vitals   04/18/24 1152  Weight: 157 lb (71.2 kg)  Height: 5' 9 (1.753 m)   Body mass index is 23.18 kg/m.  Hearing/Vision screen Hearing Screening - Comments:: Denies hearing loss   Vision Screening - Comments:: UTD w/ Dr[facility] Mittie at [facility] Immunizations and Health Maintenance Health Maintenance  Topic Date Due   Pneumococcal Vaccine: 50+ Years (3 of 3 - PCV20 or PCV21) 03/16/2021   FOOT EXAM  04/05/2024   Zoster Vaccines- Shingrix (2 of 2) 05/01/2024 (Originally 06/29/2022)   COVID-19 Vaccine (7 - Moderna risk 2025-26 season) 07/04/2024   HEMOGLOBIN A1C  08/01/2024   Colonoscopy  12/26/2024   Diabetic kidney evaluation - eGFR measurement  01/31/2025   Diabetic kidney evaluation - Urine ACR  01/31/2025  OPHTHALMOLOGY EXAM  02/02/2025   Medicare Annual Wellness (AWV)  04/18/2025   DTaP/Tdap/Td (4 - Td or Tdap) 03/16/2026   Influenza Vaccine  Completed   Hepatitis C Screening  Completed   Meningococcal B Vaccine  Aged Out         Assessment/Plan:  This is a routine wellness examination for Joseph [name].  Patient Care Team: Donzella Lauraine SAILOR, DO as PCP - General (Family Medicine) Verlin Lonni BIRCH, MD as Consulting Physician (Cardiology) Mittie Gaskin, MD as Referring Physician (Ophthalmology) Magdalen Pasco RAMAN, DPM as Consulting Physician (Podiatry)  I have personally reviewed and noted the following in the patients chart:   Medical and social history Use of alcohol, tobacco or illicit drugs  Current medications and supplements including opioid prescriptions. Functional ability and status Nutritional status Physical activity Advanced directives List of other physicians Hospitalizations, surgeries, and ER visits in previous 12 months Vitals Screenings to include cognitive, depression, and falls Referrals and appointments  No orders of the defined types were placed in this encounter.  In addition, I have reviewed and discussed with patient certain preventive protocols, quality metrics, and best practice recommendations. A written personalized care plan for preventive services as well as general preventive health recommendations were provided to patient.   Vina Ned, CMA   04/18/2024   Return in 53 weeks (on 04/24/2025) for Medicare Annual Wellness Visit.  After Visit Summary: (MyChart) Due to this being a telephonic visit, the after visit summary with patients personalized plan was offered to patient via MyChart   Nurse Notes:  Needs another pneumonia vaccine Needs DM foot exam at next OV on 07/30/24 Needs 2nd Shingrix vaccine (pharmacy) Declined DM & Nutrition education referral "

## 2024-04-18 NOTE — Patient Instructions (Signed)
 Joseph [name],  Thank you for taking the time for your Medicare Wellness Visit. I appreciate your continued commitment to your health goals. Please review the care plan we discussed, and feel free to reach out if I can assist you further.  Please note that Annual Wellness Visits do not include a physical exam. Some assessments may be limited, especially if the visit was conducted virtually. If needed, we may recommend an in-person follow-up with your provider.  Ongoing Care Seeing your primary care provider every 3 to 6 months helps us  monitor your health and provide consistent, personalized care.    Referrals If a referral was made during today's visit and you haven't received any updates within two weeks, please contact the referred provider directly to check on the status.  Recommended Screenings:  You may get another pneumonia vaccine at your doctor's office.  You may get the 2nd Shingrix vaccine at your local pharmacy at your convenience.   Health Maintenance  Topic Date Due   Pneumococcal Vaccine for age over 72 (3 of 3 - PCV20 or PCV21) 03/16/2021   Medicare Annual Wellness Visit  04/05/2024   Complete foot exam   04/05/2024   Zoster (Shingles) Vaccine (2 of 2) 05/01/2024*   COVID-19 Vaccine (7 - Moderna risk 2025-26 season) 07/04/2024   Hemoglobin A1C  08/01/2024   Colon Cancer Screening  12/26/2024   Yearly kidney function blood test for diabetes  01/31/2025   Yearly kidney health urinalysis for diabetes  01/31/2025   Eye exam for diabetics  02/02/2025   DTaP/Tdap/Td vaccine (4 - Td or Tdap) 03/16/2026   Flu Shot  Completed   Hepatitis C Screening  Completed   Meningitis B Vaccine  Aged Out  *Topic was postponed. The date shown is not the original due date.       04/18/2024   12:00 PM  Advanced Directives  Does Patient Have a Medical Advance Directive? Yes  Type of Estate Agent of [place];Living will  Does patient want to make changes to  medical advance directive? No - Patient declined  Copy of Healthcare Power of Attorney in Chart? No - copy requested    Vision: Annual vision screenings are recommended for early detection of glaucoma, cataracts, and diabetic retinopathy. These exams can also reveal signs of chronic conditions such as diabetes and high blood pressure.  Dental: Annual dental screenings help detect early signs of oral cancer, gum disease, and other conditions linked to overall health, including heart disease and diabetes.  Please see the attached documents for additional preventive care recommendations.

## 2024-05-04 ENCOUNTER — Other Ambulatory Visit: Payer: Self-pay | Admitting: Family Medicine

## 2024-05-04 DIAGNOSIS — N1831 Chronic kidney disease, stage 3a: Secondary | ICD-10-CM

## 2024-05-04 MED ORDER — ONETOUCH DELICA PLUS LANCET33G MISC: 1.0000 | Freq: Every day | 0 refills | Status: AC

## 2024-05-04 NOTE — Telephone Encounter (Signed)
 Verio teststrips not on med list

## 2024-05-04 NOTE — Telephone Encounter (Signed)
 Copied from CRM [id number]. Topic: Clinical - Medication Refill >> May 04, 2024  9:27 AM Gattis SQUIBB wrote: Medication: Lancets OnetouchDelica & Verio teststrips  Has the patient contacted their pharmacy? Yes  [facility] [address] [facility] - [place], [place] - [address] AT [address] [place] [address] Phone: [phone] Fax: [phone]  Is this the correct pharmacy for this prescription? Yes If no, delete pharmacy and type the correct one.   Has the prescription been filled recently? Yes  Is the patient out of the medication? No  Has the patient been seen for an appointment in the last year OR does the patient have an upcoming appointment? Yes  Can we respond through MyChart? Yes  Agent: Please be advised that Rx refills may take up to 3 business days. We ask that you follow-up with your pharmacy.

## 2024-05-04 NOTE — Telephone Encounter (Signed)
 Requested Prescriptions  Pending Prescriptions Disp Refills   Lancets (ONETOUCH DELICA PLUS LANCET33G) MISC 100 each 0    Sig: 1 each by Does not apply route daily.     Endocrinology: Diabetes - Testing Supplies Passed - 05/04/2024  1:43 PM      Passed - Valid encounter within last 12 months    Recent Outpatient Visits           3 months ago Essential hypertension   [facility] [place], Lauraine SAILOR, DO       Future Appointments             In 3 months Hester Alm BROCKS, MD [facility]

## 2024-05-15 ENCOUNTER — Other Ambulatory Visit

## 2024-05-17 ENCOUNTER — Encounter: Admitting: Nurse Practitioner

## 2024-05-24 ENCOUNTER — Telehealth: Payer: Self-pay

## 2024-05-24 NOTE — Telephone Encounter (Signed)
 Please advise.

## 2024-05-24 NOTE — Telephone Encounter (Signed)
 Copied from CRM #[id number]. Topic: Referral - Request for Referral >> May 24, 2024 10:58 AM Darshell M wrote: Did the patient discuss referral with their provider in the last year? Yes (If No - schedule appointment) (If Yes - send message)  Appointment offered? No  Type of order/referral and detailed reason for visit: Rheumatology  Preference of office, provider, location: Dr. Lonni Ester  [facility] in [place] , [phone]  If referral order, have you been seen by this specialty before? No (If Yes, this issue or another issue? When? Where?  Can we respond through MyChart? Yes >> May 24, 2024 11:25 AM Reena NOVAK wrote: Please place Rheumatology referral for patient per his request.

## 2024-07-30 ENCOUNTER — Ambulatory Visit: Admitting: Family Medicine

## 2024-08-02 ENCOUNTER — Ambulatory Visit: Admitting: Family Medicine

## 2024-08-07 ENCOUNTER — Ambulatory Visit: Admitting: Dermatology

## 2025-01-30 ENCOUNTER — Ambulatory Visit: Admitting: Family Medicine

## 2025-04-24 ENCOUNTER — Ambulatory Visit
